# Patient Record
Sex: Male | Born: 1935 | Race: White | Hispanic: No | Marital: Married | Smoking: Former smoker
Health system: Southern US, Community
[De-identification: ages and names within clinical notes are randomized; demographics above are authoritative.]

## PROBLEM LIST (undated history)

## (undated) DIAGNOSIS — K449 Diaphragmatic hernia without obstruction or gangrene: Secondary | ICD-10-CM

## (undated) DIAGNOSIS — E785 Hyperlipidemia, unspecified: Secondary | ICD-10-CM

## (undated) DIAGNOSIS — K644 Residual hemorrhoidal skin tags: Secondary | ICD-10-CM

## (undated) DIAGNOSIS — K5730 Diverticulosis of large intestine without perforation or abscess without bleeding: Secondary | ICD-10-CM

## (undated) DIAGNOSIS — K219 Gastro-esophageal reflux disease without esophagitis: Secondary | ICD-10-CM

## (undated) DIAGNOSIS — K222 Esophageal obstruction: Secondary | ICD-10-CM

## (undated) DIAGNOSIS — I2510 Atherosclerotic heart disease of native coronary artery without angina pectoris: Secondary | ICD-10-CM

## (undated) DIAGNOSIS — I714 Abdominal aortic aneurysm, without rupture, unspecified: Secondary | ICD-10-CM

## (undated) DIAGNOSIS — E039 Hypothyroidism, unspecified: Secondary | ICD-10-CM

## (undated) HISTORY — DX: Atherosclerotic heart disease of native coronary artery without angina pectoris: I25.10

## (undated) HISTORY — PX: APPENDECTOMY: SHX54

## (undated) HISTORY — DX: Esophageal obstruction: K22.2

## (undated) HISTORY — DX: Gastro-esophageal reflux disease without esophagitis: K21.9

## (undated) HISTORY — DX: Abdominal aortic aneurysm, without rupture, unspecified: I71.40

## (undated) HISTORY — PX: ROTATOR CUFF REPAIR: SHX139

## (undated) HISTORY — PX: TOTAL KNEE ARTHROPLASTY: SHX125

## (undated) HISTORY — DX: Residual hemorrhoidal skin tags: K64.4

## (undated) HISTORY — DX: Hyperlipidemia, unspecified: E78.5

## (undated) HISTORY — DX: Hypothyroidism, unspecified: E03.9

## (undated) HISTORY — DX: Abdominal aortic aneurysm, without rupture: I71.4

## (undated) HISTORY — PX: EYE SURGERY: SHX253

## (undated) HISTORY — DX: Diverticulosis of large intestine without perforation or abscess without bleeding: K57.30

## (undated) HISTORY — DX: Diaphragmatic hernia without obstruction or gangrene: K44.9

## (undated) HISTORY — PX: INGUINAL HERNIA REPAIR: SHX194

## (undated) HISTORY — PX: NECK SURGERY: SHX720

## (undated) HISTORY — PX: ABDOMINAL AORTIC ANEURYSM REPAIR: SHX42

---

## 1998-08-07 ENCOUNTER — Encounter: Payer: Self-pay | Admitting: Cardiology

## 1998-08-07 ENCOUNTER — Ambulatory Visit (HOSPITAL_COMMUNITY): Admission: RE | Admit: 1998-08-07 | Discharge: 1998-08-08 | Payer: Self-pay | Admitting: Cardiology

## 1999-10-04 ENCOUNTER — Emergency Department (HOSPITAL_COMMUNITY): Admission: EM | Admit: 1999-10-04 | Discharge: 1999-10-05 | Payer: Self-pay

## 2000-01-04 ENCOUNTER — Emergency Department (HOSPITAL_COMMUNITY): Admission: EM | Admit: 2000-01-04 | Discharge: 2000-01-05 | Payer: Self-pay | Admitting: Emergency Medicine

## 2000-01-05 ENCOUNTER — Inpatient Hospital Stay (HOSPITAL_COMMUNITY): Admission: EM | Admit: 2000-01-05 | Discharge: 2000-01-08 | Payer: Self-pay | Admitting: Emergency Medicine

## 2000-01-06 ENCOUNTER — Encounter: Payer: Self-pay | Admitting: *Deleted

## 2004-06-01 ENCOUNTER — Ambulatory Visit: Payer: Self-pay | Admitting: Internal Medicine

## 2004-06-12 ENCOUNTER — Ambulatory Visit: Payer: Self-pay | Admitting: Internal Medicine

## 2004-06-12 ENCOUNTER — Ambulatory Visit (HOSPITAL_COMMUNITY): Admission: RE | Admit: 2004-06-12 | Discharge: 2004-06-12 | Payer: Self-pay | Admitting: Internal Medicine

## 2004-07-07 ENCOUNTER — Ambulatory Visit: Payer: Self-pay | Admitting: Cardiology

## 2004-11-09 ENCOUNTER — Ambulatory Visit: Payer: Self-pay

## 2005-05-18 ENCOUNTER — Ambulatory Visit: Payer: Self-pay | Admitting: Cardiology

## 2005-07-21 ENCOUNTER — Ambulatory Visit: Payer: Self-pay | Admitting: Cardiology

## 2006-08-01 ENCOUNTER — Ambulatory Visit: Payer: Self-pay | Admitting: Cardiology

## 2007-08-21 ENCOUNTER — Ambulatory Visit: Payer: Self-pay | Admitting: Internal Medicine

## 2007-09-04 ENCOUNTER — Ambulatory Visit: Payer: Self-pay | Admitting: Internal Medicine

## 2007-09-05 ENCOUNTER — Telehealth: Payer: Self-pay | Admitting: Internal Medicine

## 2007-09-25 ENCOUNTER — Encounter: Payer: PRIVATE HEALTH INSURANCE | Admitting: Family Medicine

## 2007-10-13 DIAGNOSIS — E039 Hypothyroidism, unspecified: Secondary | ICD-10-CM | POA: Insufficient documentation

## 2007-10-13 DIAGNOSIS — I2510 Atherosclerotic heart disease of native coronary artery without angina pectoris: Secondary | ICD-10-CM | POA: Insufficient documentation

## 2007-10-13 DIAGNOSIS — E785 Hyperlipidemia, unspecified: Secondary | ICD-10-CM | POA: Insufficient documentation

## 2007-10-13 DIAGNOSIS — K5730 Diverticulosis of large intestine without perforation or abscess without bleeding: Secondary | ICD-10-CM | POA: Insufficient documentation

## 2007-10-13 DIAGNOSIS — Z8673 Personal history of transient ischemic attack (TIA), and cerebral infarction without residual deficits: Secondary | ICD-10-CM | POA: Insufficient documentation

## 2007-10-13 DIAGNOSIS — K219 Gastro-esophageal reflux disease without esophagitis: Secondary | ICD-10-CM

## 2007-10-13 DIAGNOSIS — K222 Esophageal obstruction: Secondary | ICD-10-CM

## 2007-10-13 DIAGNOSIS — Z860100 Personal history of colon polyps, unspecified: Secondary | ICD-10-CM | POA: Insufficient documentation

## 2007-10-13 DIAGNOSIS — K449 Diaphragmatic hernia without obstruction or gangrene: Secondary | ICD-10-CM | POA: Insufficient documentation

## 2007-10-13 DIAGNOSIS — K644 Residual hemorrhoidal skin tags: Secondary | ICD-10-CM | POA: Insufficient documentation

## 2007-10-13 DIAGNOSIS — Z8679 Personal history of other diseases of the circulatory system: Secondary | ICD-10-CM | POA: Insufficient documentation

## 2007-10-17 ENCOUNTER — Ambulatory Visit: Payer: Self-pay | Admitting: Cardiology

## 2007-10-18 ENCOUNTER — Encounter: Payer: PRIVATE HEALTH INSURANCE | Admitting: Family Medicine

## 2009-01-14 ENCOUNTER — Ambulatory Visit: Payer: Self-pay | Admitting: Cardiology

## 2009-05-10 ENCOUNTER — Emergency Department: Payer: Self-pay | Admitting: Emergency Medicine

## 2010-02-02 ENCOUNTER — Ambulatory Visit: Payer: Self-pay | Admitting: Cardiology

## 2010-02-02 ENCOUNTER — Encounter: Payer: Self-pay | Admitting: Cardiology

## 2010-05-19 NOTE — Assessment & Plan Note (Signed)
Summary: f1y   Visit Type:  Follow-up Primary Provider:  Dr Leim Fabry  CC:  no complaints.  History of Present Illness: Patient has been doing pretty well.  He developed prostate cancer in February, he has been followed by the Urology team.  They have recommended a biopsy.  He is getting a PSA about every six months.  In June, he was told he had Barrett's esophagus.  He has had reflux for a long time.  He saw Dr. Joseph Art at [facility].  Not having any chest pain.  He sometimes gets lightheaded.  He fell in their rock garden and fell head first on the concrete.  That occurred in August.  Ended up seeing a neurologist at [facility], and they wanted to evaluate his walking.  He then saw ENT at [facility], and they did some positional manuevers and some of his symptoms have resolved.  Is scheduled for an MRI to evaluate by brain, and also cervical spine.    Current Medications (verified): 1)  Simvastatin 40 Mg  Tabs (Simvastatin) .... Take 1 Tablet By Mouth Once A Day 2)  Amlodipine Besylate 5 Mg  Tabs (Amlodipine Besylate) .... Take 1 Tablet By Mouth Once A Day 3)  Vitamin B-12 1000 Mcg  Tabs (Cyanocobalamin) .... Take 1 Tablet By Mouth Once A Day 4)  Aspirin 81 Mg  Tabs (Aspirin) .... Take 1 Tablet By Mouth Once A Day 5)  Levothyroxine Sodium 112 Mcg Tabs (Levothyroxine Sodium) .... Take 1 Tablet By Mouth Once A Day 6)  Captopril 25 Mg Tabs (Captopril) .... Take 1 Tablet By Mouth Two Times A Day 7)  Folic Acid 1 Mg Tabs (Folic Acid) .... 2.044mcg Daily 8)  Omeprazole 20 Mg Cpdr (Omeprazole) .... Two Times A Day  Allergies (verified): No Known Drug Allergies  Past History:  Past Medical History: Last updated: 12/28/2008 Current Problems:  CORONARY ARTERY DISEASE (ICD-414.00)-Nonobstructuive HYPERLIPIDEMIA (ICD-272.4) ABDOMINAL AORTIC ANEURYSM, HX OF (ICD-V12.50) GERD (ICD-530.81) HYPOTHYROIDISM (ICD-244.9) HIATAL HERNIA (ICD-553.3) HEMORRHOIDS, EXTERNAL (ICD-455.3) DIVERTICULOSIS OF  COLON (ICD-562.10) COLONIC POLYPS, HX OF (ICD-V12.72) Hx of ESOPHAGEAL STRICTURE (ICD-530.3)  Past Surgical History: Last updated: 12/28/2008 Right Inguinal Herniography Bilateral Total Knee Replacement Left Rotator Cuff Repair Lumbar laminectomy Appendectomy Bilateral Cataract surgery AAA Repair  Family History: Last updated: 12/28/2008 Family History of Colon Cancer: Maternal First Cousin Family History of Heart Disease:   Social History: Last updated: 12/28/2008 Patient is a former smoker.  Alcohol Use - yes-on occasion  Vital Signs:  Patient profile:   77 year old male Height:      67 inches Weight:      223 pounds BMI:     35.05 Pulse rate:   60 / minute BP sitting:   128 / 78  (left arm) Cuff size:   large  Vitals Entered By: Hardin Negus, RMA (February 02, 2010 10:50 AM)  Physical Exam  General:  Well developed, well nourished, in no acute distress. Head:  normocephalic and atraumatic Eyes:  PERRLA/EOM intact; conjunctiva and lids normal. Lungs:  Clear bilaterally to auscultation and percussion. Heart:  PMI non displaced.  Normal S1 and S2.  No def murmur.   Abdomen:  Bowel sounds positive; abdomen soft and non-tender without masses, organomegaly, or hernias noted. No hepatosplenomegaly.  No widened pulsation.   Pulses:  pulses normal in all 4 extremities Extremities:  No clubbing or cyanosis. Neurologic:  Alert and oriented x 3.   EKG  Procedure date:  02/02/2010  Findings:  NSR. WNL  Impression & Recommendations:  Problem # 1:  CORONARY ARTERY DISEASE (ICD-414.00) stable at present.  No chest pain.   His updated medication list for this problem includes:    Amlodipine Besylate 5 Mg Tabs (Amlodipine besylate) .Marland Kitchen... Take 1 tablet by mouth once a day    Aspirin 81 Mg Tabs (Aspirin) .Marland Kitchen... Take 1 tablet by mouth once a day    Captopril 25 Mg Tabs (Captopril) .Marland Kitchen... Take 1 tablet by mouth two times a day  Orders: EKG w/ Interpretation  (93000)  His updated medication list for this problem includes:    Amlodipine Besylate 5 Mg Tabs (Amlodipine besylate) .Marland Kitchen... Take 1 tablet by mouth once a day    Aspirin 81 Mg Tabs (Aspirin) .Marland Kitchen... Take 1 tablet by mouth once a day    Captopril 25 Mg Tabs (Captopril) .Marland Kitchen... Take 1 tablet by mouth two times a day  Problem # 2:  HYPERLIPIDEMIA (ICD-272.4) Is on combo of amlodipine and simva.  Has tolerated well.  Will switch to Pravastatin, just like his wife.   Have asked him to go to his primary MD in six weeks for lipid and liver.    Problem # 3:  ABDOMINAL AORTIC ANEURYSM, HX OF (ICD-V12.50) status post closure with stent graft.  Patient Instructions: 1)  Your physician recommends that you return for a FASTING Lipid and Liver Profile in 6 WEEKS with your Primary Care provider.  2)  Your physician has recommended you make the following change in your medication: STOP Simvastatin, START Pravastatin 40mg  one by mouth at bedtime  3)  Your physician wants you to follow-up in:  1 YEAR.  You will receive a reminder letter in the mail two months in advance. If you don't receive a letter, please call our office to schedule the follow-up appointment. Prescriptions: PRAVASTATIN SODIUM 40 MG TABS (PRAVASTATIN SODIUM) Take one tablet by mouth daily at bedtime  #30 x 11   Entered by:   Julieta Gutting, RN, BSN   Authorized by:   Ronaldo Miyamoto, MD, [facility]   Signed by:   Julieta Gutting, RN, BSN on 02/02/2010   Method used:   Electronically to        [facility]  [address] [address] [phone].* (retail)       [address]       [place], [place]  [address]       Ph: [phone] or [phone]       Fax: [phone]   RxID:   [id number]

## 2010-09-01 NOTE — Assessment & Plan Note (Signed)
[facility]                            CARDIOLOGY OFFICE NOTE   Bryan Howard, Bryan Howard                       MRN:          [id number]  DATE:10/17/2007                            DOB:          February 08, 1933    Bryan Howard is in for followup.  Since I last saw him, he has had his CT  followup at [facility], and told that his aneurysm is in good shape.  He has  also had evaluation with Dr. Wonda Cheng at [facility].  Apparently, Dr. Nedra Hai died.  His cholesterol was okay and his thyroid  replacement was reduced from 150 mcg to 125 mcg.  He had an epidural 3  weeks' ago and now is perhaps more ambulatory and has lost 7 pounds on a  diet.   CURRENT MEDICATIONS:  1. Levothroid 125 mcg daily.  2. Folic acid 200 mcg daily.  3. Aspirin 81 mg daily.  4. B12 1000 mcg daily.  5. Cozaar 50 mg 1-1/2 tablet b.i.d.  6. Amlodipine 5 mg daily.  7. Simvastatin 40 mg daily.   PHYSICAL EXAMINATION:  On physical, he is alert and oriented.  The  weight is 224 pounds down 15 pounds from a year ago.  The blood pressure  is 132/82 and  pulse is 68.  The lung fields are clear.  The cardiac  rhythm is regular without significant murmur noted.   LABORATORY DATA:  Electrocardiogram demonstrates normal sinus rhythm  with a rare premature ventricular contraction.   IMPRESSION:  1. Nonobstructive coronary artery disease.  2. Hypercholesterolemia.  3. Status post abdominal aortic aneurysm repair with stent graft.  4. Hypothyroidism.   PLAN:  1. Return to clinic in 1 year.  2. Continue current medical regimen.     Bryan Howard. Riley Kill, MD, [facility]  Electronically Signed    TDS/MedQ  DD: 10/17/2007  DT: 10/17/2007  Job #: [id number]   cc:   Wonda Cheng

## 2010-09-04 NOTE — Assessment & Plan Note (Signed)
[facility]                            CARDIOLOGY OFFICE NOTE   OMARION, MINNEHAN                       MRN:          [id number]  DATE:07/29/2006                            DOB:          1932-09-10    Mr. Critzer is in today for followup. He last underwent exercise  tolerance testing about a year ago. In the interim, he has had followup  of his triple A graft and there has been no evidence of leakage and  appropriate shrinking. The patient last underwent cardiac  catheterization in 2000 and at that time had scattered disease. He has  maintained on a medical regimen in the interim.   His current medications include;  1. Levothroid 100 mcg daily.  2. Folic acid 2000 mg daily.  3. Aspirin 81 mg daily.  4. B12 1000 mcg daily.  5. Cozaar 50 mg daily.  6. Caduet 20/5 daily.   On examination, his weight is 239 pounds, blood pressure 128/80, and the  pulse is 65.  LUNG FIELDS: Clear.  CARDIAC: Rhythm is regular.   The electrocardiogram demonstrates normal sinus rhythm within normal  limits.   IMPRESSION:  1. Coronary artery disease, mild by previous catheterization 2000.  2. Abdominal aortic aneurysm discovered at that time status post      aortic stent graft at [facility] with appropriate follow up.  3. Hypercholesterolemia, managed by Dr. Nedra Hai in [place] with      appropriate follow up.  4. Hypothyroidism on thyroid medication.   RECOMMENDATIONS:  1. Continue current medical regimen.  2. Return to clinic in 1 year.     Arturo Morton. Riley Kill, MD, [facility]  Electronically Signed    TDS/MedQ  DD: 08/08/2006  DT: 08/08/2006  Job #: [id number]

## 2010-09-04 NOTE — Discharge Summary (Signed)
[facility]  Patient:    Bryan Howard, Bryan Howard                       MRN: [id number] Adm. Date:  [id number] Disc. Date: [id number] Attending:  Fritzi Mandes CC:         Jodi Mourning, M.D., [place], [place]  Maisie Fus D. Riley Kill, M.D. [facility]  Tinnie Gens C. Ninetta Lights, M.D.   Discharge Summary  FINAL DIAGNOSES: 1. Cellulitis. 2. Hypertension. 3. Hypothyroidism. 4. Onychomycosis.  HISTORY OF PRESENT ILLNESS:  For details of the History of Present Illness please see my handwritten note dated January 05, 2000.  In brief, Mr. Springborn is a 77 year old man admitted with cellulitis.  He has had left great toe swelling x 36 hours with extension up his leg.  On the night prior to admission he came to the emergency room and was given Rocephin.  However, on this drug his cellulitis worsened, and he had a further extension up his leg. He had a similar problem three months go treated with antibiotics in the emergency room and then antibiotics at home.  He has some onychomycosis of his great toe.  He has also had a chigger bite which for some reason has not healed with this bite being six weeks ago.  HOSPITAL COURSE:  The patient was admitted and given IV Unasyn 1.5 g IV q.6h. We also had him elevated the left lower extremity, gave him Tylenol and Celebrex.  We continued his other home medications and added heparin for DVT prophylaxis.  On this regimen the redness gradually improved and receded.  At the time of discharge he continued to have some redness of the top of his left foot, but there was no extension up his leg, and erythema was much less intense.  We obtained and infectious disease consultation from Dr. Ninetta Lights.  He recommended treatment for the onychomycosis, will do this with terbinafine 250 mg p.o. q.d. x 12 weeks.  Will also have him see podiatry.  LABORATORY AND X-RAY DATA:  Laboratory evaluations during the hospitalization on September 18:  He had a white  count of 11.2, and his CBC was otherwise normal.  BMET on admission was also normal.  Magnification films of the left foot showed no acute bone abnormality with no evidence of fracture, subluxation, or evidence of osteomyelitis.  CONDITION ON DISCHARGE:  Improved.  DISCHARGE INSTRUCTIONS:  He is to keep the foot elevated for several days, to eat healthy, to see Dr. Dan Humphreys for follow-up, and to see a podiatrist to trim his nails.  DISCHARGE MEDICATIONS: 1. Terbinafine 250 mg one p.o. q.d. x 12 weeks. 2. Augmentin 875 mg one p.o. b.i.d. x 14 days. 3. He will continue his usual Synthroid, Norvasc, aspirin, multivitamin,    and glucosamine sulfate.  A final note to Dr. Dan Humphreys:  The patient relates that you are his doctor.  He has every confidence in you, but he does not wish to go to the emergency room in [place].  Accordingly, he came to the ER here in [place] and wound up being admitted by myself on an unassigned basis.  I suggested to him that he talk to you about his concerns about the emergency room in [place], as this might provide better continuity of his medical care there. DD:  01/08/00 TD:  01/10/00 Job: [id number]

## 2011-01-29 ENCOUNTER — Encounter: Payer: Self-pay | Admitting: Cardiology

## 2011-02-01 ENCOUNTER — Other Ambulatory Visit: Payer: Self-pay | Admitting: Cardiology

## 2011-03-05 ENCOUNTER — Encounter: Payer: Self-pay | Admitting: Cardiology

## 2011-03-09 ENCOUNTER — Encounter: Payer: Self-pay | Admitting: *Deleted

## 2011-03-10 ENCOUNTER — Ambulatory Visit (INDEPENDENT_AMBULATORY_CARE_PROVIDER_SITE_OTHER): Payer: Medicare Other | Admitting: Cardiology

## 2011-03-10 ENCOUNTER — Encounter: Payer: Self-pay | Admitting: Cardiology

## 2011-03-10 DIAGNOSIS — E7800 Pure hypercholesterolemia, unspecified: Secondary | ICD-10-CM

## 2011-03-10 DIAGNOSIS — I2510 Atherosclerotic heart disease of native coronary artery without angina pectoris: Secondary | ICD-10-CM

## 2011-03-10 DIAGNOSIS — Z8679 Personal history of other diseases of the circulatory system: Secondary | ICD-10-CM

## 2011-03-10 DIAGNOSIS — E785 Hyperlipidemia, unspecified: Secondary | ICD-10-CM

## 2011-03-10 MED ORDER — PRAVASTATIN SODIUM 80 MG PO TABS: 80.0000 mg | ORAL_TABLET | Freq: Every day | ORAL | Status: DC

## 2011-03-10 NOTE — Assessment & Plan Note (Addendum)
No recurrent angina at the present time.

## 2011-03-10 NOTE — Progress Notes (Signed)
Patient ID: Bryan Howard, male   DOB: Jan 16, 1933, 77 y.o.   MRN: [id number]

## 2011-03-10 NOTE — Progress Notes (Signed)
HPI:  He is doing well.  As he always says,  "outstanding".  He actually has no major complaints.  He is followed at [facility].  His wife does have dementia, and that is creating a lot of stress in his life.  His primary MD thought he might have some dementia.  They have moved him to [place] for follow up of his stent grafts.  No current chest pain.  Last PSA was 5.5.  Being followed in primary care and at [facility].   Current Outpatient Prescriptions  Medication Sig Dispense Refill  . amLODipine (NORVASC) 5 MG tablet Take 5 mg by mouth daily.        Marland Kitchen aspirin 81 MG tablet Take 81 mg by mouth daily.        . captopril (CAPOTEN) 25 MG tablet Take 25 mg by mouth 2 (two) times daily.       . folic acid (FOLVITE) 1 MG tablet Take 1 mg by mouth daily.        Marland Kitchen levothyroxine (SYNTHROID, LEVOTHROID) 112 MCG tablet Take 112 mcg by mouth daily.        Marland Kitchen omeprazole (PRILOSEC) 20 MG capsule Take 1 tablet by mouth Twice daily.      . pravastatin (PRAVACHOL) 80 MG tablet Take 1 tablet (80 mg total) by mouth daily.  90 tablet  3  . vitamin B-12 (CYANOCOBALAMIN) 1000 MCG tablet Take 1,000 mcg by mouth daily.          No Known Allergies  Past Medical History  Diagnosis Date  . Hyperlipidemia   . Coronary artery disease   . Abdominal aortic aneurysm   . GERD (gastroesophageal reflux disease)   . Hypothyroidism   . Hiatal hernia   . Hemorrhoids, external   . Diverticulosis of colon   . Esophageal stricture     Past Surgical History  Procedure Date  . Inguinal hernia repair     Right inguinal herniography  . Total knee arthroplasty     left   . Total knee arthroplasty     right  . Rotator cuff repair     left  . Appendectomy   . Abdominal aortic aneurysm repair   . Eye surgery     bilateral cataract surgery    Family History  Problem Relation Age of Onset  . Colon cancer Cousin     maternal 1st cousin  . Heart disease      History   Social History  . Marital Status: Married   Spouse Name: N/A    Number of Children: N/A  . Years of Education: N/A   Occupational History  . Not on file.   Social History Main Topics  . Smoking status: Former Games developer  . Smokeless tobacco: Not on file  . Alcohol Use: Yes     occasionally  . Drug Use: No  . Sexually Active:    Other Topics Concern  . Not on file   Social History Narrative  . No narrative on file    ROS: Please see the HPI.  All other systems reviewed and negative.  PHYSICAL EXAM:  BP 114/72  Pulse 63  Ht 5\' 6"  (1.676 m)  Wt 106.142 kg (234 lb)  BMI 37.77 kg/m2  General: Well developed, well nourished, in no acute distress.  Moderately obese.   Head:  Normocephalic and atraumatic. Neck: no JVD Lungs: Clear to auscultation and percussion. Heart: Normal S1 and S2.  No murmur, rubs or gallops.  Abdomen:  Normal bowel sounds; soft; non tender; no organomegaly Pulses: Pulses normal in all 4 extremities. Extremities: No clubbing or cyanosis. No edema. Neurologic: Alert and oriented x 3.  EKG:  NSR.  WNL. ASSESSMENT AND PLAN:

## 2011-03-10 NOTE — Patient Instructions (Signed)
Your physician wants you to follow-up in: 1 YEAR.  You will receive a reminder letter in the mail two months in advance. If you don't receive a letter, please call our office to schedule the follow-up appointment.  Your physician has recommended you make the following change in your medication: INCREASE Pravastatin to 80mg  once a day  Your physician recommends that you return for a FASTING lipid and liver profile in 6 WEEKS--nothing to eat or drink after midnight. (order given)

## 2011-03-10 NOTE — Assessment & Plan Note (Signed)
LDL is somewhat up.  Will increase Pravastatin to 80mg  daily, and recommend repeat liver and lipid at 6 weeks with his primary MD.

## 2011-03-10 NOTE — Assessment & Plan Note (Signed)
Regular follow up at [facility] 

## 2011-04-21 ENCOUNTER — Telehealth: Payer: Self-pay | Admitting: Cardiology

## 2011-04-21 NOTE — Telephone Encounter (Signed)
New Msg: Pt calling wanting to discuss with MD, about neurologists at [facility] changing pt medication from pravastatin to atorvastin. Pt would like to discuss this medication change with Dr. Riley Kill. Please return pt call to discuss further.

## 2011-04-21 NOTE — Telephone Encounter (Signed)
Spoke with pt, he was recently in [facility] hosp with a TIA. He reports that while he was there they sent him home on lipitor 40 mg once daily instead of pravastatin. He wanted to let dr Riley Kill know. He has a follow up with his primary care md in one week. His records are being sent to [place]. Will make dr Riley Kill aware.

## 2011-05-03 NOTE — Telephone Encounter (Signed)
Information reviewed.  TS

## 2011-09-08 DIAGNOSIS — G4733 Obstructive sleep apnea (adult) (pediatric): Secondary | ICD-10-CM | POA: Diagnosis present

## 2011-12-14 DIAGNOSIS — Z531 Procedure and treatment not carried out because of patient's decision for reasons of belief and group pressure: Secondary | ICD-10-CM | POA: Insufficient documentation

## 2011-12-28 DIAGNOSIS — E669 Obesity, unspecified: Secondary | ICD-10-CM | POA: Insufficient documentation

## 2011-12-30 DIAGNOSIS — F32A Depression, unspecified: Secondary | ICD-10-CM | POA: Insufficient documentation

## 2012-03-14 ENCOUNTER — Ambulatory Visit: Payer: Medicare Other | Admitting: Cardiology

## 2012-03-27 ENCOUNTER — Ambulatory Visit: Payer: Medicare Other | Admitting: Cardiology

## 2012-03-31 ENCOUNTER — Encounter: Payer: Self-pay | Admitting: Cardiology

## 2012-03-31 ENCOUNTER — Ambulatory Visit (INDEPENDENT_AMBULATORY_CARE_PROVIDER_SITE_OTHER): Payer: Medicare Other | Admitting: Cardiology

## 2012-03-31 VITALS — BP 116/76 | HR 66 | Ht 66.0 in | Wt 231.4 lb

## 2012-03-31 DIAGNOSIS — E785 Hyperlipidemia, unspecified: Secondary | ICD-10-CM

## 2012-03-31 DIAGNOSIS — Z8679 Personal history of other diseases of the circulatory system: Secondary | ICD-10-CM

## 2012-03-31 DIAGNOSIS — I2510 Atherosclerotic heart disease of native coronary artery without angina pectoris: Secondary | ICD-10-CM

## 2012-03-31 NOTE — Patient Instructions (Addendum)
Follow up as needed with [facility] Continue current medications Refer to medication list given to you today

## 2012-03-31 NOTE — Assessment & Plan Note (Signed)
Followed at [facility]  

## 2012-03-31 NOTE — Assessment & Plan Note (Signed)
Followed in [place].

## 2012-03-31 NOTE — Assessment & Plan Note (Signed)
He is stable, and probably only needs gen med follow up at this point.  We will pull his old chart and add that information to his baseline under overview.

## 2012-03-31 NOTE — Progress Notes (Signed)
;   HPI:  Patient seen today in a followup visit. From a cardiac standpoint he is doing extremely well. Last December, patient had double vision as he was walking in to a [employer].  He was taken by his son to [facility], where he had a full workup.  Carotids were normal at that time.  They did not find an immediate source.  From there, he has done well.  He has his primary care in [place] with a [facility] internist.  He has no exertional symptoms.  He has not lost weight unfortunately.    Current Outpatient Prescriptions  Medication Sig Dispense Refill  . amLODipine (NORVASC) 5 MG tablet Take 5 mg by mouth daily.       Marland Kitchen aspirin 81 MG tablet Take 81 mg by mouth daily.        Marland Kitchen atorvastatin (LIPITOR) 80 MG tablet Take 80 mg by mouth daily.       . folic acid (FOLVITE) 1 MG tablet Take 1 mg by mouth daily.        Marland Kitchen levothyroxine (SYNTHROID, LEVOTHROID) 112 MCG tablet Take 112 mcg by mouth daily.        Marland Kitchen losartan (COZAAR) 25 MG tablet Take 25 mg by mouth 2 (two) times daily.       Marland Kitchen omeprazole (PRILOSEC) 20 MG capsule Take 1 tablet by mouth Twice daily.      . sertraline (ZOLOFT) 50 MG tablet Take 50 mg by mouth daily.       . Tamsulosin HCl (FLOMAX) 0.4 MG CAPS 0.4 mg daily.       . vitamin B-12 (CYANOCOBALAMIN) 1000 MCG tablet Take 1,000 mcg by mouth daily.          No Known Allergies  Past Medical History  Diagnosis Date  . Hyperlipidemia   . Coronary artery disease   . Abdominal aortic aneurysm   . GERD (gastroesophageal reflux disease)   . Hypothyroidism   . Hiatal hernia   . Hemorrhoids, external   . Diverticulosis of colon   . Esophageal stricture     Past Surgical History  Procedure Date  . Inguinal hernia repair     Right inguinal herniography  . Total knee arthroplasty     left   . Total knee arthroplasty     right  . Rotator cuff repair     left  . Appendectomy   . Abdominal aortic aneurysm repair   . Eye surgery     bilateral cataract surgery    Family History    Problem Relation Age of Onset  . Colon cancer Cousin     maternal 1st cousin  . Heart disease      History   Social History  . Marital Status: Married    Spouse Name: N/A    Number of Children: N/A  . Years of Education: N/A   Occupational History  . Not on file.   Social History Main Topics  . Smoking status: Former Games developer  . Smokeless tobacco: Not on file  . Alcohol Use: Yes     Comment: occasionally  . Drug Use: No  . Sexually Active:    Other Topics Concern  . Not on file   Social History Narrative  . No narrative on file    ROS: Please see the HPI.  All other systems reviewed and negative.  PHYSICAL EXAM:  BP 116/76  Pulse 66  Ht 5\' 6"  (1.676 m)  Wt 231 lb 6.4 oz (  104.962 kg)  BMI 37.35 kg/m2  SpO2 93%  General: Well developed, well nourished, in no acute distress. Head:  Normocephalic and atraumatic. Neck: no JVD.  No carotid bruits.   Lungs: Clear to auscultation and percussion. Heart: Normal S1 and S2.  1/6 SEM.  No DM.   Abdomen:  Normal bowel sounds; soft; non tender; no organomegaly Pulses: Pulses normal in all 4 extremities. Extremities: No clubbing or cyanosis. No edema. Neurologic: Alert and oriented x 3.  EKG:  NSR. WNL.    ASSESSMENT AND PLAN:

## 2012-09-20 ENCOUNTER — Encounter: Payer: Self-pay | Admitting: Cardiology

## 2014-01-11 ENCOUNTER — Encounter: Payer: Self-pay | Admitting: Internal Medicine

## 2014-10-26 ENCOUNTER — Encounter: Payer: Self-pay | Admitting: General Practice

## 2014-10-26 ENCOUNTER — Other Ambulatory Visit: Payer: Self-pay

## 2014-10-26 ENCOUNTER — Emergency Department: Payer: Medicare Other

## 2014-10-26 ENCOUNTER — Emergency Department
Admission: EM | Admit: 2014-10-26 | Discharge: 2014-10-26 | Disposition: A | Discharge status: Home / Self Care | Payer: Medicare Other | Attending: Emergency Medicine | Admitting: Emergency Medicine

## 2014-10-26 DIAGNOSIS — R5383 Other fatigue: Secondary | ICD-10-CM | POA: Diagnosis not present

## 2014-10-26 DIAGNOSIS — R200 Anesthesia of skin: Secondary | ICD-10-CM | POA: Diagnosis not present

## 2014-10-26 DIAGNOSIS — R29898 Other symptoms and signs involving the musculoskeletal system: Secondary | ICD-10-CM

## 2014-10-26 DIAGNOSIS — M549 Dorsalgia, unspecified: Secondary | ICD-10-CM | POA: Diagnosis not present

## 2014-10-26 DIAGNOSIS — M6281 Muscle weakness (generalized): Secondary | ICD-10-CM | POA: Insufficient documentation

## 2014-10-26 DIAGNOSIS — R531 Weakness: Secondary | ICD-10-CM | POA: Diagnosis present

## 2014-10-26 DIAGNOSIS — G8929 Other chronic pain: Secondary | ICD-10-CM | POA: Diagnosis not present

## 2014-10-26 DIAGNOSIS — Z87891 Personal history of nicotine dependence: Secondary | ICD-10-CM | POA: Insufficient documentation

## 2014-10-26 LAB — CBC
HEMATOCRIT: 38.9 % — AB (ref 40.0–52.0)
HEMOGLOBIN: 13 g/dL (ref 13.0–18.0)
MCH: 33.2 pg (ref 26.0–34.0)
MCHC: 33.5 g/dL (ref 32.0–36.0)
MCV: 99.2 fL (ref 80.0–100.0)
Platelets: 196 10*3/uL (ref 150–440)
RBC: 3.93 MIL/uL — AB (ref 4.40–5.90)
RDW: 14.7 % — ABNORMAL HIGH (ref 11.5–14.5)
WBC: 7.3 10*3/uL (ref 3.8–10.6)

## 2014-10-26 LAB — BASIC METABOLIC PANEL
ANION GAP: 9 (ref 5–15)
BUN: 14 mg/dL (ref 6–20)
CHLORIDE: 104 mmol/L (ref 101–111)
CO2: 26 mmol/L (ref 22–32)
Calcium: 9 mg/dL (ref 8.9–10.3)
Creatinine, Ser: 0.78 mg/dL (ref 0.61–1.24)
Glucose, Bld: 118 mg/dL — ABNORMAL HIGH (ref 65–99)
POTASSIUM: 3.7 mmol/L (ref 3.5–5.1)
Sodium: 139 mmol/L (ref 135–145)

## 2014-10-26 LAB — URINALYSIS COMPLETE WITH MICROSCOPIC (ARMC ONLY)
Bacteria, UA: NONE SEEN
Bilirubin Urine: NEGATIVE
Glucose, UA: NEGATIVE mg/dL
Hgb urine dipstick: NEGATIVE
Ketones, ur: NEGATIVE mg/dL
Leukocytes, UA: NEGATIVE
Nitrite: NEGATIVE
PH: 7 (ref 5.0–8.0)
PROTEIN: NEGATIVE mg/dL
Specific Gravity, Urine: 1.018 (ref 1.005–1.030)

## 2014-10-26 LAB — TROPONIN I: Troponin I: <0.03 ng/mL (ref <0.031)

## 2014-10-26 NOTE — ED Provider Notes (Signed)
[facility] Emergency Department Provider Note   ____________________________________________  Time seen: 3:30 PM I have reviewed the triage vital signs and the triage nursing note.  HISTORY  Chief Complaint Weakness and Fatigue   Historian Patient  HPI Bryan CoganLloyd N Trebilcock Jr. is a 81 y.o. male who is here for evaluation of right leg dragging when he walks. He states this has been going on for about 4 days. He goes back even further and states that he has been feeling generalized fatigue for about 3-4 weeks including episodes of vertigo/vestibular disease per the patient. He is followed bya primary care physician Dr. Dayna BarkerAldridge, and sees a spine physician at [facility], and has a neurologist as well. He denies chest pain or shortness of breath or any recent sinus congestion or fevers. He states he has a chronic cough without sputum production. No urinary symptoms. No abdominal pain.    Past Medical History  Diagnosis Date  . Hyperlipidemia   . Coronary artery disease   . Abdominal aortic aneurysm   . GERD (gastroesophageal reflux disease)   . Hypothyroidism   . Hiatal hernia   . Hemorrhoids, external   . Diverticulosis of colon   . Esophageal stricture     Patient Active Problem List   Diagnosis Date Noted  . HYPOTHYROIDISM 10/13/2007  . HYPERLIPIDEMIA 10/13/2007  . CORONARY ARTERY DISEASE 10/13/2007  . HEMORRHOIDS, EXTERNAL 10/13/2007  . ESOPHAGEAL STRICTURE 10/13/2007  . GERD 10/13/2007  . HIATAL HERNIA 10/13/2007  . DIVERTICULOSIS OF COLON 10/13/2007  . ABDOMINAL AORTIC ANEURYSM, HX OF 10/13/2007  . COLONIC POLYPS, HX OF 10/13/2007    Past Surgical History  Procedure Laterality Date  . Inguinal hernia repair      Right inguinal herniography  . Total knee arthroplasty      left   . Total knee arthroplasty      right  . Rotator cuff repair      left  . Appendectomy    . Abdominal aortic aneurysm repair    . Eye surgery      bilateral cataract  surgery    Current Outpatient Rx  Name  Route  Sig  Dispense  Refill  . amLODipine (NORVASC) 5 MG tablet   Oral   Take 5 mg by mouth daily.          Marland Kitchen. aspirin 81 MG tablet   Oral   Take 81 mg by mouth daily.           Marland Kitchen. atorvastatin (LIPITOR) 80 MG tablet   Oral   Take 80 mg by mouth daily.          . folic acid (FOLVITE) 1 MG tablet   Oral   Take 1 mg by mouth daily.           Marland Kitchen. levothyroxine (SYNTHROID, LEVOTHROID) 112 MCG tablet   Oral   Take 112 mcg by mouth daily.           Marland Kitchen. losartan (COZAAR) 25 MG tablet   Oral   Take 25 mg by mouth 2 (two) times daily.          Marland Kitchen. omeprazole (PRILOSEC) 20 MG capsule   Oral   Take 1 tablet by mouth Twice daily.         . sertraline (ZOLOFT) 50 MG tablet   Oral   Take 50 mg by mouth daily.          . Tamsulosin HCl (FLOMAX) 0.4 MG CAPS  0.4 mg daily.          . vitamin B-12 (CYANOCOBALAMIN) 1000 MCG tablet   Oral   Take 1,000 mcg by mouth daily.             Allergies Review of patient's allergies indicates no known allergies.  Family History  Problem Relation Age of Onset  . Colon cancer Cousin     maternal 1st cousin  . Heart disease      Social History History  Substance Use Topics  . Smoking status: Former Games developer  . Smokeless tobacco: Not on file  . Alcohol Use: No     Comment: occasionally    Review of Systems  Constitutional: Negative for fever. Eyes: Negative for visual changes. ENT: Negative for sore throat. Cardiovascular: Negative for chest pain. Respiratory: Negative for shortness of breath. Gastrointestinal: Negative for abdominal pain, vomiting and diarrhea. Genitourinary: Negative for dysuria. Musculoskeletal: Positive for chronic back pain. Skin: Negative for rash. Neurological: Negative for headache. 10 point Review of Systems otherwise negative ____________________________________________   PHYSICAL EXAM:  VITAL SIGNS: ED Triage Vitals  Enc Vitals Group      BP 10/26/14 1326 117/75 mmHg     Pulse Rate 10/26/14 1326 96     Resp --      Temp 10/26/14 1326 98.1 F (36.7 C)     Temp Source 10/26/14 1326 Oral     SpO2 10/26/14 1326 95 %     Weight 10/26/14 1326 220 lb (99.791 kg)     Height 10/26/14 1326  (1.676 m)     Head Cir --      Peak Flow --      Pain Score 10/26/14 1326 9     Pain Loc --      Pain Edu? --      Excl. in GC? --      Constitutional: Alert and oriented. Well appearing and in no distress. Eyes: Conjunctivae are normal. PERRL. Normal extraocular movements. ENT   Head: Normocephalic and atraumatic.   Nose: No congestion/rhinnorhea.   Mouth/Throat: Mucous membranes are moist.   Neck: No stridor. Cardiovascular/Chest: Normal rate, regular rhythm.  No murmurs, rubs, or gallops. Respiratory: Normal respiratory effort without tachypnea nor retractions. Breath sounds are clear and equal bilaterally. No wheezes/rales/rhonchi. Gastrointestinal: Soft. No distention, no guarding, no rebound. Obese. Nontender.  Genitourinary/rectal:Deferred Musculoskeletal: Nontender with normal range of motion in all extremities. No joint effusions.  No lower extremity tenderness nor edema. Neurologic:  Normal speech and language. 5 Out of 5 strength in bilateral upper extremities. 4 out of 5 strength in bilateral lower extremities.. Cranial nerves II through X are intact. Sensation normal except for paresthesia of the right lower extremity Skin:  Skin is warm, dry and intact. No rash noted. Psychiatric: Mood and affect are normal. Speech and behavior are normal. Patient exhibits appropriate insight and judgment.  ____________________________________________   EKG I, Governor Rooks, MD, the attending physician have personally viewed and interpreted all ECGs.  Normal sinus rhythm. 93 bpm. Narrow QRS. Normal axis. Normal ST and T-wave. ____________________________________________  LABS (pertinent positives/negatives)  CBC  within normal limits Metabolic panel within normal limits Troponin less than 0.03 Urinalysis negative  ____________________________________________  RADIOLOGY All Xrays were viewed by me. Imaging interpreted by Radiologist.  CT head noncontrast: Negative __________________________________________  PROCEDURES  Procedure(s) performed: None Critical Care performed: None  ____________________________________________   ED COURSE / ASSESSMENT AND PLAN  CONSULTATIONS: None  Pertinent labs & imaging results that were  available during my care of the patient were reviewed by me and considered in my medical decision making (see chart for details).   Patient is complaining of several weeks of generalized complaints including fatigue and lower extremity weakness including episodes of vertigo. On exam he does have some weakness of both legs but paracentesis to just the right lower extremity. I evaluated with a CT scan there is no evidence of intracranial abnormality including a stroke. His symptoms have been ongoing at least for the past 4-5 days if not longer up to about 4 weeks ago. His laboratory evaluation is reassuring. He is able to walk with his walker which is his baseline. I am going to go ahead and discharge him home. He understands return precautions and is to follow with his primary care physician next week. He is going try to get an appointment with his neurologist sooner than his September appointment.  Patient / Family / Caregiver informed of clinical course, medical decision-making process, and agree with plan.   I discussed return precautions, follow-up instructions, and discharged instructions with patient and/or family.  ___________________________________________   FINAL CLINICAL IMPRESSION(S) / ED DIAGNOSES   Final diagnoses:  Numbness in right leg  Bilateral leg weakness    FOLLOW UP  Referred to: Primary care physician Dr. Delorse Limber, and his  neurologist.   Governor Rooks, MD 10/26/14 [phone]

## 2014-10-26 NOTE — Discharge Instructions (Signed)
No certain cause was found for your fatigue, and bilateral leg weakness, however your exam and evaluation are reassuring. We discussed you should try to get appointment with your neurologist for next available appointment. Follow up with your primary care doctor this week. Return to the emergency department for any new or worsening condition including any confusion or altered mental status, chest pain, shortness of breath, one-sided weakness, or any new numbness, or slurred speech.

## 2014-10-26 NOTE — ED Notes (Signed)
Pt sleeping soundly.

## 2014-10-26 NOTE — ED Notes (Signed)
Pt arrived to ed from home with reports of experiencing bodyaches and generalized weakness x 3-4 weeks. Pt verbalized increase stress at home. Alert and Oriented. Denies specific pain at this time.  Pt denies injuries.

## 2015-07-08 ENCOUNTER — Encounter: Payer: Self-pay | Admitting: Podiatry

## 2015-07-08 ENCOUNTER — Ambulatory Visit (INDEPENDENT_AMBULATORY_CARE_PROVIDER_SITE_OTHER): Payer: Medicare Other | Admitting: Podiatry

## 2015-07-08 ENCOUNTER — Ambulatory Visit (INDEPENDENT_AMBULATORY_CARE_PROVIDER_SITE_OTHER): Payer: Medicare Other

## 2015-07-08 VITALS — BP 129/78 | HR 94 | Resp 18

## 2015-07-08 DIAGNOSIS — M779 Enthesopathy, unspecified: Secondary | ICD-10-CM | POA: Diagnosis not present

## 2015-07-08 DIAGNOSIS — G629 Polyneuropathy, unspecified: Secondary | ICD-10-CM | POA: Diagnosis not present

## 2015-07-08 DIAGNOSIS — M79676 Pain in unspecified toe(s): Secondary | ICD-10-CM | POA: Diagnosis not present

## 2015-07-08 DIAGNOSIS — R52 Pain, unspecified: Secondary | ICD-10-CM | POA: Diagnosis not present

## 2015-07-08 DIAGNOSIS — B351 Tinea unguium: Secondary | ICD-10-CM | POA: Diagnosis not present

## 2015-07-08 NOTE — Patient Instructions (Signed)
If the pain to your feet is not resolved in 3-4 weeks, please call the office for follow-up. Otherwise, I will see you back in 3 months for nail trimming.  It was nice to meet you today. If you have any questions, please feel free to contact me.   Peripheral Neuropathy Peripheral neuropathy is a type of nerve damage. It affects nerves that carry signals between the spinal cord and other parts of the body. These are called peripheral nerves. With peripheral neuropathy, one nerve or a group of nerves may be damaged.  CAUSES  Many things can damage peripheral nerves. For some people with peripheral neuropathy, the cause is unknown. Some causes include:  Diabetes. This is the most common cause of peripheral neuropathy.  Injury to a nerve.  Pressure or stress on a nerve that lasts a long time.  Too little vitamin B. Alcoholism can lead to this.  Infections.  Autoimmune diseases, such as multiple sclerosis and systemic lupus erythematosus.  Inherited nerve diseases.  Some medicines, such as cancer drugs.  Toxic substances, such as lead and mercury.  Too little blood flowing to the legs.  Kidney disease.  Thyroid disease. SIGNS AND SYMPTOMS  Different people have different symptoms. The symptoms you have will depend on which of your nerves is damaged. Common symptoms include:  Loss of feeling (numbness) in the feet and hands.  Tingling in the feet and hands.  Pain that burns.  Very sensitive skin.  Weakness.  Not being able to move a part of the body (paralysis).  Muscle twitching.  Clumsiness or poor coordination.  Loss of balance.  Not being able to control your bladder.  Feeling dizzy.  Sexual problems. DIAGNOSIS  Peripheral neuropathy is a symptom, not a disease. Finding the cause of peripheral neuropathy can be hard. To figure that out, your health care provider will take a medical history and do a physical exam. A neurological exam will also be done. This  involves checking things affected by your brain, spinal cord, and nerves (nervous system). For example, your health care provider will check your reflexes, how you move, and what you can feel.  Other types of tests may also be ordered, such as:  Blood tests.  A test of the fluid in your spinal cord.  Imaging tests, such as CT scans or an MRI.  Electromyography (EMG). This test checks the nerves that control muscles.  Nerve conduction velocity tests. These tests check how fast messages pass through your nerves.  Nerve biopsy. A small piece of nerve is removed. It is then checked under a microscope. TREATMENT   Medicine is often used to treat peripheral neuropathy. Medicines may include:  Pain-relieving medicines. Prescription or over-the-counter medicine may be suggested.  Antiseizure medicine. This may be used for pain.  Antidepressants. These also may help ease pain from neuropathy.  Lidocaine. This is a numbing medicine. You might wear a patch or be given a shot.  Mexiletine. This medicine is typically used to help control irregular heart rhythms.  Surgery. Surgery may be needed to relieve pressure on a nerve or to destroy a nerve that is causing pain.  Physical therapy to help movement.  Assistive devices to help movement. HOME CARE INSTRUCTIONS   Only take over-the-counter or prescription medicines as directed by your health care provider. Follow the instructions carefully for any given medicines. Do not take any other medicines without first getting approval from your health care provider.  If you have diabetes, work closely with  your health care provider to keep your blood sugar under control.  If you have numbness in your feet:  Check every day for signs of injury or infection. Watch for redness, warmth, and swelling.  Wear padded socks and comfortable shoes. These help protect your feet.  Do not do things that put pressure on your damaged nerve.  Do not smoke.  Smoking keeps blood from getting to damaged nerves.  Avoid or limit alcohol. Too much alcohol can cause a lack of B vitamins. These vitamins are needed for healthy nerves.  Develop a good support system. Coping with peripheral neuropathy can be stressful. Talk to a mental health specialist or join a support group if you are struggling.  Follow up with your health care provider as directed. SEEK MEDICAL CARE IF:   You have new signs or symptoms of peripheral neuropathy.  You are struggling emotionally from dealing with peripheral neuropathy.  You have a fever. SEEK IMMEDIATE MEDICAL CARE IF:   You have an injury or infection that is not healing.  You feel very dizzy or begin vomiting.  You have chest pain.  You have trouble breathing.   This information is not intended to replace advice given to you by your health care provider. Make sure you discuss any questions you have with your health care provider.   Document Released: 03/26/2002 Document Revised: 12/16/2010 Document Reviewed: 12/11/2012 Elsevier Interactive Patient Education [employer]

## 2015-07-08 NOTE — Progress Notes (Signed)
   Subjective:    Patient ID: Bryan CoganLloyd N Cappucci Jr., male    DOB: 11-01-1932, 82 y.o.   MRN: [id number]  HPI  82 year old male presents the office today for concerns of painful, elongated, thickened toenails which she cannot trim himself. Denies any swelling redness or drainage. He is also starting to have pain in the outside part of both of his feet of the last couple weeks. This pain started after he started on physical therapy. He has not done much walking over the last year as his back surgeons. As he started increase his activities were to have some pain. No recent injury or trauma. Gets some occasional swelling to his feet. He also has neuropathy. No other complaints.    Review of Systems  All other systems reviewed and are negative.      Objective:   Physical Exam General: AAO x3, NAD  Dermatological: Nails hypertrophic, dystrophic, brittle, discolored, elongated 10. There is no swelling erythema or drainage. There is tenderness nails 1-5 bilaterally. There is dry xerotic skin bilaterally.  Vascular: Dorsalis Pedis artery and Posterior Tibial artery pedal pulses are 2/4 bilateral with immedate capillary fill time. Pedal hair growth present. No varicosities and no lower extremity edema present bilateral. There is no pain with calf compression, swelling, warmth, erythema.   Neruologic:Sensation decreased with Dorann OuSimms Weinstein monofilament, decreased vibratory sensation.  Musculoskeletal: There is tenderness on the course the peroneal tendons just proximal to the fifth metatarsal base and inferior to the lateral malleolus bilaterally. Tendons appear to be intact. No areas of pinpoint bony tenderness. Ankle, subtalar joint range of motion intact.  Gait: Unassisted, Nonantalgic.      Assessment & Plan:  82 year old male with symptomatic onychomycosis, tendinitis -Treatment options discussed including all alternatives, risks, and complications -X-rays were obtained and reviewed with the  patient. No evidence of acute fracture.  -Nails debrided 10 without complications or bleeding -PCP for swelling.  -Provided prescription for physical therapy. I with the patient and he is getting is due to increased activity. Starting therapy with last couple weeks. Start rehabilitation and strengthening exercises. -Follow up in 3-4 weeks of the tendinitis continues to be bothersome or sooner if any issues are to arise. Otherwise follow-up in 3 months.  Ovid CurdMatthew Derrica Sieg, DPM

## 2015-10-09 ENCOUNTER — Encounter: Payer: Self-pay | Admitting: Podiatry

## 2015-10-09 ENCOUNTER — Ambulatory Visit (INDEPENDENT_AMBULATORY_CARE_PROVIDER_SITE_OTHER): Payer: Medicare Other | Admitting: Podiatry

## 2015-10-09 DIAGNOSIS — B351 Tinea unguium: Secondary | ICD-10-CM | POA: Diagnosis not present

## 2015-10-09 DIAGNOSIS — M79676 Pain in unspecified toe(s): Secondary | ICD-10-CM

## 2015-10-09 NOTE — Progress Notes (Signed)
Patient ID: Bryan CoganLloyd N Whistler Jr., male   DOB: 04/30/1932, 82 y.o.   MRN: [id number]  Subjective: 82 y.o. returns the office today for painful, elongated, thickened toenails which he cannot trim himself. Denies any redness or drainage around the nails. Denies any acute changes since last appointment and no new complaints today. States the tendonitis he had before has resolved. Denies any systemic complaints such as fevers, chills, nausea, vomiting.   Objective: AAO 3, NAD DP/PT pulses palpable, CRT less than 3 seconds Nails hypertrophic, dystrophic, elongated, brittle, discolored 10. There is tenderness overlying the nails 1-5 bilaterally. There is no surrounding erythema or drainage along the nail sites. No open lesions or pre-ulcerative lesions are identified. No other areas of tenderness bilateral lower extremities. No overlying edema, erythema, increased warmth. No pain with calf compression, swelling, warmth, erythema.  Assessment: Patient presents with symptomatic onychomycosis  Plan: -Treatment options including alternatives, risks, complications were discussed -Nails sharply debrided 10 without complication/bleeding. -Discussed daily foot inspection. If there are any changes, to call the office immediately.  -Follow-up in 3 months or sooner if any problems are to arise. In the meantime, encouraged to call the office with any questions, concerns, changes symptoms.  Bryan CurdMatthew Linzy Howard, DPM

## 2016-01-13 ENCOUNTER — Ambulatory Visit: Payer: Medicare Other | Admitting: Podiatry

## 2016-01-23 ENCOUNTER — Ambulatory Visit (INDEPENDENT_AMBULATORY_CARE_PROVIDER_SITE_OTHER): Payer: Medicare Other | Admitting: Podiatry

## 2016-01-23 ENCOUNTER — Encounter: Payer: Self-pay | Admitting: Podiatry

## 2016-01-23 DIAGNOSIS — M79609 Pain in unspecified limb: Principal | ICD-10-CM

## 2016-01-23 DIAGNOSIS — L608 Other nail disorders: Secondary | ICD-10-CM

## 2016-01-23 DIAGNOSIS — M79676 Pain in unspecified toe(s): Secondary | ICD-10-CM

## 2016-01-23 DIAGNOSIS — B351 Tinea unguium: Secondary | ICD-10-CM

## 2016-01-23 DIAGNOSIS — G609 Hereditary and idiopathic neuropathy, unspecified: Secondary | ICD-10-CM | POA: Diagnosis not present

## 2016-01-23 DIAGNOSIS — L603 Nail dystrophy: Secondary | ICD-10-CM

## 2016-01-24 NOTE — Progress Notes (Signed)
SUBJECTIVE Patient  presents to office today complaining of elongated, thickened nails. Pain while ambulating in shoes. Patient is unable to trim their own nails.  Patient also has a complaint of neuropathy to the bilateral lower extremities which create burning sensations especially at night.  OBJECTIVE General Patient is awake, alert, and oriented x 3 and in no acute distress. Derm Skin is dry and supple bilateral. Negative open lesions or macerations. Remaining integument unremarkable. Nails are tender, long, thickened and dystrophic with subungual debris, consistent with onychomycosis, 1-5 bilateral. No signs of infection noted. Vasc  DP and PT pedal pulses palpable bilaterally. Temperature gradient within normal limits.  Neuro Epicritic and protective threshold sensation diminished bilaterally.  Musculoskeletal Exam No symptomatic pedal deformities noted bilateral. Muscular strength within normal limits.  ASSESSMENT 1. Onychodystrophic nails 1-5 bilateral with hyperkeratosis of nails.  2. Onychomycosis of nail due to dermatophyte bilateral 3. Pain in foot bilateral 4. 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  PLAN OF CARE 1. Patient evaluated today.  2. Instructed to maintain good pedal hygiene and foot care.  3. Mechanical debridement of nails 1-5 bilaterally performed using a nail nipper. Filed with dremel without incident.  4. Today prescription for anti-inflammatory neuropathic cream was dispensed through [facility]. Return to clinic in 3 mos.    Felecia ShellingBrent M Alma Mohiuddin, DPM

## 2016-03-19 DIAGNOSIS — K8050 Calculus of bile duct without cholangitis or cholecystitis without obstruction: Secondary | ICD-10-CM | POA: Insufficient documentation

## 2016-04-27 ENCOUNTER — Ambulatory Visit: Payer: Medicare Other | Admitting: Podiatry

## 2016-06-18 DIAGNOSIS — M7062 Trochanteric bursitis, left hip: Secondary | ICD-10-CM | POA: Insufficient documentation

## 2016-06-18 DIAGNOSIS — M7061 Trochanteric bursitis, right hip: Secondary | ICD-10-CM | POA: Insufficient documentation

## 2016-06-21 ENCOUNTER — Ambulatory Visit (INDEPENDENT_AMBULATORY_CARE_PROVIDER_SITE_OTHER): Payer: Medicare Other | Admitting: Podiatry

## 2016-06-21 ENCOUNTER — Encounter: Payer: Self-pay | Admitting: Podiatry

## 2016-06-21 DIAGNOSIS — M79609 Pain in unspecified limb: Secondary | ICD-10-CM

## 2016-06-21 DIAGNOSIS — B351 Tinea unguium: Secondary | ICD-10-CM | POA: Diagnosis not present

## 2016-06-21 NOTE — Progress Notes (Signed)
Complaint:  Visit Type: Patient returns to my office for continued preventative foot care services. Complaint: Patient states" my nails have grown long and thick and become painful to walk and wear shoes" . The patient presents for preventative foot care services. No changes to ROS  Podiatric Exam: Vascular: dorsalis pedis and posterior tibial pulses are palpable bilateral. Capillary return is immediate. Temperature gradient is WNL. Skin turgor WNL  Sensorium: Normal Semmes Weinstein monofilament test. Normal tactile sensation bilaterally. Nail Exam: Pt has thick disfigured discolored nails with subungual debris noted bilateral entire nail hallux through fifth toenails Ulcer Exam: There is no evidence of ulcer or pre-ulcerative changes or infection. Orthopedic Exam: Muscle tone and strength are WNL. No limitations in general ROM. No crepitus or effusions noted. Foot type and digits show no abnormalities. Bony prominences are unremarkable. Skin: No Porokeratosis. No infection or ulcers  Diagnosis:  Onychomycosis, , Pain in right toe, pain in left toes  Treatment & Plan Procedures and Treatment: Consent by patient was obtained for treatment procedures. The patient understood the discussion of treatment and procedures well. All questions were answered thoroughly reviewed. Debridement of mycotic and hypertrophic toenails, 1 through 5 bilateral and clearing of subungual debris. No ulceration, no infection noted.  Return Visit-Office Procedure: Patient instructed to return to the office for a follow up visit 3 months   for continued evaluation and treatment.    Shaunak Kreis DPM 

## 2016-06-22 DIAGNOSIS — M5416 Radiculopathy, lumbar region: Secondary | ICD-10-CM | POA: Insufficient documentation

## 2016-07-16 DIAGNOSIS — M1611 Unilateral primary osteoarthritis, right hip: Secondary | ICD-10-CM | POA: Insufficient documentation

## 2016-08-20 DIAGNOSIS — M6281 Muscle weakness (generalized): Secondary | ICD-10-CM | POA: Insufficient documentation

## 2016-09-27 ENCOUNTER — Ambulatory Visit (INDEPENDENT_AMBULATORY_CARE_PROVIDER_SITE_OTHER): Payer: Medicare Other | Admitting: Podiatry

## 2016-09-27 ENCOUNTER — Encounter: Payer: Self-pay | Admitting: Podiatry

## 2016-09-27 DIAGNOSIS — B351 Tinea unguium: Secondary | ICD-10-CM

## 2016-09-27 DIAGNOSIS — M79609 Pain in unspecified limb: Secondary | ICD-10-CM | POA: Diagnosis not present

## 2016-09-27 DIAGNOSIS — G609 Hereditary and idiopathic neuropathy, unspecified: Secondary | ICD-10-CM

## 2016-09-27 NOTE — Progress Notes (Signed)
Complaint:  Visit Type: Patient returns to my office for continued preventative foot care services. Complaint: Patient states" my nails have grown long and thick and become painful to walk and wear shoes.   The patient presents for preventative foot care services. No changes to ROS.  Patient says he experiences numbness extending from ankles to toes both feet.  Podiatric Exam: Vascular: dorsalis pedis and posterior tibial pulses are palpable bilateral. Capillary return is immediate. Temperature gradient is WNL. Skin turgor WNL  Sensorium: Absent  Semmes Weinstein monofilament test. Normal tactile sensation bilaterally. Nail Exam: Pt has thick disfigured discolored nails with subungual debris noted bilateral entire nail hallux through fifth toenails Ulcer Exam: There is no evidence of ulcer or pre-ulcerative changes or infection. Orthopedic Exam: Muscle tone and strength are WNL. No limitations in general ROM. No crepitus or effusions noted. Foot type and digits show no abnormalities. Bony prominences are unremarkable. Skin: No Porokeratosis. No infection or ulcers  Diagnosis:  Onychomycosis, , Pain in right toe, pain in left toes Neuropathy.   Treatment & Plan Procedures and Treatment: Consent by patient was obtained for treatment procedures. The patient understood the discussion of treatment and procedures well. All questions were answered thoroughly reviewed. Debridement of mycotic and hypertrophic toenails, 1 through 5 bilateral and clearing of subungual debris. No ulceration, no infection noted. Told him to discuss neuropathy treatment with his medical doctor. Return Visit-Office Procedure: Patient instructed to return to the office for a follow up visit 3 months for continued evaluation and treatment.    Helane GuntherGregory Kihanna Kamiya DPM

## 2016-09-29 DIAGNOSIS — H903 Sensorineural hearing loss, bilateral: Secondary | ICD-10-CM | POA: Insufficient documentation

## 2016-09-29 DIAGNOSIS — H905 Unspecified sensorineural hearing loss: Secondary | ICD-10-CM | POA: Insufficient documentation

## 2016-09-29 DIAGNOSIS — R42 Dizziness and giddiness: Secondary | ICD-10-CM | POA: Insufficient documentation

## 2017-01-03 ENCOUNTER — Encounter: Payer: Self-pay | Admitting: Podiatry

## 2017-01-03 ENCOUNTER — Ambulatory Visit (INDEPENDENT_AMBULATORY_CARE_PROVIDER_SITE_OTHER): Payer: Medicare Other | Admitting: Podiatry

## 2017-01-03 DIAGNOSIS — M79609 Pain in unspecified limb: Secondary | ICD-10-CM | POA: Diagnosis not present

## 2017-01-03 DIAGNOSIS — B351 Tinea unguium: Secondary | ICD-10-CM

## 2017-01-03 DIAGNOSIS — G609 Hereditary and idiopathic neuropathy, unspecified: Secondary | ICD-10-CM

## 2017-01-03 NOTE — Progress Notes (Signed)
Complaint:  Visit Type: Patient returns to my office for continued preventative foot care services. Complaint: Patient states" my nails have grown long and thick and become painful to walk and wear shoes.   The patient presents for preventative foot care services. No changes to ROS.    Podiatric Exam: Vascular: dorsalis pedis and posterior tibial pulses are palpable bilateral. Capillary return is immediate. Temperature gradient is WNL. Skin turgor WNL  Sensorium: Absent  Semmes Weinstein monofilament test. Normal tactile sensation bilaterally. Nail Exam: Pt has thick disfigured discolored nails with subungual debris noted bilateral entire nail hallux through fifth toenails Ulcer Exam: There is no evidence of ulcer or pre-ulcerative changes or infection. Orthopedic Exam: Muscle tone and strength are WNL. No limitations in general ROM. No crepitus or effusions noted. Foot type and digits show no abnormalities. Bony prominences are unremarkable. Skin: No Porokeratosis. No infection or ulcers  Diagnosis:  Onychomycosis, , Pain in right toe, pain in left toes Neuropathy.   Treatment & Plan Procedures and Treatment: Consent by patient was obtained for treatment procedures. The patient understood the discussion of treatment and procedures well. All questions were answered thoroughly reviewed. Debridement of mycotic and hypertrophic toenails, 1 through 5 bilateral and clearing of subungual debris. No ulceration, no infection noted.  Return Visit-Office Procedure: Patient instructed to return to the office for a follow up visit 3 months for continued evaluation and treatment.    Helane Gunther DPM

## 2017-04-04 ENCOUNTER — Ambulatory Visit: Payer: Medicare Other | Admitting: Podiatry

## 2017-04-07 ENCOUNTER — Ambulatory Visit: Payer: Medicare Other | Admitting: Podiatry

## 2017-04-18 ENCOUNTER — Ambulatory Visit: Payer: Medicare Other | Admitting: Podiatry

## 2017-06-06 ENCOUNTER — Encounter: Payer: Self-pay | Admitting: Podiatry

## 2017-06-06 ENCOUNTER — Ambulatory Visit (INDEPENDENT_AMBULATORY_CARE_PROVIDER_SITE_OTHER): Payer: Medicare Other | Admitting: Podiatry

## 2017-06-06 DIAGNOSIS — M79609 Pain in unspecified limb: Secondary | ICD-10-CM

## 2017-06-06 DIAGNOSIS — G609 Hereditary and idiopathic neuropathy, unspecified: Secondary | ICD-10-CM

## 2017-06-06 DIAGNOSIS — B351 Tinea unguium: Secondary | ICD-10-CM | POA: Diagnosis not present

## 2017-06-06 NOTE — Progress Notes (Signed)
Complaint:  Visit Type: Patient returns to my office for continued preventative foot care services. Complaint: Patient states" my nails have grown long and thick and become painful to walk and wear shoes.   The patient presents for preventative foot care services. No changes to ROS.    Podiatric Exam: Vascular: dorsalis pedis and posterior tibial pulses are palpable bilateral. Capillary return is immediate. Temperature gradient is WNL. Skin turgor WNL  Sensorium: Absent  Semmes Weinstein monofilament test. Normal tactile sensation bilaterally. Nail Exam: Pt has thick disfigured discolored nails with subungual debris noted bilateral entire nail hallux through fifth toenails Ulcer Exam: There is no evidence of ulcer or pre-ulcerative changes or infection. Orthopedic Exam: Muscle tone and strength are WNL. No limitations in general ROM. No crepitus or effusions noted. Foot type and digits show no abnormalities. Bony prominences are unremarkable. Skin: No Porokeratosis. No infection or ulcers  Diagnosis:  Onychomycosis, , Pain in right toe, pain in left toes Neuropathy.   Treatment & Plan Procedures and Treatment: Consent by patient was obtained for treatment procedures. The patient understood the discussion of treatment and procedures well. All questions were answered thoroughly reviewed. Debridement of mycotic and hypertrophic toenails, 1 through 5 bilateral and clearing of subungual debris. No ulceration, no infection noted. ABN signed for 2019. Return Visit-Office Procedure: Patient instructed to return to the office for a follow up visit 3 months for continued evaluation and treatment.    Helane GuntherGregory Keirstyn Aydt DPM

## 2017-07-27 ENCOUNTER — Encounter: Payer: Self-pay | Admitting: Internal Medicine

## 2017-08-05 DIAGNOSIS — M19011 Primary osteoarthritis, right shoulder: Secondary | ICD-10-CM | POA: Insufficient documentation

## 2017-09-05 ENCOUNTER — Ambulatory Visit: Payer: Medicare Other | Admitting: Podiatry

## 2017-12-11 DIAGNOSIS — M51369 Other intervertebral disc degeneration, lumbar region without mention of lumbar back pain or lower extremity pain: Secondary | ICD-10-CM | POA: Insufficient documentation

## 2017-12-11 DIAGNOSIS — I10 Essential (primary) hypertension: Secondary | ICD-10-CM | POA: Diagnosis present

## 2017-12-11 DIAGNOSIS — K4090 Unilateral inguinal hernia, without obstruction or gangrene, not specified as recurrent: Secondary | ICD-10-CM | POA: Insufficient documentation

## 2017-12-19 ENCOUNTER — Other Ambulatory Visit: Payer: Self-pay

## 2017-12-19 ENCOUNTER — Emergency Department
Admission: EM | Admit: 2017-12-19 | Discharge: 2017-12-19 | Disposition: A | Discharge status: Home / Self Care | Payer: Medicare Other | Attending: Emergency Medicine | Admitting: Emergency Medicine

## 2017-12-19 ENCOUNTER — Emergency Department: Payer: Medicare Other

## 2017-12-19 ENCOUNTER — Emergency Department: Payer: Self-pay

## 2017-12-19 DIAGNOSIS — I2510 Atherosclerotic heart disease of native coronary artery without angina pectoris: Secondary | ICD-10-CM | POA: Diagnosis not present

## 2017-12-19 DIAGNOSIS — Z7982 Long term (current) use of aspirin: Secondary | ICD-10-CM | POA: Diagnosis not present

## 2017-12-19 DIAGNOSIS — Z79899 Other long term (current) drug therapy: Secondary | ICD-10-CM | POA: Diagnosis not present

## 2017-12-19 DIAGNOSIS — Z87891 Personal history of nicotine dependence: Secondary | ICD-10-CM | POA: Diagnosis not present

## 2017-12-19 DIAGNOSIS — R52 Pain, unspecified: Secondary | ICD-10-CM | POA: Insufficient documentation

## 2017-12-19 DIAGNOSIS — E039 Hypothyroidism, unspecified: Secondary | ICD-10-CM | POA: Diagnosis not present

## 2017-12-19 DIAGNOSIS — Z96643 Presence of artificial hip joint, bilateral: Secondary | ICD-10-CM | POA: Diagnosis not present

## 2017-12-19 DIAGNOSIS — Z95828 Presence of other vascular implants and grafts: Secondary | ICD-10-CM | POA: Insufficient documentation

## 2017-12-19 MED ORDER — SODIUM CHLORIDE 0.9% FLUSH
10.0000 mL | Freq: Two times a day (BID) | INTRAVENOUS | Status: DC
  Administered 2017-12-19: 10 mL

## 2017-12-19 MED ORDER — SODIUM CHLORIDE 0.9 % IV SOLN
2.0000 g | Freq: Once | INTRAVENOUS | Status: AC
  Administered 2017-12-19: 2 g via INTRAVENOUS
  Filled 2017-12-19: qty 2

## 2017-12-19 MED ORDER — SODIUM CHLORIDE 0.9% FLUSH: 10.0000 mL | INTRAVENOUS | Status: DC | PRN

## 2017-12-19 NOTE — ED Notes (Signed)
Pt transported back to [facility] via ems. Pt verbalized understanding discharge instructions.

## 2017-12-19 NOTE — ED Triage Notes (Signed)
Pt from [facility] via ems for reinsertion of picc line, pt refused to let mobile[facility] reinsert at [facility] later today. PICC line still inserted at this time, no bleeding at that site. NAD, pt ambulatory from stretcher to stretcher

## 2017-12-19 NOTE — ED Provider Notes (Signed)
[facility] Emergency Department Provider Note  Time seen: 11:23 AM  I have reviewed the triage vital signs and the nursing notes.   HISTORY  Chief Complaint No chief complaint on file.    HPI Bryan Howard. is a 84 y.o. male with a past medical history of CAD, gastric reflux, hyperlipidemia, status post diverticulitis currently on IV antibiotics via PICC line presents to the emergency department for dislodged PICC line.  According to the patient he had an x-ray performed as his PICC line had been pulled out a little bit.  He states the x-ray showed that the PICC line needed to be exchanged.  Patient is currently prescribed cefepime 2 g IV daily.  States his last dose was Saturday he missed yesterday.  Patient also takes oral Flagyl per record review.  Patient denies any abdominal pain.  States pain in all of his joints and back but states that is chronic.  Denies any fever.   Past Medical History:  Diagnosis Date  . Abdominal aortic aneurysm (HCC)   . Coronary artery disease   . Diverticulosis of colon   . Esophageal stricture   . GERD (gastroesophageal reflux disease)   . Hemorrhoids, external   . Hiatal hernia   . Hyperlipidemia   . Hypothyroidism     Patient Active Problem List   Diagnosis Date Noted  . Disequilibrium 09/29/2016  . 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 Generalized muscle weakness 08/20/2016  . Primary osteoarthritis of right hip 07/16/2016  . Right lumbar radiculitis 06/22/2016  . Greater trochanteric bursitis of left hip 06/18/2016  . Greater trochanteric bursitis of right hip 06/18/2016  . Choledocholithiasis 03/19/2016  . Obesity, unspecified 12/28/2011  . HYPOTHYROIDISM 10/13/2007  . HYPERLIPIDEMIA 10/13/2007  . CORONARY ARTERY DISEASE 10/13/2007  . HEMORRHOIDS, EXTERNAL 10/13/2007  . ESOPHAGEAL STRICTURE 10/13/2007  . GERD 10/13/2007  . HIATAL HERNIA 10/13/2007  . DIVERTICULOSIS OF COLON 10/13/2007  .  ABDOMINAL AORTIC ANEURYSM, HX OF 10/13/2007  . COLONIC POLYPS, HX OF 10/13/2007    Past Surgical History:  Procedure Laterality Date  . ABDOMINAL AORTIC ANEURYSM REPAIR    . APPENDECTOMY    . EYE SURGERY     bilateral cataract surgery  . INGUINAL HERNIA REPAIR     Right inguinal herniography  . NECK SURGERY     fuse vertabrae   . ROTATOR CUFF REPAIR     left  . TOTAL KNEE ARTHROPLASTY     left   . TOTAL KNEE ARTHROPLASTY     right    Prior to Admission medications   Medication Sig Start Date End Date Taking? Authorizing Provider  Alpha-Lipoic Acid 300 MG CAPS Take by mouth.    [provider]  amLODipine (NORVASC) 5 MG tablet Take by mouth. 09/09/16 09/09/17  [provider]  ascorbic acid (SM VIT C/ROSE HIPS) 1000 MG tablet Take by mouth.    [provider]  ascorbic acid (VITAMIN C) 100 MG tablet Take by mouth.    [provider]  aspirin EC 81 MG tablet Take by mouth.    [provider]  atorvastatin (LIPITOR) 80 MG tablet Take by mouth. 12/14/16 05/19/17  [provider]  Cholecalciferol (VITAMIN D-1000 MAX ST) 1000 units tablet Take by mouth.    [provider]  Coenzyme Q-10 200 MG CAPS Take by mouth.    [provider]  cyanocobalamin ([employer] VITAMIN B12) 1000 MCG tablet Take by mouth. 03/05/09   [provider]  DULoxetine (CYMBALTA) 30 MG capsule Take by mouth. 09/09/16 08/26/17  [provider]  etodolac (LODINE) 200 MG capsule TAKE 1 CAPSULE (200 MG TOTAL) BY MOUTH 2 (TWO) TIMES DAILY. 06/19/15   [provider]  gabapentin (NEURONTIN) 300 MG capsule Take by mouth. 09/09/16 03/04/17  [provider]  levothyroxine (SYNTHROID, LEVOTHROID) 112 MCG tablet Take by mouth. 09/09/16 06/04/17  [provider]  losartan (COZAAR) 25 MG tablet Take by mouth. 08/17/16 08/17/17  [provider]  omeprazole (PRILOSEC) 20 MG capsule Take by mouth. 12/23/16 03/24/17  [provider]  sertraline (ZOLOFT) 50 MG tablet Take 50 mg by mouth daily.  01/14/12   [provider]  Tamsulosin HCl (FLOMAX) 0.4 MG CAPS 0.4 mg daily.  02/20/12   [provider]  vitamin B-12 (CYANOCOBALAMIN) 1000 MCG tablet Take 1,000 mcg by mouth daily.      [provider]    Allergies  Allergen Reactions  . Captopril Cough    Family History  Problem Relation Age of Onset  . Colon cancer Cousin        maternal 1st cousin  . Heart disease Unknown     Social History Social History   Tobacco Use  . Smoking status: Former Games developer  . Smokeless tobacco: Never Used  Substance Use Topics  . Alcohol use: No    Comment: occasionally  . Drug use: No    Review of Systems Constitutional: Negative for fever. Cardiovascular: Negative for chest pain. Respiratory: Negative for shortness of breath. Gastrointestinal: Negative for abdominal pain.  Positive for constipation. Musculoskeletal: Chronic back and joint pain All other ROS negative  ____________________________________________   PHYSICAL EXAM:  VITAL SIGNS: ED Triage Vitals  Enc Vitals Group     BP 12/19/17 1111 113/65     Pulse Rate 12/19/17 1111 72     Resp --      Temp 12/19/17 1111 98.1 F (36.7 C)     Temp Source 12/19/17 1111 Oral     SpO2 12/19/17 1111 (!) 89 %     Weight 12/19/17 1100 205 lb 14.4 oz (93.4 kg)     Height 12/19/17 1100 5\' 6"  (1.676 m)     Head Circumference --      Peak Flow --      Pain Score 12/19/17 1059 7     Pain Loc --      Pain Edu? --      Excl. in GC? --    Constitutional: Alert and oriented. Well appearing and in no distress. Eyes: Normal exam ENT   Head: Normocephalic and atraumatic   Mouth/Throat: Mucous membranes are moist. Cardiovascular: Normal rate, regular rhythm.  Respiratory: Normal respiratory effort without tachypnea nor retractions. Breath sounds are clear Gastrointestinal: Soft and nontender. No distention.   Musculoskeletal:  Nontender with normal range of motion in all extremities.  Right upper extremity PICC line. Neurologic:  Normal speech and language. No gross focal neurologic deficits  Skin:  Skin is warm, dry and intact.  Psychiatric: Mood and affect are normal.   ____________________________________________   INITIAL IMPRESSION / ASSESSMENT AND PLAN / ED COURSE  Pertinent labs & imaging results that were available during my care of the patient were reviewed by me and considered in my medical decision making (see chart for details).  Patient presents to the emergency department for a dislodged PICC line hoping to get a PIC catheter exchange.  Per record review patient was diagnosed with diverticulitis needs  to be on IV antibiotics through 12/26/2017.  As the patient has 1 more week of IV antibiotics we will attempt to replace the PICC line in the emergency department have the patient received his 2 g of cefepime and hopefully discharge back to his nursing facility.  Patient has no fever, afebrile with normal vitals in the emergency department denies any abdominal pain.  IV team was able to exchange the patient's PICC line.   Patient finishing a cefepime infusion and will be discharged back to his nursing facility.  Pulse ox recorded at 89% however when I saw the patient he continues to have a great waveform at 96%.  ____________________________________________   FINAL CLINICAL IMPRESSION(S) / ED DIAGNOSES  PICC line dislodgment   Minna Antis, MD 12/19/17 1520

## 2017-12-19 NOTE — Progress Notes (Signed)
Peripherally Inserted Central Catheter/Midline Placement  The IV Nurse has discussed with the patient and/or persons authorized to consent for the patient, the purpose of this procedure and the potential benefits and risks involved with this procedure.  The benefits include less needle sticks, lab draws from the catheter, and the patient may be discharged home with the catheter. Risks include, but not limited to, infection, bleeding, blood clot (thrombus formation), and puncture of an artery; nerve damage and irregular heartbeat and possibility to perform a PICC exchange if needed/ordered by physician.  Alternatives to this procedure were also discussed.  Bard Power PICC patient education guide, fact sheet on infection prevention and patient information card has been provided to patient /or left at bedside.    PICC/Midline Placement Documentation  PICC Single Lumen 12/19/17 PICC Right Basilic 40 cm (Active)  Indication for Insertion or Continuance of Line Home intravenous therapies (PICC only) 12/19/2017  2:00 PM  Exposed Catheter (cm) 0 cm 12/19/2017  2:00 PM  Site Assessment Clean;Dry;Intact 12/19/2017  2:00 PM  Line Status Flushed;Blood return noted 12/19/2017  2:00 PM  Dressing Type Transparent 12/19/2017  2:00 PM  Dressing Status Clean;Dry;Intact;Antimicrobial disc in place 12/19/2017  2:00 PM  Dressing Intervention New dressing 12/19/2017  2:00 PM  Dressing Change Due 12/26/17 12/19/2017  2:00 PM       Stacie Glaze Horton 12/19/2017, 2:20 PM

## 2017-12-19 NOTE — Accreditation Note (Signed)
PICC line discontinue per MD order. Took out 40 cm, catheter intact.No bleeding noted to insertion site.Upper arm with old bruised area. Dsg inplace. Patient tolerated the procedure. RN aware.

## 2017-12-19 NOTE — ED Notes (Signed)
[facility]  CALLED  FOR  TRANSPORT  TO  [facility] 

## 2017-12-25 ENCOUNTER — Other Ambulatory Visit
Admission: RE | Admit: 2017-12-25 | Discharge: 2017-12-25 | Disposition: A | Discharge status: Home / Self Care | Payer: Medicare Other | Source: Ambulatory Visit | Attending: Family Medicine | Admitting: Family Medicine

## 2017-12-25 DIAGNOSIS — K5792 Diverticulitis of intestine, part unspecified, without perforation or abscess without bleeding: Secondary | ICD-10-CM | POA: Insufficient documentation

## 2017-12-25 LAB — COMPREHENSIVE METABOLIC PANEL
ALBUMIN: 3.2 g/dL — AB (ref 3.5–5.0)
ALK PHOS: 64 U/L (ref 38–126)
ALT: 40 U/L (ref 0–44)
ANION GAP: 4 — AB (ref 5–15)
AST: 25 U/L (ref 15–41)
BILIRUBIN TOTAL: 0.6 mg/dL (ref 0.3–1.2)
BUN: 18 mg/dL (ref 8–23)
CALCIUM: 8.9 mg/dL (ref 8.9–10.3)
CO2: 30 mmol/L (ref 22–32)
Chloride: 103 mmol/L (ref 98–111)
Creatinine, Ser: 0.77 mg/dL (ref 0.61–1.24)
GFR calc Af Amer: >60 mL/min (ref >60)
GFR calc non Af Amer: >60 mL/min (ref >60)
GLUCOSE: 104 mg/dL — AB (ref 70–99)
Potassium: 4.4 mmol/L (ref 3.5–5.1)
SODIUM: 137 mmol/L (ref 135–145)
Total Protein: 5.6 g/dL — ABNORMAL LOW (ref 6.5–8.1)

## 2017-12-25 LAB — CBC WITH DIFFERENTIAL/PLATELET
BAND NEUTROPHILS: 1 %
BASOS ABS: 0.1 10*3/uL (ref 0–0.1)
BASOS PCT: 1 %
Blasts: 0 %
EOS ABS: 0.4 10*3/uL (ref 0–0.7)
Eosinophils Relative: 5 %
HEMATOCRIT: 33.9 % — AB (ref 40.0–52.0)
HEMOGLOBIN: 12.1 g/dL — AB (ref 13.0–18.0)
LYMPHS PCT: 29 %
Lymphs Abs: 2.3 10*3/uL (ref 1.0–3.6)
MCH: 36 pg — ABNORMAL HIGH (ref 26.0–34.0)
MCHC: 35.6 g/dL (ref 32.0–36.0)
MCV: 101 fL — ABNORMAL HIGH (ref 80.0–100.0)
MONO ABS: 0.3 10*3/uL (ref 0.2–1.0)
MYELOCYTES: 0 %
Metamyelocytes Relative: 0 %
Monocytes Relative: 4 %
Neutro Abs: 4.9 10*3/uL (ref 1.4–6.5)
Neutrophils Relative %: 60 %
Other: 0 %
PROMYELOCYTES RELATIVE: 0 %
Platelets: 215 10*3/uL (ref 150–440)
RBC: 3.35 MIL/uL — ABNORMAL LOW (ref 4.40–5.90)
RDW: 14.1 % (ref 11.5–14.5)
WBC: 8 10*3/uL (ref 3.8–10.6)
nRBC: 0 /100 WBC

## 2018-01-25 ENCOUNTER — Ambulatory Visit: Payer: Medicare Other | Attending: Neurology

## 2018-01-25 DIAGNOSIS — Z8673 Personal history of transient ischemic attack (TIA), and cerebral infarction without residual deficits: Secondary | ICD-10-CM | POA: Diagnosis present

## 2018-01-25 DIAGNOSIS — G4733 Obstructive sleep apnea (adult) (pediatric): Secondary | ICD-10-CM | POA: Diagnosis present

## 2018-10-10 IMAGING — DX DG CHEST 1V PORT
1 series · 1 of 1 positions shown · non-contrast
Comparison: Chest radiograph 05/10/2009

CLINICAL DATA: Evaluate PICC line.

EXAM:
PORTABLE CHEST 1 VIEW

[chest ap]
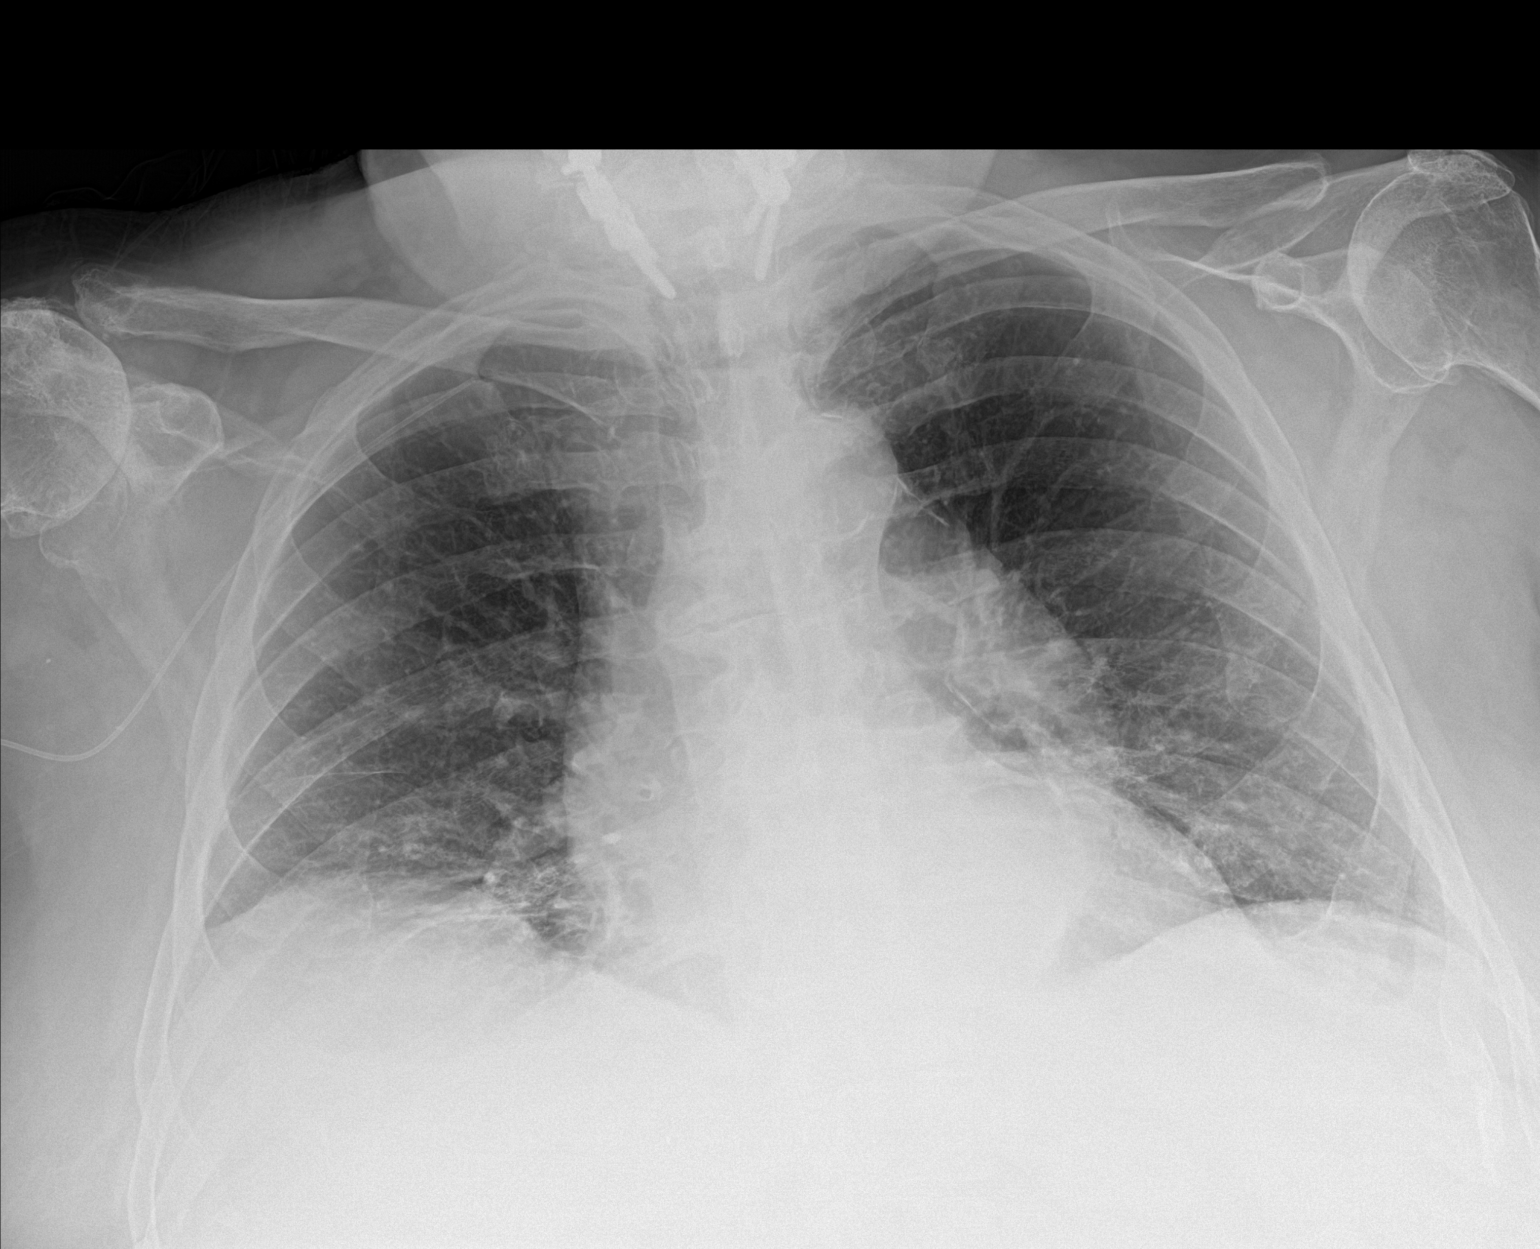

[1 of 1 positions shown; findings below may reference images not displayed]

FINDINGS: Stable cardiac contours. Tortuosity of the thoracic aorta. Aortic
calcification. Elevation right hemidiaphragm. Right basilar
atelectasis. No pleural effusion or pneumothorax. Right upper
extremity PICC line tip projects at the mid subclavian vein.
IMPRESSION: Right upper extremity PICC line tip projects at the mid subclavian
vein, consider advancement.

## 2019-09-19 DIAGNOSIS — Z8661 Personal history of infections of the central nervous system: Secondary | ICD-10-CM | POA: Insufficient documentation

## 2019-09-19 DIAGNOSIS — Z8739 Personal history of other diseases of the musculoskeletal system and connective tissue: Secondary | ICD-10-CM | POA: Insufficient documentation

## 2019-10-01 DIAGNOSIS — R5383 Other fatigue: Secondary | ICD-10-CM | POA: Insufficient documentation

## 2019-10-01 DIAGNOSIS — N453 Epididymo-orchitis: Secondary | ICD-10-CM | POA: Insufficient documentation

## 2022-01-07 ENCOUNTER — Other Ambulatory Visit: Payer: Self-pay | Admitting: Pediatrics

## 2022-01-07 DIAGNOSIS — N3001 Acute cystitis with hematuria: Secondary | ICD-10-CM

## 2022-01-07 DIAGNOSIS — N5089 Other specified disorders of the male genital organs: Secondary | ICD-10-CM

## 2022-01-07 DIAGNOSIS — R310 Gross hematuria: Secondary | ICD-10-CM

## 2022-01-14 ENCOUNTER — Ambulatory Visit
Admission: RE | Admit: 2022-01-14 | Discharge: 2022-01-14 | Disposition: A | Discharge status: Home / Self Care | Payer: Medicare Other | Source: Ambulatory Visit | Attending: Pediatrics | Admitting: Pediatrics

## 2022-01-14 DIAGNOSIS — N5089 Other specified disorders of the male genital organs: Secondary | ICD-10-CM | POA: Diagnosis present

## 2022-01-14 DIAGNOSIS — N3001 Acute cystitis with hematuria: Secondary | ICD-10-CM | POA: Insufficient documentation

## 2022-01-14 DIAGNOSIS — R310 Gross hematuria: Secondary | ICD-10-CM | POA: Insufficient documentation

## 2022-09-29 DIAGNOSIS — Z86711 Personal history of pulmonary embolism: Secondary | ICD-10-CM | POA: Diagnosis present

## 2022-09-29 DIAGNOSIS — I2699 Other pulmonary embolism without acute cor pulmonale: Secondary | ICD-10-CM | POA: Insufficient documentation

## 2022-11-10 DIAGNOSIS — I509 Heart failure, unspecified: Secondary | ICD-10-CM | POA: Insufficient documentation

## 2022-11-10 DIAGNOSIS — J449 Chronic obstructive pulmonary disease, unspecified: Secondary | ICD-10-CM | POA: Diagnosis present

## 2022-11-10 DIAGNOSIS — F321 Major depressive disorder, single episode, moderate: Secondary | ICD-10-CM | POA: Insufficient documentation

## 2022-11-19 DIAGNOSIS — W19XXXA Unspecified fall, initial encounter: Secondary | ICD-10-CM | POA: Insufficient documentation

## 2022-11-26 DIAGNOSIS — Z85828 Personal history of other malignant neoplasm of skin: Secondary | ICD-10-CM | POA: Insufficient documentation

## 2022-12-30 ENCOUNTER — Emergency Department: Payer: Medicare Other

## 2022-12-30 ENCOUNTER — Emergency Department
Admission: EM | Admit: 2022-12-30 | Discharge: 2022-12-30 | Disposition: A | Discharge status: Home / Self Care | Payer: Medicare Other | Attending: Emergency Medicine | Admitting: Emergency Medicine

## 2022-12-30 ENCOUNTER — Other Ambulatory Visit: Payer: Self-pay

## 2022-12-30 DIAGNOSIS — Z7901 Long term (current) use of anticoagulants: Secondary | ICD-10-CM | POA: Diagnosis not present

## 2022-12-30 DIAGNOSIS — Z86711 Personal history of pulmonary embolism: Secondary | ICD-10-CM | POA: Insufficient documentation

## 2022-12-30 DIAGNOSIS — S0990XA Unspecified injury of head, initial encounter: Secondary | ICD-10-CM | POA: Diagnosis present

## 2022-12-30 DIAGNOSIS — M25552 Pain in left hip: Secondary | ICD-10-CM | POA: Diagnosis not present

## 2022-12-30 DIAGNOSIS — M25551 Pain in right hip: Secondary | ICD-10-CM | POA: Diagnosis not present

## 2022-12-30 DIAGNOSIS — Z8546 Personal history of malignant neoplasm of prostate: Secondary | ICD-10-CM | POA: Insufficient documentation

## 2022-12-30 DIAGNOSIS — I2510 Atherosclerotic heart disease of native coronary artery without angina pectoris: Secondary | ICD-10-CM | POA: Insufficient documentation

## 2022-12-30 DIAGNOSIS — W19XXXA Unspecified fall, initial encounter: Secondary | ICD-10-CM

## 2022-12-30 DIAGNOSIS — M25559 Pain in unspecified hip: Secondary | ICD-10-CM

## 2022-12-30 NOTE — Discharge Instructions (Signed)
You were seen in the ER today for evaluation after fall.  Fortunately your testing did not show any serious injuries.  Please be careful when you are getting around in your home.  Return to the ER for new or worsening symptoms.

## 2022-12-30 NOTE — ED Notes (Signed)
Caregiver at bedside, able to take pt home. D/c instructions reviewed

## 2022-12-30 NOTE — ED Provider Notes (Signed)
[facility] Provider Note    Event Date/Time   First MD Initiated Contact with Patient 12/30/22 [phone]     (approximate)   History   Fall   HPI  Bryan Howard. is a 89 year old male with history of prostate cancer, peripheral neuropathy, CAD, OSA, PE on Eliquis presenting to the emergency department for evaluation after a fall.  Patient thinks he fell asleep in his electric scooter last night.  He fell forward into his mattress with his head and woke up.  He then lowered himself to the ground as he was having worsening neuropathy symptoms in his legs.  He was having some worsening pain leading him to present to the ER.  Denies chest pain, abdominal pain, back pain.  No new numbness, tingling, focal weakness.  Otherwise reports he has been feeling at his baseline without complaints.    Physical Exam   Triage Vital Signs: ED Triage Vitals  Encounter Vitals Group     BP 12/30/22 0857 (!) 103/49     Systolic BP Percentile --      Diastolic BP Percentile --      Pulse Rate 12/30/22 0857 60     Resp 12/30/22 0857 20     Temp 12/30/22 0855 (!) 97.4 F (36.3 C)     Temp src --      SpO2 12/30/22 0857 100 %     Weight 12/30/22 0855 186 lb 8.2 oz (84.6 kg)     Height --      Head Circumference --      Peak Flow --      Pain Score 12/30/22 0855 9     Pain Loc --      Pain Education --      Exclude from Growth Chart --     Most recent vital signs: Vitals:   12/30/22 0855 12/30/22 0857  BP:  (!) 103/49  Pulse:  60  Resp:  20  Temp: (!) 97.4 F (36.3 C)   SpO2:  100%    Nursing notes and vital signs reviewed.  General: Adult male, laying in bed, awake and interactive Head: Atraumatic Chest: Symmetric chest rise, no tenderness to palpation.  Cardiac: Regular rhythm and rate.  Respiratory: Lungs clear to auscultation Abdomen: Soft, nondistended. No tenderness to palpation.  Pelvis: Stable in AP and lateral compression.  Does report pain with  palpation over the bilateral hips. MSK: No deformity to bilateral upper and lower extremity. Full range of motion to bilateral upper lower extremity with no pain. Neuro: Alert, oriented. GCS 15. Normal sensation to light touch in bilateral upper and lower extremity. Skin: No evidence of burns or lacerations. ED Results / Procedures / Treatments   Labs (all labs ordered are listed, but only abnormal results are displayed) Labs Reviewed - No data to display   EKG EKG independently reviewed interpreted by myself (ER attending) demonstrates:  EKG demonstrates sinus rhythm at a rate of 54, PR 264, QRS 96, QTc 469, no acute ST changes  RADIOLOGY Imaging independently reviewed and interpreted by myself demonstrates:  CT head without acute bleed CT C-spine without acute fracture Hip XR without fracture  PROCEDURES:  Critical Care performed: No  Procedures   MEDICATIONS ORDERED IN ED: Medications - No data to display   IMPRESSION / MDM / ASSESSMENT AND PLAN / ED COURSE  I reviewed the triage vital signs and the nursing notes.  Differential diagnosis includes, but is not limited to, intracranial  bleed, skull fracture, spine fracture, no evidence of thoracoabdominal trauma, hip fracture, dislocation, exacerbation of neuropathy  Patient's presentation is most consistent with acute presentation with potential threat to life or bodily function.  89 year old male presenting after a fall.  No obvious trauma on exam.  Does report head injury and is on an anticoagulant so will obtain CT head and C-spine.  Will also obtain hip x-Talitha Dicarlo.  Imaging reassuring.  Patient reevaluated.  Reports some generalized soreness, but declining further pain control.  He is comfortable discharge home.  Do think this is reasonable.  Strict return precautions provided.  Patient discharged in stable condition.      FINAL CLINICAL IMPRESSION(S) / ED DIAGNOSES   Final diagnoses:  Injury of head, initial  encounter  Fall in home, initial encounter  Hip pain, unspecified laterality     Rx / DC Orders   ED Discharge Orders     None        Note:  This document was prepared using Dragon voice recognition software and may include unintentional dictation errors.   Trinna Post, MD 12/30/22 1104

## 2022-12-30 NOTE — ED Triage Notes (Addendum)
Pt to ED [facility] from [facility] independent  for fall after transitioning from bed to wheelchair. Thinks knee gave out. Possibly laying in floor for a couple hours. Denies LOC.

## 2022-12-30 NOTE — ED Notes (Signed)
Attempted to contact son at number listed in chart regarding pt discharge, no answer.

## 2023-01-03 DIAGNOSIS — I7143 Infrarenal abdominal aortic aneurysm, without rupture: Secondary | ICD-10-CM | POA: Insufficient documentation

## 2023-03-17 ENCOUNTER — Emergency Department
Admission: EM | Admit: 2023-03-17 | Discharge: 2023-03-17 | Disposition: A | Discharge status: Home / Self Care | Payer: Medicare Other | Attending: Emergency Medicine | Admitting: Emergency Medicine

## 2023-03-17 ENCOUNTER — Other Ambulatory Visit: Payer: Self-pay

## 2023-03-17 ENCOUNTER — Emergency Department: Payer: Medicare Other

## 2023-03-17 DIAGNOSIS — S0990XA Unspecified injury of head, initial encounter: Secondary | ICD-10-CM | POA: Diagnosis present

## 2023-03-17 DIAGNOSIS — Y92002 Bathroom of unspecified non-institutional (private) residence single-family (private) house as the place of occurrence of the external cause: Secondary | ICD-10-CM | POA: Diagnosis not present

## 2023-03-17 DIAGNOSIS — W16212A Fall in (into) filled bathtub causing other injury, initial encounter: Secondary | ICD-10-CM | POA: Insufficient documentation

## 2023-03-17 DIAGNOSIS — N3000 Acute cystitis without hematuria: Secondary | ICD-10-CM | POA: Insufficient documentation

## 2023-03-17 DIAGNOSIS — E039 Hypothyroidism, unspecified: Secondary | ICD-10-CM | POA: Diagnosis not present

## 2023-03-17 DIAGNOSIS — W19XXXA Unspecified fall, initial encounter: Secondary | ICD-10-CM

## 2023-03-17 LAB — URINALYSIS, W/ REFLEX TO CULTURE (INFECTION SUSPECTED)
Bilirubin Urine: NEGATIVE
Glucose, UA: NEGATIVE mg/dL
Hgb urine dipstick: NEGATIVE
Ketones, ur: NEGATIVE mg/dL
Nitrite: NEGATIVE
Protein, ur: NEGATIVE mg/dL
Specific Gravity, Urine: 1.012 (ref 1.005–1.030)
WBC, UA: >50 WBC/hpf (ref 0–5)
pH: 6 (ref 5.0–8.0)

## 2023-03-17 LAB — CBC WITH DIFFERENTIAL/PLATELET
Abs Immature Granulocytes: 0.05 10*3/uL (ref 0.00–0.07)
Basophils Absolute: 0 10*3/uL (ref 0.0–0.1)
Basophils Relative: 0 %
Eosinophils Absolute: 0 10*3/uL (ref 0.0–0.5)
Eosinophils Relative: 0 %
HCT: 34.6 % — ABNORMAL LOW (ref 39.0–52.0)
Hemoglobin: 11 g/dL — ABNORMAL LOW (ref 13.0–17.0)
Immature Granulocytes: 1 %
Lymphocytes Relative: 14 %
Lymphs Abs: 1 10*3/uL (ref 0.7–4.0)
MCH: 32.4 pg (ref 26.0–34.0)
MCHC: 31.8 g/dL (ref 30.0–36.0)
MCV: 102.1 fL — ABNORMAL HIGH (ref 80.0–100.0)
Monocytes Absolute: 0.5 10*3/uL (ref 0.1–1.0)
Monocytes Relative: 7 %
Neutro Abs: 5.6 10*3/uL (ref 1.7–7.7)
Neutrophils Relative %: 78 %
Platelets: 147 10*3/uL — ABNORMAL LOW (ref 150–400)
RBC: 3.39 MIL/uL — ABNORMAL LOW (ref 4.22–5.81)
RDW: 13.8 % (ref 11.5–15.5)
WBC: 7.2 10*3/uL (ref 4.0–10.5)
nRBC: 0 % (ref 0.0–0.2)

## 2023-03-17 LAB — BASIC METABOLIC PANEL
Anion gap: 8 (ref 5–15)
BUN: 21 mg/dL (ref 8–23)
CO2: 27 mmol/L (ref 22–32)
Calcium: 9.2 mg/dL (ref 8.9–10.3)
Chloride: 105 mmol/L (ref 98–111)
Creatinine, Ser: 0.8 mg/dL (ref 0.61–1.24)
GFR, Estimated: >60 mL/min (ref >60)
Glucose, Bld: 99 mg/dL (ref 70–99)
Potassium: 3.9 mmol/L (ref 3.5–5.1)
Sodium: 140 mmol/L (ref 135–145)

## 2023-03-17 LAB — CK: Total CK: 78 U/L (ref 49–397)

## 2023-03-17 MED ORDER — CEFDINIR 300 MG PO CAPS
300.0000 mg | ORAL_CAPSULE | Freq: Two times a day (BID) | ORAL | 0 refills | Status: AC
Start: 2023-03-17 — End: 2023-03-24

## 2023-03-17 NOTE — ED Provider Notes (Signed)
[facility] Provider Note    None    (approximate)   History   Fall   HPI  Bryan Howard. is a 89 y.o. male who presents today for evaluation after a fall.  Patient reports that he got up to use the bathroom and while he was putting on his depends he lost his balance and fell between the toilet and the bathtub.  Patient reports that he was able to maneuver to the floor of his room where he fell asleep until somebody came at 8 AM and found him there.  Patient did not think that he was on the ground for more than an hour or so but he cannot be sure.  He does not think that he struck his head.  He has not had any headaches, neck pain, nausea, vomiting, visual changes, hip pain, or extremity pain.  He has been able to bear weight since.  He reports that he has been urinating a lot all night and he is wondering if he has a urinary tract infection.  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   Generalized muscle weakness 08/20/2016   Primary osteoarthritis of right hip 07/16/2016   Right lumbar radiculitis 06/22/2016   Greater trochanteric bursitis of left hip 06/18/2016   Greater trochanteric bursitis of right hip 06/18/2016   Choledocholithiasis 03/19/2016   Obesity, unspecified 12/28/2011   Hypothyroidism 10/13/2007   HYPERLIPIDEMIA 10/13/2007   Coronary atherosclerosis 10/13/2007   External hemorrhoids 10/13/2007   ESOPHAGEAL STRICTURE 10/13/2007   GERD 10/13/2007   Diaphragmatic hernia 10/13/2007   Diverticulosis of colon 10/13/2007   History of cardiovascular disorder 10/13/2007   History of colonic polyps 10/13/2007          Physical Exam   Triage Vital Signs: ED Triage Vitals  Encounter Vitals Group     BP 03/17/23 0909 120/73     Systolic BP Percentile --      Diastolic BP Percentile --      Pulse Rate 03/17/23 0909 74     Resp 03/17/23 0909 19      Temp 03/17/23 0909 98 F (36.7 C)     Temp src --      SpO2 03/17/23 0909 100 %     Weight --      Height --      Head Circumference --      Peak Flow --      Pain Score 03/17/23 0907 0     Pain Loc --      Pain Education --      Exclude from Growth Chart --     Most recent vital signs: Vitals:   03/17/23 0909 03/17/23 1230  BP: 120/73 118/68  Pulse: 74 72  Resp: 19 17  Temp: 98 F (36.7 C) 98 F (36.7 C)  SpO2: 100% 100%    Physical Exam Vitals and nursing note reviewed.  Constitutional:      General: Awake and alert. No acute distress.    Appearance: Normal appearance. The patient is normal weight.  HENT:     Head: Normocephalic and atraumatic.     Mouth: Mucous membranes are moist.  Eyes:     General: PERRL. Normal EOMs        Right eye: No discharge.        Left eye: No discharge.     Conjunctiva/sclera: Conjunctivae normal.  Cardiovascular:  Rate and Rhythm: Normal rate and regular rhythm.     Pulses: Normal pulses.  Pulmonary:     Effort: Pulmonary effort is normal. No respiratory distress.     Breath sounds: Normal breath sounds.  Abdominal:     Abdomen is soft. There is no abdominal tenderness. No rebound or guarding. No distention. Musculoskeletal:        General: No swelling. Normal range of motion.     Cervical back: Normal range of motion and neck supple. No midline cervical spine tenderness.  Full range of motion of neck.  Negative Spurling test.  Negative Lhermitte sign.  Normal strength and sensation in bilateral upper extremities. Normal grip strength bilaterally.  Normal intrinsic muscle function of the hand bilaterally.  Normal radial pulses bilaterally. Skin:    General: Skin is warm and dry.     Capillary Refill: Capillary refill takes less than 2 seconds.     Findings: No rash.  Neurological:     Mental Status: The patient is awake and alert.  Neurological: GCS 15 alert and oriented x3 Normal speech, no expressive or receptive  aphasia or dysarthria Cranial nerves II through XII intact Normal visual fields 5 out of 5 strength in all 4 extremities with intact sensation throughout No extremity drift Normal finger-to-nose testing, no limb or truncal ataxia      ED Results / Procedures / Treatments   Labs (all labs ordered are listed, but only abnormal results are displayed) Labs Reviewed  CBC WITH DIFFERENTIAL/PLATELET - Abnormal; Notable for the following components:      Result Value   RBC 3.39 (*)    Hemoglobin 11.0 (*)    HCT 34.6 (*)    MCV 102.1 (*)    Platelets 147 (*)    All other components within normal limits  URINALYSIS, W/ REFLEX TO CULTURE (INFECTION SUSPECTED) - Abnormal; Notable for the following components:   Color, Urine YELLOW (*)    APPearance HAZY (*)    Leukocytes,Ua LARGE (*)    Bacteria, UA RARE (*)    All other components within normal limits  URINE CULTURE  BASIC METABOLIC PANEL  CK     EKG     RADIOLOGY I independently reviewed and interpreted imaging and agree with radiologists findings.     PROCEDURES:  Critical Care performed:   Procedures   MEDICATIONS ORDERED IN ED: Medications - No data to display   IMPRESSION / MDM / ASSESSMENT AND PLAN / ED COURSE  I reviewed the triage vital signs and the nursing notes.  Differential diagnosis includes, but is not limited to, urinary tract infection, intracranial hemorrhage, rhabdomyolysis, dehydration, electrolyte disarray.  Patient is awake and alert, hemodynamically stable and afebrile.  He is neurologically and neurovascularly intact.  Patient has full range of motion of all 4 extremities.  He has no cervical spine tenderness, and normal strength and sensation in bilateral upper extremities, not consistent with central cord syndrome.  CT head and neck obtained due to unwitnessed fall and per [place] criteria were negative for any acute findings.  Labs were also obtained which are reassuring.  Urinalysis is  suggestive of urinary tract infection.  There are no previous urine cultures in his chart.  He was started on antibiotics.  We discussed all findings and recommendations.  Patient is in agreement with this plan.  His caretaker arrived and all findings were discussed with her as well.  Patient and caretaker understand and agree with plan.  Patient was discharged in  stable condition.   Patient's presentation is most consistent with acute complicated illness / injury requiring diagnostic workup.     FINAL CLINICAL IMPRESSION(S) / ED DIAGNOSES   Final diagnoses:  Fall, initial encounter  Acute cystitis without hematuria     Rx / DC Orders   ED Discharge Orders          Ordered    cefdinir (OMNICEF) 300 MG capsule  2 times daily        03/17/23 1248             Note:  This document was prepared using Dragon voice recognition software and may include unintentional dictation errors.   Keturah Shavers 03/17/23 1414    Concha Se, MD 03/18/23 [phone]

## 2023-03-17 NOTE — ED Triage Notes (Signed)
Pt comes via EMS from [facility]. Pt comes with c/o fall. Pt lost balance and fell. Pt was on floor for awhile. Pt is on thinners but didn't hit head. Pt was dx with UTI. Pt denies any pain.

## 2023-03-17 NOTE — Discharge Instructions (Signed)
Your CT scans are normal.  Your labs are normal.  Your urinalysis shows a urinary tract infection.  Please take the antibiotics as prescribed.  Please return for any new, worsening, or change in symptoms or other concerns.  It was a pleasure caring for you today.

## 2023-03-19 LAB — URINE CULTURE

## 2023-08-01 DIAGNOSIS — K5900 Constipation, unspecified: Secondary | ICD-10-CM | POA: Insufficient documentation

## 2023-09-25 DIAGNOSIS — N3000 Acute cystitis without hematuria: Secondary | ICD-10-CM | POA: Insufficient documentation

## 2023-09-29 DIAGNOSIS — G3184 Mild cognitive impairment, so stated: Secondary | ICD-10-CM | POA: Insufficient documentation

## 2023-09-29 DIAGNOSIS — I350 Nonrheumatic aortic (valve) stenosis: Secondary | ICD-10-CM

## 2023-10-23 ENCOUNTER — Other Ambulatory Visit: Payer: Self-pay

## 2023-10-23 ENCOUNTER — Emergency Department
Admission: EM | Admit: 2023-10-23 | Discharge: 2023-10-23 | Disposition: A | Discharge status: Home / Self Care | Attending: Emergency Medicine | Admitting: Emergency Medicine

## 2023-10-23 ENCOUNTER — Emergency Department

## 2023-10-23 DIAGNOSIS — I10 Essential (primary) hypertension: Secondary | ICD-10-CM | POA: Diagnosis not present

## 2023-10-23 DIAGNOSIS — E039 Hypothyroidism, unspecified: Secondary | ICD-10-CM | POA: Insufficient documentation

## 2023-10-23 DIAGNOSIS — D72829 Elevated white blood cell count, unspecified: Secondary | ICD-10-CM | POA: Insufficient documentation

## 2023-10-23 DIAGNOSIS — Z7901 Long term (current) use of anticoagulants: Secondary | ICD-10-CM | POA: Diagnosis not present

## 2023-10-23 DIAGNOSIS — R531 Weakness: Secondary | ICD-10-CM

## 2023-10-23 DIAGNOSIS — N3001 Acute cystitis with hematuria: Secondary | ICD-10-CM | POA: Diagnosis not present

## 2023-10-23 LAB — COMPREHENSIVE METABOLIC PANEL WITH GFR
ALT: 20 U/L (ref 0–44)
AST: 21 U/L (ref 15–41)
Albumin: 3.6 g/dL (ref 3.5–5.0)
Alkaline Phosphatase: 75 U/L (ref 38–126)
Anion gap: 8 (ref 5–15)
BUN: 24 mg/dL — ABNORMAL HIGH (ref 8–23)
CO2: 29 mmol/L (ref 22–32)
Calcium: 9.8 mg/dL (ref 8.9–10.3)
Chloride: 105 mmol/L (ref 98–111)
Creatinine, Ser: 0.97 mg/dL (ref 0.61–1.24)
GFR, Estimated: >60 mL/min (ref >60)
Glucose, Bld: 124 mg/dL — ABNORMAL HIGH (ref 70–99)
Potassium: 4.1 mmol/L (ref 3.5–5.1)
Sodium: 142 mmol/L (ref 135–145)
Total Bilirubin: 1.1 mg/dL (ref 0.0–1.2)
Total Protein: 7.2 g/dL (ref 6.5–8.1)

## 2023-10-23 LAB — URINALYSIS, ROUTINE W REFLEX MICROSCOPIC
Bilirubin Urine: NEGATIVE
Glucose, UA: NEGATIVE mg/dL
Ketones, ur: 5 mg/dL — AB
Nitrite: POSITIVE — AB
Protein, ur: 100 mg/dL — AB
Specific Gravity, Urine: 1.013 (ref 1.005–1.030)
WBC, UA: >50 WBC/hpf (ref 0–5)
pH: 5 (ref 5.0–8.0)

## 2023-10-23 LAB — CBC
HCT: 33.7 % — ABNORMAL LOW (ref 39.0–52.0)
Hemoglobin: 11 g/dL — ABNORMAL LOW (ref 13.0–17.0)
MCH: 33.7 pg (ref 26.0–34.0)
MCHC: 32.6 g/dL (ref 30.0–36.0)
MCV: 103.4 fL — ABNORMAL HIGH (ref 80.0–100.0)
Platelets: 157 K/uL (ref 150–400)
RBC: 3.26 MIL/uL — ABNORMAL LOW (ref 4.22–5.81)
RDW: 13.9 % (ref 11.5–15.5)
WBC: 11.2 K/uL — ABNORMAL HIGH (ref 4.0–10.5)
nRBC: 0 % (ref 0.0–0.2)

## 2023-10-23 MED ORDER — CEFTRIAXONE SODIUM 1 G IJ SOLR
1.0000 g | Freq: Once | INTRAMUSCULAR | Status: AC
  Administered 2023-10-23: 1 g via INTRAMUSCULAR
  Filled 2023-10-23: qty 10

## 2023-10-23 MED ORDER — AMOXICILLIN-POT CLAVULANATE 875-125 MG PO TABS: 1.0000 | ORAL_TABLET | Freq: Two times a day (BID) | ORAL | 0 refills | Status: AC

## 2023-10-23 MED ORDER — LIDOCAINE HCL (PF) 1 % IJ SOLN
INTRAMUSCULAR | Status: AC
  Administered 2023-10-23: 5 mL
  Filled 2023-10-23: qty 5

## 2023-10-23 NOTE — ED Triage Notes (Signed)
 To ED [facility] from home for generalized weakness Had 2 falls today Hurting everywhere  UTI since 2 weeks  EMS VS 123/74, HR 90, 97%, temp 99  Pt says he feels fine, his family made him come, and he did not fall today. He slid out of bed. Hx severe arthritis and chronic pain.  Pt saying he wants to go to [facility]. Asking for water. Pt is disoriented to place and time. Unable to update PMH, pt confused. Chart lists blood products refusal.

## 2023-10-23 NOTE — ED Notes (Signed)
 Performed I&O cath per verbal order from Dr. Malvina and with pt's permission. Cleaned pt's perineum prior to cath and after a BM. New brief applied and clean pad placed under him. Redness noted to groin area and rectum. Pt stated that he was aware and was not treating the areas. Pt advised to treat.

## 2023-10-23 NOTE — ED Provider Notes (Signed)
 [facility] Provider Note    Event Date/Time   First MD Initiated Contact with Patient 10/23/23 1647     (approximate)   History   Weakness   HPI Bryan Howard. is a 89 y.o. male with history of hypothyroidism, GERD, HLD, HTN, AAA s/p repair, recurrent UTIs, on Eliquis presenting today for weakness.  Patient states EMS brought him in after he slipped out of his bed twice today.  Denies head injury.  States he landed on his butt but denies any new pain there.  Denies any injury elsewhere to his head, neck, chest, or any extremities.  Has severe arthritis at baseline which is unchanged.  He states he otherwise feels fine and has chronic weakness.  Denies any cough, congestion, chest pain, abdominal pain, nausea, vomiting.  Has history of frequent UTIs, but is currently denying any dysuria symptoms.  Collateral Information Mamie Diiorio, Son) - called 911 because he did not here from him earlier. States patient has chronic pain symptoms from arthritis. No altered mental status from his baseline that he is aware. He would be fine with patient going home if everything is negative.     Physical Exam   Triage Vital Signs: ED Triage Vitals  Encounter Vitals Group     BP 10/23/23 1620 112/89     Girls Systolic BP Percentile --      Girls Diastolic BP Percentile --      Boys Systolic BP Percentile --      Boys Diastolic BP Percentile --      Pulse Rate 10/23/23 1620 (!) 54     Resp 10/23/23 1620 20     Temp 10/23/23 1620 97.8 F (36.6 C)     Temp Source 10/23/23 1620 Oral     SpO2 10/23/23 1625 99 %     Weight 10/23/23 1617 178 lb (80.7 kg)     Height 10/23/23 1617 5' 4 (1.626 m)     Head Circumference --      Peak Flow --      Pain Score 10/23/23 1620 9     Pain Loc --      Pain Education --      Exclude from Growth Chart --     Most recent vital signs: Vitals:   10/23/23 1625 10/23/23 1939  BP:  111/63  Pulse:  86  Resp:  15  Temp:    SpO2:  99% 100%   Physical Exam: I have reviewed the vital signs and nursing notes. General: Awake, alert, no acute distress.  Nontoxic appearing. Head:  Atraumatic, normocephalic.   ENT:  EOM intact, PERRL. Oral mucosa is pink and moist with no lesions. Neck: Neck is supple with full range of motion, No C-spine tenderness to palpation. Cardiovascular:  RRR, No murmurs. Peripheral pulses palpable and equal bilaterally. Respiratory:  Symmetrical chest wall expansion.  No rhonchi, rales, or wheezes.  Good air movement throughout.  No use of accessory muscles.   Musculoskeletal:  No cyanosis or edema. Moving extremities with full ROM.  Nontender to palpation throughout bilateral upper and lower extremities. Abdomen:  Soft, nontender, nondistended. Neuro:  GCS 15, moving all four extremities, interacting appropriately. Speech clear. Psych:  Calm, appropriate.   Skin:  Warm, dry, no rash.    ED Results / Procedures / Treatments   Labs (all labs ordered are listed, but only abnormal results are displayed) Labs Reviewed  COMPREHENSIVE METABOLIC PANEL WITH GFR - Abnormal; Notable for the  following components:      Result Value   Glucose, Bld 124 (*)    BUN 24 (*)    All other components within normal limits  CBC - Abnormal; Notable for the following components:   WBC 11.2 (*)    RBC 3.26 (*)    Hemoglobin 11.0 (*)    HCT 33.7 (*)    MCV 103.4 (*)    All other components within normal limits  URINALYSIS, ROUTINE W REFLEX MICROSCOPIC - Abnormal; Notable for the following components:   Color, Urine YELLOW (*)    APPearance TURBID (*)    Hgb urine dipstick SMALL (*)    Ketones, ur 5 (*)    Protein, ur 100 (*)    Nitrite POSITIVE (*)    Leukocytes,Ua MODERATE (*)    Bacteria, UA MANY (*)    All other components within normal limits  URINE CULTURE  CBG MONITORING, ED     EKG My EKG interpretation: Rate of 101, sinus tachycardia, normal axis, normal intervals.  No acute ST elevations or  depressions   RADIOLOGY Independently interpreted CT imaging of head and neck with no acute pathology   PROCEDURES:  Critical Care performed: No  Procedures   MEDICATIONS ORDERED IN ED: Medications  cefTRIAXone  (ROCEPHIN ) injection 1 g (has no administration in time range)     IMPRESSION / MDM / ASSESSMENT AND PLAN / ED COURSE  I reviewed the triage vital signs and the nursing notes.                              Differential diagnosis includes, but is not limited to, acute cystitis, global deconditioning, dehydration, ICH, cervical spine injury  Patient's presentation is most consistent with acute complicated illness / injury requiring diagnostic workup.  Patient is an 89 year old male presenting today for fall out of bed.  He reports he slid out of bed but denied any head injury.  However, given that he is on a blood thinner, will get further imaging given this happened twice today.  He denies any new weakness which he states has a chronic history of the same.  He has multiple caregivers at home which help him as well.  He denies any other trauma or injury that is new today and physical exam does not reveal any new tenderness or other deformities.  CT imaging of his head and neck are negative.  CBC and CMP largely consistent with his baseline with no significant leukocytosis.  Patient does have a history of frequent UTIs which sometimes worsens his weakness and states intermittent dysuria over the past several days.  UA does confirm a UTI at this time.  Thankfully, he does not show signs of sepsis and otherwise is at his baseline.  I discussed this with patient and his son over the phone.  They both prefer to go home with treatment of his UTI given otherwise reassuring workup.  Will give one-time dose of ceftriaxone  IM here and send out on Augmentin  as this was what he was most recently treated on based on urine culture susceptibility.  Will send out urine culture as well.  Patient  discharged with strict return precautions.  The patient is on the cardiac monitor to evaluate for evidence of arrhythmia and/or significant heart rate changes. Clinical Course as of 10/23/23 2023  [place] Oct 23, 2023  1842 CT's negative for traumatic injuries [DW]  2008 Urinalysis, Routine w reflex microscopic -Urine,  Clean Catch(!) Positive for UTI [DW]  2012 Patient most recently was treated with 7-day Augmentin  course for UTI which was effective at that time [DW]  2018 Updated son regarding imaging and positive UTI.  [DW]    Clinical Course User Index [DW] Malvina [name] DASEN, MD     FINAL CLINICAL IMPRESSION(S) / ED DIAGNOSES   Final diagnoses:  Acute cystitis with hematuria  Weakness     Rx / DC Orders   ED Discharge Orders          Ordered    amoxicillin -clavulanate (AUGMENTIN ) 875-125 MG tablet  2 times daily        10/23/23 2020             Note:  This document was prepared using Dragon voice recognition software and may include unintentional dictation errors.   Malvina [name] DASEN, MD 10/23/23 2025

## 2023-10-23 NOTE — Discharge Instructions (Addendum)
 CT imaging of your head and neck showed no acute injuries from any of your falls.  Your laboratory workup for the most part is reassuring but we did see signs of infection in your urine.  I have sent antibiotics to the pharmacy for you to take for the next 7 days.  We have also sent off a urine culture and if there is need to change the antibiotics someone will give you a call.  Please follow-up with your primary care provider.  Return for any worsening symptoms such as confusion or severe weakness with inability to walk.

## 2023-10-26 LAB — URINE CULTURE: Culture: >=100000 — AB

## 2023-10-27 NOTE — Progress Notes (Signed)
 ED Antimicrobial Stewardship Positive Culture Follow Up   Bryan Howard. is an 89 y.o. male who presented to [facility] on 10/23/2023 with a chief complaint of  Chief Complaint  Patient presents with   Weakness    Recent Results (from the past 720 hours)  Urine Culture     Status: Abnormal   Collection Time: 10/23/23  7:30 PM   Specimen: In/Out Cath Urine  Result Value Ref Range Status   Specimen Description   Final    IN/OUT CATH URINE Performed at [facility], [address] [place], [place] [address]    Special Requests   Final    NONE Performed at [facility], [address] [place], [place] [address]    Culture >=100,000 COLONIES/mL ESCHERICHIA COLI (A)  Final   Report Status 10/26/2023 FINAL  Final   Organism ID, Bacteria ESCHERICHIA COLI (A)  Final      Susceptibility   Escherichia coli - MIC*    AMPICILLIN >=32 RESISTANT Resistant     CEFAZOLIN 16 SENSITIVE Sensitive     CEFEPIME  <=0.12 SENSITIVE Sensitive     CEFTRIAXONE  <=0.25 SENSITIVE Sensitive     CIPROFLOXACIN <=0.25 SENSITIVE Sensitive     GENTAMICIN <=1 SENSITIVE Sensitive     IMIPENEM <=0.25 SENSITIVE Sensitive     NITROFURANTOIN <=16 SENSITIVE Sensitive     TRIMETH/SULFA <=20 SENSITIVE Sensitive     AMPICILLIN/SULBACTAM >=32 RESISTANT Resistant     PIP/TAZO <=4 SENSITIVE Sensitive ug/mL    * >=100,000 COLONIES/mL ESCHERICHIA COLI    [x]  Treated with Augmentin , organism resistant to prescribed antimicrobial  New antibiotic prescription: cephelexin 500 mg po TID x 7 days called to [facility] store [address], [place] via VM. I also left a message with the patient in a HIPAA compliant formatted voicemail  ED Provider: GORMAN Punter, MD   [name] 10/27/2023, 1:00 PM Clinical Pharmacist Monday - Friday phone -  [phone] - [phone]

## 2023-11-17 NOTE — Progress Notes (Signed)
 CHIEF COMPLAINT: F/u hematuria, recurrent UTI  HISTORY OF PRESENT ILLNESS: Bryan Howard is a 89 y.o. male who was initially referred for consultation by Jyl Heron Haff, MD for further evaluation of recurrent UTI.    INTERVAL HISTORY: he returns today in follow up.    The patient denies recurrent hematuria, dysuria, fever or chills. No flank pain.  Earlier today he underwent CT scan.    MEDICATIONS: Current Outpatient Medications  Medication Sig Dispense Refill  . acetaminophen (TYLENOL) 650 MG ER tablet Take 650 mg by mouth every 8 (eight) hours as needed for Pain    . alpha lipoic acid 200 mg Cap Take 200 mg by mouth once daily    . apixaban (ELIQUIS) 2.5 mg tablet Take 1 tablet (2.5 mg total) by mouth every 12 (twelve) hours 60 tablet 6  . ascorbic acid, vitamin C, (VITAMIN C) 500 MG tablet Take 500 mg by mouth once daily    . atorvastatin (LIPITOR) 80 MG tablet Take 1 tablet (80 mg total) by mouth once daily 100 tablet 3  . cholecalciferol (CHOLECALCIFEROL) 1000 unit tablet Take 1,000 Units by mouth once daily    . cyanocobalamin (VITAMIN B12) 1000 MCG tablet Take 1 tablet (1,000 mcg total) by mouth once daily 90 tablet 3  . DULoxetine (CYMBALTA) 30 MG DR capsule Take 1 capsule (30 mg total) by mouth once daily 100 capsule 3  . folic acid (FOLVITE) 800 MCG tablet Take 1 tablet by mouth once daily    . gabapentin (NEURONTIN) 300 MG capsule Take 1 capsule (300 mg total) by mouth at bedtime 100 capsule 2  . HYDROcodone-acetaminophen (NORCO) 7.5-325 mg tablet Take 1 tablet by mouth every morning    . levothyroxine (SYNTHROID) 112 MCG tablet Take 1 tablet (112 mcg total) by mouth once daily Take on an empty stomach with a glass of water at least 30-60 minutes before breakfast. 100 tablet 3  . losartan (COZAAR) 50 MG tablet Take 1 tablet (50 mg total) by mouth once daily    . naloxone (NARCAN) 4 mg/actuation nasal spray Place 1 spray (4 mg total) into one nostril once as  needed for up to 1 dose 1 each 0  . omeprazole (PRILOSEC) 20 MG DR capsule Take 1 capsule (20 mg total) by mouth 2 (two) times daily before meals    . oxyBUTYnin (DITROPAN-XL) 10 MG XL tablet TAKE 1 TABLET BY MOUTH ONCE  DAILY 90 tablet 3  . polyethylene glycol (MIRALAX) packet Take 1 packet (17 g total) by mouth once daily Mix in 4-8ounces of fluid prior to taking. (Patient taking differently: Take 17 g by mouth once daily as needed for Constipation Mix in 4-8ounces of fluid prior to taking.)    . sennosides (SENOKOT) 8.6 mg tablet Take 1 tablet by mouth 2 (two) times daily     Current Facility-Administered Medications  Medication Dose Route Frequency Provider Last Rate Last Admin  . cyanocobalamin (VITAMIN B12) injection 1,000 mcg  1,000 mcg Subcutaneous Q30 Days Jyl Heron Haff, MD   1,000 mcg at 07/11/23 1141  . lidocaine  (UROJET/GLYDO) 2 % jelly in applicator 10 mL  10 mL Urethral Once Scales, Carlin Vicenta Mickey., MD        ALLERGIES: Allergies  Allergen Reactions  . Captopril Cough  . Quinolones Other (See Comments)    AAA patient- avoid if possible    REVIEW OF SYSTEMS: 13 point ROS is otherwise negative.  PHYSICAL EXAM: BP 104/51 (BP Location: Right upper  arm, Patient Position: Sitting, BP Cuff Size: Adult)   Pulse 65   Temp 36.6 C (97.8 F) (Temporal Artery)   Resp 18   Ht 167.6 cm (5' 5.98)   Wt 80.7 kg (177 lb 14.6 oz)   BMI 28.73 kg/m   General Appearance:    Alert, cooperative, no distress, appears stated age  Head:    Normocephalic, without obvious abnormality, atraumatic  Neck:   Symmetric, trachea midline, no obvious masses  Lungs:     Respirations unlabored, no use of accessory muscles  Abdomen:     Soft, non-tender, non-distended.  Genitalia:    Not performed.  Extremities:   Extremities normal, atraumatic, no cyanosis or edema  Skin:   Skin color, texture, turgor normal, no rashes or lesions  Musculoskeletal:     Steady gait, ambulates unassisted   Neurologic:    Normal strength, sensation throughout    LABS: I personally reviewed and reviewed with the patient the results from  Lab Results  Component Value Date   CREATININE 1.0 09/28/2023    IMAGING:   I personally reviewed and reviewed with the patient the results from his GU protocol CT scan performed today November 17, 2023.  This is notable for air in the urinary bladder as well as small bilateral renal stones.  There is a small renal cyst in the right kidney. A loop of small bowel appears to closely abut the bladder, where bladder wall is thickened and air appears in the urinary bladder.  ASSESSMENT: 89 y.o. male with abnormal cystoscopic evaluation for hematuria/recurrent UTI. He has positive urine cultures (Aerococcus, Enterococcus) about monthly over the past 6 months.  There is significant clumping debris in the bladder visible on cystoscopy and air in the bladder on CT scan.  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  Due to limited visualization, and concern for fistula I recommended that we proceed to the operating room for cystoscopy with possible biopsy and more thorough visualization.  Risks include but not limited to infection, bleeding, injury to the bladder, additional procedures, and the risk of anesthesia.  Patient is amenable to proceeding.  The patient asked appropriate questions, verbalized understanding of the plan, and was in agreement to proceed.  There were no perceived barriers to communication.  Plan:  1. OR for cystoscopy, possible biopsy, cystogram 2. UCx 3. Bactrim SS x1 for prophylaxis

## 2023-11-17 NOTE — Progress Notes (Signed)
 Cystoscopy performed without stent removal. Verified consent was signed by patient and witnessed by provider. Patient offered chaperone for procedure prep and declined. Patient undressed from waist down, put on non-skid socks and disposable sheet draped over legs. Lidocaine  2% Urojet administered as ordered.  Time-out performed with patient, clinical staff and provider using two identifiers of name and date of birth from original source. Cystoscopy performed by Dr. Presley with disposable cystoscope. Pain reassessed after procedure and patient rated pain at 0/10. Patient tolerated procedure well. Post-procedure education reviewed with patient. Patient verbalized understanding and discharged with follow-up instructions as needed.

## 2023-11-19 ENCOUNTER — Emergency Department

## 2023-11-19 ENCOUNTER — Emergency Department
Admission: EM | Admit: 2023-11-19 | Discharge: 2023-11-19 | Disposition: A | Discharge status: Home / Self Care | Attending: Emergency Medicine | Admitting: Emergency Medicine

## 2023-11-19 ENCOUNTER — Other Ambulatory Visit: Payer: Self-pay

## 2023-11-19 DIAGNOSIS — W19XXXA Unspecified fall, initial encounter: Secondary | ICD-10-CM

## 2023-11-19 DIAGNOSIS — E039 Hypothyroidism, unspecified: Secondary | ICD-10-CM | POA: Diagnosis not present

## 2023-11-19 DIAGNOSIS — M25561 Pain in right knee: Secondary | ICD-10-CM | POA: Diagnosis present

## 2023-11-19 DIAGNOSIS — Y92009 Unspecified place in unspecified non-institutional (private) residence as the place of occurrence of the external cause: Secondary | ICD-10-CM | POA: Diagnosis not present

## 2023-11-19 DIAGNOSIS — M25562 Pain in left knee: Secondary | ICD-10-CM | POA: Insufficient documentation

## 2023-11-19 DIAGNOSIS — I2510 Atherosclerotic heart disease of native coronary artery without angina pectoris: Secondary | ICD-10-CM | POA: Insufficient documentation

## 2023-11-19 DIAGNOSIS — Z96653 Presence of artificial knee joint, bilateral: Secondary | ICD-10-CM | POA: Diagnosis not present

## 2023-11-19 LAB — COMPREHENSIVE METABOLIC PANEL WITH GFR
ALT: 18 U/L (ref 0–44)
AST: 20 U/L (ref 15–41)
Albumin: 3.4 g/dL — ABNORMAL LOW (ref 3.5–5.0)
Alkaline Phosphatase: 69 U/L (ref 38–126)
Anion gap: 7 (ref 5–15)
BUN: 24 mg/dL — ABNORMAL HIGH (ref 8–23)
CO2: 29 mmol/L (ref 22–32)
Calcium: 9.7 mg/dL (ref 8.9–10.3)
Chloride: 104 mmol/L (ref 98–111)
Creatinine, Ser: 0.99 mg/dL (ref 0.61–1.24)
GFR, Estimated: >60 mL/min (ref >60)
Glucose, Bld: 124 mg/dL — ABNORMAL HIGH (ref 70–99)
Potassium: 4.1 mmol/L (ref 3.5–5.1)
Sodium: 140 mmol/L (ref 135–145)
Total Bilirubin: 0.5 mg/dL (ref 0.0–1.2)
Total Protein: 7.1 g/dL (ref 6.5–8.1)

## 2023-11-19 LAB — CBC
HCT: 32.6 % — ABNORMAL LOW (ref 39.0–52.0)
Hemoglobin: 10.4 g/dL — ABNORMAL LOW (ref 13.0–17.0)
MCH: 33.3 pg (ref 26.0–34.0)
MCHC: 31.9 g/dL (ref 30.0–36.0)
MCV: 104.5 fL — ABNORMAL HIGH (ref 80.0–100.0)
Platelets: 149 K/uL — ABNORMAL LOW (ref 150–400)
RBC: 3.12 MIL/uL — ABNORMAL LOW (ref 4.22–5.81)
RDW: 14.1 % (ref 11.5–15.5)
WBC: 5.1 K/uL (ref 4.0–10.5)
nRBC: 0 % (ref 0.0–0.2)

## 2023-11-19 LAB — LIPASE, BLOOD: Lipase: 25 U/L (ref 11–51)

## 2023-11-19 MED ORDER — SODIUM CHLORIDE 0.9 % IV BOLUS
500.0000 mL | Freq: Once | INTRAVENOUS | Status: AC
  Administered 2023-11-19: 500 mL via INTRAVENOUS

## 2023-11-19 NOTE — ED Provider Notes (Signed)
 [facility] Provider Note    Event Date/Time   First MD Initiated Contact with Patient 11/19/23 [phone]     (approximate)   History   Fall   HPI  Bryan Howard. is a 89 y.o. male patient relates that he has been doing well he has been seeing his urologist at [facility] as of even just 2 days ago.  History of hypothyroidism GERD, coronary disease.  Recent history of frequent or consistent urinary tract infection with some concern for potential fistula between bowel and bladder developing over the last 6 months.  He had a procedure where they evaluated his bladder.  He was told that he needs further follow-up, and they did start him on daily preventative antibiotic.  Patient relates that he has been otherwise doing well, no fevers no chills.  He for a long time now due to chronic knee pain has used a power scooter in his home.  Today he was transferring from his power scooter to his hospital bed, but reports that the scooter shifted causing him to fall onto the floor.  He fell onto his knees.  He initially reported that he was having 10 out of 10 knee pain, but tells me that the head is actually a normal thing for him.  It is no different than typical.  He does not think he actually got injured.  Denies any hip pain.  Had to call EMS because his son was not able to get him off the floor.  He did not strike his head does not any neck pain.  He otherwise feels well but was not able to get from scooter to bed.  He has had no fever.  No headache no chest pain no trouble breathing no abdominal pain.  Supples chronic incontinence and reports that is not new, following closely with urology.  Tells me that his urologist has told him that if he had a problem with his bladder that he is recommended he come to [facility] as he may need a future procedure, though patient not exactly certain what that next procedure would be but told it would be a 'big surgery.'     Physical Exam   Triage  Vital Signs: ED Triage Vitals  Encounter Vitals Group     BP 11/19/23 0441 97/85     Girls Systolic BP Percentile --      Girls Diastolic BP Percentile --      Boys Systolic BP Percentile --      Boys Diastolic BP Percentile --      Pulse Rate 11/19/23 0441 70     Resp 11/19/23 0441 16     Temp 11/19/23 0441 98.4 F (36.9 C)     Temp Source 11/19/23 0441 Oral     SpO2 11/19/23 0441 98 %     Weight 11/19/23 0456 180 lb (81.6 kg)     Height 11/19/23 0456 5' 6 (1.676 m)     Head Circumference --      Peak Flow --      Pain Score 11/19/23 0442 10     Pain Loc --      Pain Education --      Exclude from Growth Chart --     Most recent vital signs: Vitals:   11/19/23 0613 11/19/23 0630  BP:  123/61  Pulse: 66 (!) 51  Resp:  (!) 22  Temp:    SpO2:  97%     General: Awake,  no distress.  Normocephalic atraumatic.  He is very well-oriented very pleasant in no distress. CV:  Good peripheral perfusion.  Normal tones and rate Resp:  Normal effort.  Clear bilateral normal work of breathing Abd:  No distention.  Soft nontender nondistended throughout.  Obese.  Wearing depends, patient reports that his normal he has chronic incontinence Other:  Old knee replacements bilateral.  No evidence of bruising or contusion to lower extremities bilateral.  Triage note reports the patient had shortening and rotation of the hip, but the patient is able to demonstrate normal range of motion through both hips lift both legs off the bed bend at the knees has no pain or discomfort.  Through both passive and active range of motion and palpation he demonstrates no tenderness over his trochanteric regions, femur, knees, ankles and feet bilateral.   ED Results / Procedures / Treatments   Labs (all labs ordered are listed, but only abnormal results are displayed) Labs Reviewed  CBC - Abnormal; Notable for the following components:      Result Value   RBC 3.12 (*)    Hemoglobin 10.4 (*)    HCT 32.6 (*)     MCV 104.5 (*)    Platelets 149 (*)    All other components within normal limits  COMPREHENSIVE METABOLIC PANEL WITH GFR - Abnormal; Notable for the following components:   Glucose, Bld 124 (*)    BUN 24 (*)    Albumin 3.4 (*)    All other components within normal limits  LIPASE, BLOOD     EKG  And interpreted by me at 6 AM heart rate 50 QRS 90 QTc 420 no evidence of acute ischemia.  First-degree AV block. 1st degree block is not new compared to prior   RADIOLOGY  X-rays knees bilaterally inter by me is negative for acute fracture grossly   DG Knee 2 Views Right Result Date: 11/19/2023 EXAM: 1 or 2 VIEW(S) XRAY OF THE RIGHT KNEE 11/19/2023 05:22:29 AM COMPARISON: None available. CLINICAL HISTORY: Fall onto knees. From [facility] with c/o fall. Reports his son was assisting him from his scooter chair to his medical bed when he fell and landed on both knees. FINDINGS: BONES AND JOINTS: Status post total knee arthroplasty. No acute fracture. No focal osseous lesion. No joint dislocation. No significant joint effusion. No significant degenerative changes. SOFT TISSUES: Atherosclerotic calcification is identified posterior to the distal femur and knee. IMPRESSION: 1. No acute fracture or dislocation. 2. Status post total knee arthroplasty. 3. Atherosclerotic calcification posterior to the distal femur and knee. Electronically signed by: Waddell Calk MD 11/19/2023 06:00 AM EDT RP Workstation: [id number]   DG Knee 2 Views Left Result Date: 11/19/2023 EXAM: 1 or 2 VIEW(S) XRAY OF THE LEFT KNEE 11/19/2023 05:22:29 AM COMPARISON: None available. CLINICAL HISTORY: Fall onto knees. From [facility] with c/o fall. Reports his son was assisting him from his scooter chair to his medical bed when he fell and landed on both knees. FINDINGS: BONES AND JOINTS: Status post total knee arthroplasty. No acute fracture. No focal osseous lesion. No joint dislocation. No significant  joint effusion. No significant degenerative changes. SOFT TISSUES: Atherosclerotic calcifications noted posterior to the knee. The remaining soft tissues are unremarkable. IMPRESSION: 1. No acute fracture or dislocation. 2. Status post total knee arthroplasty. Electronically signed by: Waddell Calk MD 11/19/2023 05:59 AM EDT RP Workstation: [id number]      PROCEDURES:  Critical Care performed: No  Procedures   MEDICATIONS  ORDERED IN ED: Medications  sodium chloride  0.9 % bolus 500 mL (500 mLs Intravenous New Bag/Given 11/19/23 0534)     IMPRESSION / MDM / ASSESSMENT AND PLAN / ED COURSE  I reviewed the triage vital signs and the nursing notes.                              Differential diagnosis includes, but is not limited to, mechanical fall, generalized weakness, deconditioning, poor balance, causes such as dehydration renal disease electrolyte imbalance dysrhythmia cardiac disease etc. seems far less likely.  Patient reports chronic use of power wheelchair, reports he is transferring today he lost balance in the wheelchair sort of slipped as he was transferring to bed causing follow-up between.  No evidence of injury.  Demonstrates good range of motion has chronic bilateral knee pain, no acute findings involving the hips.  He is able to range lower extremities well denies head injury takes no anticoagulants denies neck pain.  Reports he did not fall striking his head chest or abdomen but fell directly onto his knees and could not get himself up.  Vitals:   11/19/23 0613 11/19/23 0630  BP:  123/61  Pulse: 66 (!) 51  Resp:  (!) 22  Temp:    SpO2:  97%     Patient's presentation is most consistent with acute complicated illness / injury requiring diagnostic workup.        Per review of [facility] urology note from 2 days ago 89 y.o. male with abnormal cystoscopic evaluation for hematuria/recurrent UTI. He has positive urine cultures (Aerococcus, Enterococcus) about monthly over  the past 6 months. There is significant clumping debris in the bladder visible on cystoscopy and air in the bladder on CT scan. 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 Due to limited visualization, and concern for fistula I recommended that we proceed to the operating room for cystoscopy with possible biopsy and more thorough visualization.  In discussion with the patient and review, it seems that his urologic issues are being followed closely by urologist he has a history of chronic urinary infection.  He does not however endorse any symptoms of systemic illness, fever, dysuria or changes in his urinary symptoms but does endorse chronic incontinence.  Additionally appears he underwent CT scan at [facility] 2 days ago as well which the patient reports was completed and also had cystoscopy.  The patient does tell me that his urologist has planned further evaluation and has recommended he undergo further workup.  It does not appear that he is suffering acute urologic symptomatology at this time though, and urinary symptoms appear fairly chronic and well followed.  He does tell me that he was told by his doctor that if he is to develop symptoms like an infection or fever then he was to go to the [facility] ER, but at the moment he does not endorse any of those or have any of the symptoms  ----------------------------------------- 6:30 AM on 11/19/2023 ----------------------------------------- Resting comfortably normotensive fully alert.  Has ongoing care and treatment with urology, currently at his normal.  Patient comfortable with plan to discharge to his home.  Advises his son should be coming over to get him.  Return precautions and treatment recommendations and follow-up discussed with the patient who is agreeable with the plan.   FINAL CLINICAL IMPRESSION(S) / ED DIAGNOSES   Final diagnoses:  Fall, initial encounter     Rx / DC Orders  ED Discharge Orders     None        Note:  This  document was prepared using Dragon voice recognition software and may include unintentional dictation errors.   Dicky Anes, MD 11/19/23 [phone]

## 2023-11-19 NOTE — ED Notes (Signed)
 EKG obtained. Given to Oneil Budge, MD.

## 2023-11-19 NOTE — ED Notes (Addendum)
 20 G IV inserted in RAC x 1 attempt with good blood return. Blood work obtained and sent to lab.

## 2023-11-19 NOTE — ED Triage Notes (Signed)
 Pt arrives via [facility] from [facility] with c/o fall. Reports his son was assisting him from is scooter chair to his medical bed when he fell and landed on both knees. Denies hitting head or LOC. Reports bilateral knee pain 10/10. No obvious injury noted to knees. Some external rotation and slight shortening noted in left leg. Also reports having chronic pain from arthritis and has been treating a chronic UTI x 1 year. Reported to have received an injection of an antibiotic on Thursday. Pt alert and oriented x 4 at this time.

## 2023-12-08 ENCOUNTER — Ambulatory Visit: Payer: PRIVATE HEALTH INSURANCE | Admitting: Urology

## 2023-12-27 ENCOUNTER — Ambulatory Visit: Payer: PRIVATE HEALTH INSURANCE | Admitting: Urology

## 2024-01-17 ENCOUNTER — Emergency Department

## 2024-01-17 ENCOUNTER — Emergency Department
Admission: EM | Admit: 2024-01-17 | Discharge: 2024-01-18 | Disposition: A | Discharge status: Home / Self Care | Attending: Emergency Medicine | Admitting: Emergency Medicine

## 2024-01-17 ENCOUNTER — Other Ambulatory Visit: Payer: Self-pay

## 2024-01-17 DIAGNOSIS — K5900 Constipation, unspecified: Secondary | ICD-10-CM

## 2024-01-17 DIAGNOSIS — E039 Hypothyroidism, unspecified: Secondary | ICD-10-CM | POA: Diagnosis not present

## 2024-01-17 DIAGNOSIS — I2510 Atherosclerotic heart disease of native coronary artery without angina pectoris: Secondary | ICD-10-CM | POA: Insufficient documentation

## 2024-01-17 DIAGNOSIS — K5641 Fecal impaction: Secondary | ICD-10-CM | POA: Diagnosis not present

## 2024-01-17 LAB — COMPREHENSIVE METABOLIC PANEL WITH GFR
ALT: 21 U/L (ref 0–44)
AST: 29 U/L (ref 15–41)
Albumin: 3.7 g/dL (ref 3.5–5.0)
Alkaline Phosphatase: 71 U/L (ref 38–126)
Anion gap: 14 (ref 5–15)
BUN: 33 mg/dL — ABNORMAL HIGH (ref 8–23)
CO2: 25 mmol/L (ref 22–32)
Calcium: 9.9 mg/dL (ref 8.9–10.3)
Chloride: 104 mmol/L (ref 98–111)
Creatinine, Ser: 1.09 mg/dL (ref 0.61–1.24)
GFR, Estimated: >60 mL/min (ref >60)
Glucose, Bld: 115 mg/dL — ABNORMAL HIGH (ref 70–99)
Potassium: 3.9 mmol/L (ref 3.5–5.1)
Sodium: 143 mmol/L (ref 135–145)
Total Bilirubin: 0.8 mg/dL (ref 0.0–1.2)
Total Protein: 7.5 g/dL (ref 6.5–8.1)

## 2024-01-17 LAB — CBC WITH DIFFERENTIAL/PLATELET
Abs Immature Granulocytes: 0.1 K/uL — ABNORMAL HIGH (ref 0.00–0.07)
Basophils Absolute: 0 K/uL (ref 0.0–0.1)
Basophils Relative: 0 %
Eosinophils Absolute: 0 K/uL (ref 0.0–0.5)
Eosinophils Relative: 0 %
HCT: 37.3 % — ABNORMAL LOW (ref 39.0–52.0)
Hemoglobin: 12.2 g/dL — ABNORMAL LOW (ref 13.0–17.0)
Immature Granulocytes: 1 %
Lymphocytes Relative: 15 %
Lymphs Abs: 1.5 K/uL (ref 0.7–4.0)
MCH: 33.6 pg (ref 26.0–34.0)
MCHC: 32.7 g/dL (ref 30.0–36.0)
MCV: 102.8 fL — ABNORMAL HIGH (ref 80.0–100.0)
Monocytes Absolute: 0.6 K/uL (ref 0.1–1.0)
Monocytes Relative: 6 %
Neutro Abs: 7.7 K/uL (ref 1.7–7.7)
Neutrophils Relative %: 78 %
Platelets: 193 K/uL (ref 150–400)
RBC: 3.63 MIL/uL — ABNORMAL LOW (ref 4.22–5.81)
RDW: 14 % (ref 11.5–15.5)
WBC: 10 K/uL (ref 4.0–10.5)
nRBC: 0 % (ref 0.0–0.2)

## 2024-01-17 LAB — TROPONIN I (HIGH SENSITIVITY): Troponin I (High Sensitivity): 27 ng/L — ABNORMAL HIGH (ref <18)

## 2024-01-17 NOTE — ED Provider Notes (Signed)
 [facility] Provider Note    Event Date/Time   First MD Initiated Contact with Patient 01/17/24 2123     (approximate)   History   Chief Complaint Constipation   HPI  Bryan Howard. is a 89 y.o. male with past medical history of hyperlipidemia, CAD, hypothyroidism, and AAA who presents to the ED complaining of constipation.  Patient reports that he has been dealing with increasing pain around his rectum over the past 2 days, has not had a bowel movement over this time.  He denies any associated abdominal pain and has not had any nausea or vomiting.  He reports taking a laxative earlier today without relief.  Patient reportedly became diaphoretic with drop in BP during EMS transport, denies any associated chest pain or shortness of breath.     Physical Exam   Triage Vital Signs: ED Triage Vitals [01/17/24 2123]  Encounter Vitals Group     BP 113/69     Girls Systolic BP Percentile      Girls Diastolic BP Percentile      Boys Systolic BP Percentile      Boys Diastolic BP Percentile      Pulse Rate 91     Resp 18     Temp 98.3 F (36.8 C)     Temp Source Oral     SpO2 95 %     Weight      Height      Head Circumference      Peak Flow      Pain Score 9     Pain Loc      Pain Education      Exclude from Growth Chart     Most recent vital signs: Vitals:   01/17/24 2123  BP: 113/69  Pulse: 91  Resp: 18  Temp: 98.3 F (36.8 C)  SpO2: 95%    Constitutional: Alert and oriented. Eyes: Conjunctivae are normal. Head: Atraumatic. Nose: No congestion/rhinnorhea. Mouth/Throat: Mucous membranes are moist.  Cardiovascular: Normal rate, regular rhythm. Grossly normal heart sounds.  2+ radial pulses bilaterally. Respiratory: Normal respiratory effort.  No retractions. Lungs CTAB. Gastrointestinal: Soft and nontender. No distention.  Rectal exam with large amount of stool in the rectal vault. Musculoskeletal: No lower extremity tenderness nor  edema.  Neurologic:  Normal speech and language. No gross focal neurologic deficits are appreciated.    ED Results / Procedures / Treatments   Labs (all labs ordered are listed, but only abnormal results are displayed) Labs Reviewed  CBC WITH DIFFERENTIAL/PLATELET - Abnormal; Notable for the following components:      Result Value   RBC 3.63 (*)    Hemoglobin 12.2 (*)    HCT 37.3 (*)    MCV 102.8 (*)    Abs Immature Granulocytes 0.10 (*)    All other components within normal limits  COMPREHENSIVE METABOLIC PANEL WITH GFR - Abnormal; Notable for the following components:   Glucose, Bld 115 (*)    BUN 33 (*)    All other components within normal limits  TROPONIN I (HIGH SENSITIVITY) - Abnormal; Notable for the following components:   Troponin I (High Sensitivity) 27 (*)    All other components within normal limits     EKG  ED ECG REPORT I, Carlin Palin, the attending physician, personally viewed and interpreted this ECG.   Date: 01/17/2024  EKG Time: 21:33  Rate: 85  Rhythm: normal sinus rhythm  Axis: LAD  Intervals:none  ST&T Change:  None  PROCEDURES: ------------------------------------------------------------------------------------------------------------------- Fecal Disimpaction Procedure Note:  Performed by me:  Patient placed in the lateral recumbent position with knees drawn towards chest. Nurse present for patient support. Large amount of hard brown stool removed. No complications during procedure.   ------------------------------------------------------------------------------------------------------------------   Critical Care performed: No  Procedures   MEDICATIONS ORDERED IN ED: Medications - No data to display   IMPRESSION / MDM / ASSESSMENT AND PLAN / ED COURSE  I reviewed the triage vital signs and the nursing notes.                              89 y.o. male with past medical history of hyperlipidemia, CAD, hypothyroidism, and AAA  who presents to the ED complaining of 2 days of rectal pain and constipation.  Patient's presentation is most consistent with acute presentation with potential threat to life or bodily function.  Differential diagnosis includes, but is not limited to, constipation, bowel obstruction, fecal impaction.  Patient well-appearing and in no acute distress, vital signs are unremarkable.  He has a benign abdominal exam, rectal exam with large amount of stool in the rectal vault, which was manually disimpacted.  Patient had significant relief in symptoms following manual disimpaction, no findings concerning for bowel obstruction and do not feel CT imaging indicated at this time.  Labs without significant anemia, leukocytosis, electrolyte abnormality, or AKI.  LFTs are unremarkable, troponin noted to be mildly elevated with no recent levels for comparison.  EKG shows no evidence of arrhythmia or ischemia and no symptoms to suggest ACS at this time.  We will check second set troponin but if this is unremarkable he would be appropriate for discharge home with PCP follow-up.      FINAL CLINICAL IMPRESSION(S) / ED DIAGNOSES   Final diagnoses:  Constipation, unspecified constipation type  Fecal impaction (HCC)     Rx / DC Orders   ED Discharge Orders     None        Note:  This document was prepared using Dragon voice recognition software and may include unintentional dictation errors.   Willo Dunnings, MD 01/17/24 [phone]

## 2024-01-17 NOTE — Discharge Instructions (Signed)
 You were seen in the emergency department today for constipation.  We recommend that you use one or more of the following over-the-counter medications in the order described:   1)  Colace (or Dulcolax) 100 mg:  This is a stool softener, and you may take it once or twice a day as needed. 2)  Senna tablets:  This is a bowel stimulant that will help "push" out your stool. It is the next step to add after you have tried a stool softener. 3)  Miralax (powder):  This medication works by drawing additional fluid into your intestines and helps to flush out your stool.  Mix the powder with water or juice according to label instructions.  It may help if the Colace and Senna are not sufficient, but you must be sure to use the recommended amount of water or juice when you mix up the powder. 4)  Look for magnesium citrate at the pharmacy (it is usually a small glass bottle).  Drink the bottle according to the label instructions.  Remember that narcotic pain medications are constipating, so avoid them or minimize their use.  Drink plenty of fluids.  Please return to the Emergency Department immediately if you develop new or worsening symptoms that concern you, such as (but not limited to) fever > 101 degrees, severe abdominal pain, or persistent vomiting.

## 2024-01-17 NOTE — ED Triage Notes (Signed)
 BIB [facility] from home with CC of constipation with last bowel movement x2 days ago. On transport pt became diaphoretic and pressure went from 128/78 to 105/66. Pt A&Ox4.

## 2024-01-18 DIAGNOSIS — K5641 Fecal impaction: Secondary | ICD-10-CM | POA: Diagnosis not present

## 2024-01-18 LAB — TROPONIN I (HIGH SENSITIVITY): Troponin I (High Sensitivity): 25 ng/L — ABNORMAL HIGH (ref <18)

## 2024-01-18 MED ORDER — SODIUM CHLORIDE 0.9 % IV BOLUS
500.0000 mL | INTRAVENOUS | Status: AC
  Administered 2024-01-18: 500 mL via INTRAVENOUS

## 2024-01-18 NOTE — ED Provider Notes (Signed)
 Procedures     ----------------------------------------- 3:40 AM on 01/18/2024 ----------------------------------------- Serial troponins were reassuring.  Afterward, patient stood up and got dizzy, had borderline low blood pressure.  Was given IV fluids for hydration, after which his orthostatics are normal and dizziness is resolved.  Stable for discharge.     Viviann Pastor, MD 01/18/24 [phone]

## 2024-01-18 NOTE — ED Notes (Signed)
 In room to D/C pt and BP low. Will notify MD

## 2024-01-26 NOTE — Progress Notes (Signed)
 [facility] RN - Second Level Medication Reconciliation Review Date:  01/26/2024 Name:  Bryan Howard PCP:  Jyl Heron Haff, MD   Reason for Encounter:  Bryan Howard   whose chart was reviewed by an RN team lead for a second level medication reconciliation.     RN Chart Review:   Reconciled current and discharge medications: Yes  The following was identified during the second level medication reconciliation:   No issues found, medication reconciliation complete   RN Suggested Interventions/Conclusion:  The medication reconciliation is now complete with no identified issues.  No further action on med rec is needed at this time.    [name]    [address]; [place], [place] [address] l  [email or web address] l [phone]   For more information on [facility] services, click here.

## 2024-01-27 ENCOUNTER — Inpatient Hospital Stay
Admission: EM | Admit: 2024-01-27 | Discharge: 2024-01-30 | DRG: 689 | Disposition: A | Discharge status: Home Health | Payer: PRIVATE HEALTH INSURANCE | Attending: Internal Medicine | Admitting: Internal Medicine

## 2024-01-27 ENCOUNTER — Emergency Department: Payer: PRIVATE HEALTH INSURANCE

## 2024-01-27 ENCOUNTER — Other Ambulatory Visit: Payer: Self-pay

## 2024-01-27 DIAGNOSIS — I493 Ventricular premature depolarization: Secondary | ICD-10-CM | POA: Diagnosis present

## 2024-01-27 DIAGNOSIS — G9341 Metabolic encephalopathy: Secondary | ICD-10-CM | POA: Diagnosis present

## 2024-01-27 DIAGNOSIS — Z1624 Resistance to multiple antibiotics: Secondary | ICD-10-CM | POA: Diagnosis present

## 2024-01-27 DIAGNOSIS — Z87440 Personal history of urinary (tract) infections: Secondary | ICD-10-CM

## 2024-01-27 DIAGNOSIS — Z79899 Other long term (current) drug therapy: Secondary | ICD-10-CM

## 2024-01-27 DIAGNOSIS — D696 Thrombocytopenia, unspecified: Secondary | ICD-10-CM | POA: Diagnosis present

## 2024-01-27 DIAGNOSIS — I10 Essential (primary) hypertension: Secondary | ICD-10-CM | POA: Diagnosis present

## 2024-01-27 DIAGNOSIS — M25511 Pain in right shoulder: Secondary | ICD-10-CM | POA: Diagnosis present

## 2024-01-27 DIAGNOSIS — B961 Klebsiella pneumoniae [K. pneumoniae] as the cause of diseases classified elsewhere: Secondary | ICD-10-CM | POA: Diagnosis present

## 2024-01-27 DIAGNOSIS — Z7982 Long term (current) use of aspirin: Secondary | ICD-10-CM

## 2024-01-27 DIAGNOSIS — N390 Urinary tract infection, site not specified: Secondary | ICD-10-CM | POA: Diagnosis not present

## 2024-01-27 DIAGNOSIS — Z9079 Acquired absence of other genital organ(s): Secondary | ICD-10-CM

## 2024-01-27 DIAGNOSIS — Z96653 Presence of artificial knee joint, bilateral: Secondary | ICD-10-CM | POA: Diagnosis present

## 2024-01-27 DIAGNOSIS — I959 Hypotension, unspecified: Secondary | ICD-10-CM

## 2024-01-27 DIAGNOSIS — Z79890 Hormone replacement therapy (postmenopausal): Secondary | ICD-10-CM

## 2024-01-27 DIAGNOSIS — I110 Hypertensive heart disease with heart failure: Secondary | ICD-10-CM | POA: Diagnosis present

## 2024-01-27 DIAGNOSIS — G9340 Encephalopathy, unspecified: Principal | ICD-10-CM

## 2024-01-27 DIAGNOSIS — Z8546 Personal history of malignant neoplasm of prostate: Secondary | ICD-10-CM

## 2024-01-27 DIAGNOSIS — Z9181 History of falling: Secondary | ICD-10-CM

## 2024-01-27 DIAGNOSIS — R6510 Systemic inflammatory response syndrome (SIRS) of non-infectious origin without acute organ dysfunction: Secondary | ICD-10-CM | POA: Diagnosis present

## 2024-01-27 DIAGNOSIS — Z66 Do not resuscitate: Secondary | ICD-10-CM | POA: Diagnosis present

## 2024-01-27 DIAGNOSIS — Z86711 Personal history of pulmonary embolism: Secondary | ICD-10-CM

## 2024-01-27 DIAGNOSIS — Z9889 Other specified postprocedural states: Secondary | ICD-10-CM

## 2024-01-27 DIAGNOSIS — I5032 Chronic diastolic (congestive) heart failure: Secondary | ICD-10-CM | POA: Diagnosis present

## 2024-01-27 DIAGNOSIS — I350 Nonrheumatic aortic (valve) stenosis: Secondary | ICD-10-CM | POA: Diagnosis present

## 2024-01-27 DIAGNOSIS — Z888 Allergy status to other drugs, medicaments and biological substances status: Secondary | ICD-10-CM

## 2024-01-27 DIAGNOSIS — Z8673 Personal history of transient ischemic attack (TIA), and cerebral infarction without residual deficits: Secondary | ICD-10-CM

## 2024-01-27 DIAGNOSIS — Z8679 Personal history of other diseases of the circulatory system: Secondary | ICD-10-CM

## 2024-01-27 DIAGNOSIS — I2510 Atherosclerotic heart disease of native coronary artery without angina pectoris: Secondary | ICD-10-CM | POA: Diagnosis present

## 2024-01-27 DIAGNOSIS — E039 Hypothyroidism, unspecified: Secondary | ICD-10-CM | POA: Diagnosis present

## 2024-01-27 DIAGNOSIS — Z87891 Personal history of nicotine dependence: Secondary | ICD-10-CM

## 2024-01-27 DIAGNOSIS — Z8711 Personal history of peptic ulcer disease: Secondary | ICD-10-CM

## 2024-01-27 DIAGNOSIS — F03A0 Unspecified dementia, mild, without behavioral disturbance, psychotic disturbance, mood disturbance, and anxiety: Secondary | ICD-10-CM | POA: Diagnosis present

## 2024-01-27 DIAGNOSIS — R531 Weakness: Secondary | ICD-10-CM | POA: Diagnosis present

## 2024-01-27 DIAGNOSIS — N321 Vesicointestinal fistula: Secondary | ICD-10-CM | POA: Diagnosis present

## 2024-01-27 DIAGNOSIS — J449 Chronic obstructive pulmonary disease, unspecified: Secondary | ICD-10-CM | POA: Diagnosis present

## 2024-01-27 DIAGNOSIS — E785 Hyperlipidemia, unspecified: Secondary | ICD-10-CM | POA: Diagnosis present

## 2024-01-27 DIAGNOSIS — M25512 Pain in left shoulder: Secondary | ICD-10-CM | POA: Diagnosis present

## 2024-01-27 DIAGNOSIS — Z800 Family history of malignant neoplasm of digestive organs: Secondary | ICD-10-CM

## 2024-01-27 DIAGNOSIS — G4733 Obstructive sleep apnea (adult) (pediatric): Secondary | ICD-10-CM | POA: Diagnosis present

## 2024-01-27 LAB — COMPREHENSIVE METABOLIC PANEL WITH GFR
ALT: 13 U/L (ref 0–44)
AST: 18 U/L (ref 15–41)
Albumin: 3.7 g/dL (ref 3.5–5.0)
Alkaline Phosphatase: 73 U/L (ref 38–126)
Anion gap: 11 (ref 5–15)
BUN: 19 mg/dL (ref 8–23)
CO2: 27 mmol/L (ref 22–32)
Calcium: 9.5 mg/dL (ref 8.9–10.3)
Chloride: 98 mmol/L (ref 98–111)
Creatinine, Ser: 0.72 mg/dL (ref 0.61–1.24)
GFR, Estimated: >60 mL/min (ref >60)
Glucose, Bld: 111 mg/dL — ABNORMAL HIGH (ref 70–99)
Potassium: 3.8 mmol/L (ref 3.5–5.1)
Sodium: 136 mmol/L (ref 135–145)
Total Bilirubin: 1 mg/dL (ref 0.0–1.2)
Total Protein: 7.5 g/dL (ref 6.5–8.1)

## 2024-01-27 LAB — URINALYSIS, ROUTINE W REFLEX MICROSCOPIC
Bilirubin Urine: NEGATIVE
Glucose, UA: NEGATIVE mg/dL
Hgb urine dipstick: NEGATIVE
Ketones, ur: NEGATIVE mg/dL
Nitrite: POSITIVE — AB
Protein, ur: 30 mg/dL — AB
Specific Gravity, Urine: 1.017 (ref 1.005–1.030)
WBC, UA: >50 WBC/hpf (ref 0–5)
pH: 5 (ref 5.0–8.0)

## 2024-01-27 LAB — CBC WITH DIFFERENTIAL/PLATELET
Abs Immature Granulocytes: 0.06 K/uL (ref 0.00–0.07)
Basophils Absolute: 0 K/uL (ref 0.0–0.1)
Basophils Relative: 0 %
Eosinophils Absolute: 0 K/uL (ref 0.0–0.5)
Eosinophils Relative: 0 %
HCT: 33.4 % — ABNORMAL LOW (ref 39.0–52.0)
Hemoglobin: 10.9 g/dL — ABNORMAL LOW (ref 13.0–17.0)
Immature Granulocytes: 1 %
Lymphocytes Relative: 9 %
Lymphs Abs: 0.8 K/uL (ref 0.7–4.0)
MCH: 33.3 pg (ref 26.0–34.0)
MCHC: 32.6 g/dL (ref 30.0–36.0)
MCV: 102.1 fL — ABNORMAL HIGH (ref 80.0–100.0)
Monocytes Absolute: 0.8 K/uL (ref 0.1–1.0)
Monocytes Relative: 8 %
Neutro Abs: 7.5 K/uL (ref 1.7–7.7)
Neutrophils Relative %: 82 %
Platelets: 130 K/uL — ABNORMAL LOW (ref 150–400)
RBC: 3.27 MIL/uL — ABNORMAL LOW (ref 4.22–5.81)
RDW: 14.1 % (ref 11.5–15.5)
WBC: 9.1 K/uL (ref 4.0–10.5)
nRBC: 0 % (ref 0.0–0.2)

## 2024-01-27 LAB — URINE DRUG SCREEN, QUALITATIVE (ARMC ONLY)
Amphetamines, Ur Screen: NOT DETECTED
Barbiturates, Ur Screen: NOT DETECTED
Benzodiazepine, Ur Scrn: NOT DETECTED
Cannabinoid 50 Ng, Ur ~~LOC~~: NOT DETECTED
Cocaine Metabolite,Ur ~~LOC~~: NOT DETECTED
MDMA (Ecstasy)Ur Screen: NOT DETECTED
Methadone Scn, Ur: NOT DETECTED
Opiate, Ur Screen: POSITIVE — AB
Phencyclidine (PCP) Ur S: NOT DETECTED
Tricyclic, Ur Screen: NOT DETECTED

## 2024-01-27 LAB — LIPASE, BLOOD: Lipase: 21 U/L (ref 11–51)

## 2024-01-27 LAB — BLOOD GAS, VENOUS

## 2024-01-27 LAB — PROCALCITONIN: Procalcitonin: <0.1 ng/mL

## 2024-01-27 LAB — LACTIC ACID, PLASMA: Lactic Acid, Venous: 0.8 mmol/L (ref 0.5–1.9)

## 2024-01-27 LAB — TSH: TSH: 0.259 u[IU]/mL — ABNORMAL LOW (ref 0.350–4.500)

## 2024-01-27 MED ORDER — LEVOTHYROXINE SODIUM 112 MCG PO TABS
112.0000 ug | ORAL_TABLET | Freq: Every day | ORAL | Status: DC
  Administered 2024-01-28 – 2024-01-30 (×3): 112 ug via ORAL
  Filled 2024-01-27 (×3): qty 1

## 2024-01-27 MED ORDER — LACTATED RINGERS IV SOLN: 150.0000 mL/h | INTRAVENOUS | Status: DC

## 2024-01-27 MED ORDER — SODIUM CHLORIDE 0.9 % IV SOLN
1.0000 g | INTRAVENOUS | Status: DC
  Administered 2024-01-28 – 2024-01-29 (×2): 1 g via INTRAVENOUS
  Filled 2024-01-27 (×2): qty 10

## 2024-01-27 MED ORDER — ALBUTEROL SULFATE (2.5 MG/3ML) 0.083% IN NEBU: 2.5000 mg | INHALATION_SOLUTION | RESPIRATORY_TRACT | Status: DC | PRN

## 2024-01-27 MED ORDER — ENOXAPARIN SODIUM 40 MG/0.4ML IJ SOSY
40.0000 mg | PREFILLED_SYRINGE | INTRAMUSCULAR | Status: DC
  Administered 2024-01-27 – 2024-01-29 (×3): 40 mg via SUBCUTANEOUS
  Filled 2024-01-27 (×3): qty 0.4

## 2024-01-27 MED ORDER — ATORVASTATIN CALCIUM 20 MG PO TABS
80.0000 mg | ORAL_TABLET | Freq: Every day | ORAL | Status: DC
  Administered 2024-01-28 – 2024-01-30 (×3): 80 mg via ORAL
  Filled 2024-01-27 (×3): qty 4

## 2024-01-27 MED ORDER — OXYBUTYNIN CHLORIDE ER 10 MG PO TB24: 10.0000 mg | ORAL_TABLET | Freq: Every day | ORAL | Status: DC

## 2024-01-27 MED ORDER — CEFTRIAXONE SODIUM 1 G IJ SOLR
1.0000 g | Freq: Once | INTRAMUSCULAR | Status: AC
  Administered 2024-01-27: 1 g via INTRAVENOUS
  Filled 2024-01-27: qty 10

## 2024-01-27 MED ORDER — SODIUM CHLORIDE 0.9 % IV BOLUS
500.0000 mL | Freq: Once | INTRAVENOUS | Status: AC
  Administered 2024-01-27: 500 mL via INTRAVENOUS

## 2024-01-27 MED ORDER — LACTATED RINGERS IV SOLN
150.0000 mL/h | INTRAVENOUS | Status: AC
  Administered 2024-01-27: 150 mL/h via INTRAVENOUS

## 2024-01-27 MED ORDER — ONDANSETRON HCL 4 MG/2ML IJ SOLN: 4.0000 mg | Freq: Four times a day (QID) | INTRAMUSCULAR | Status: DC | PRN

## 2024-01-27 MED ORDER — ACETAMINOPHEN 325 MG PO TABS: 650.0000 mg | ORAL_TABLET | Freq: Four times a day (QID) | ORAL | Status: DC | PRN

## 2024-01-27 MED ORDER — ASPIRIN 81 MG PO TBEC
81.0000 mg | DELAYED_RELEASE_TABLET | Freq: Every day | ORAL | Status: DC
  Administered 2024-01-28 – 2024-01-30 (×3): 81 mg via ORAL
  Filled 2024-01-27 (×3): qty 1

## 2024-01-27 MED ORDER — HYDROCODONE-ACETAMINOPHEN 5-325 MG PO TABS: 1.0000 | ORAL_TABLET | ORAL | Status: DC | PRN

## 2024-01-27 MED ORDER — APIXABAN 2.5 MG PO TABS: 2.5000 mg | ORAL_TABLET | Freq: Two times a day (BID) | ORAL | Status: DC

## 2024-01-27 MED ORDER — ACETAMINOPHEN 650 MG RE SUPP: 650.0000 mg | Freq: Four times a day (QID) | RECTAL | Status: DC | PRN

## 2024-01-27 MED ORDER — ONDANSETRON HCL 4 MG PO TABS: 4.0000 mg | ORAL_TABLET | Freq: Four times a day (QID) | ORAL | Status: DC | PRN

## 2024-01-27 NOTE — Assessment & Plan Note (Signed)
 PE in June 2024 Eliquis recently discontinued by PCP on 01/16/2024

## 2024-01-27 NOTE — IPAL (Signed)
  Interdisciplinary Goals of Care Family Meeting   Date carried out: 01/27/2024  Location of the meeting: Phone conference  Member's involved: Physician and Family Member or next of kin  Durable Power of Attorney or acting medical decision maker: Son, Derris Millan    Discussion: We discussed goals of care for Bryan Howard. .   I have reviewed medical records including EPIC notes, labs and imaging. Discussed major active diagnoses, plan of care, natural trajectory, prognosis, GOC, EOL wishes, disposition and options including .  Questions and concerns were addressed.  Election for DNR status.   Code status:   Code Status: Limited: Do not attempt resuscitation (DNR) -DNR-LIMITED -Do Not Intubate/DNI    Disposition: Continue current acute care  Time spent for the meeting: 28    Delayne LULLA Solian, MD  01/27/2024, 10:01 PM

## 2024-01-27 NOTE — ED Triage Notes (Signed)
 Pt to ED via [facility] from home. Per caregiver, pt has hx of AMS and UTI. Per caregiver, pt's mental status has been getting worse for the past five days and he has had stinky urine. EMS called out earlier today due to pt falling, but caregiver did not think pt needed to be transported. EMS called back out this evening as caregiver states pt has been in bed all day.  BP 140/80 91% ra HR 90 Cbg 131 98.7 oral

## 2024-01-27 NOTE — Assessment & Plan Note (Addendum)
 Primary hypertension Soft BP 112/47 Hold home antihypertensives Monitoring for developing sepsis

## 2024-01-27 NOTE — Assessment & Plan Note (Signed)
CPAP intolerant. 

## 2024-01-27 NOTE — Assessment & Plan Note (Addendum)
 Moderate aortic stenosis History of AAA repair Stable conditions-no acute disease suspected Clinically euvolemic Holding BP lowering meds due to soft blood pressures Monitor fluid status is being hydrated

## 2024-01-27 NOTE — Assessment & Plan Note (Signed)
-   Continue home meds

## 2024-01-27 NOTE — Assessment & Plan Note (Addendum)
 Baseline mild cognitive impairment Secondary to UTI Neurologic checks with fall and aspiration precautions N.p.o. until more awake and alert At baseline patient lives alone and has caregivers

## 2024-01-27 NOTE — Assessment & Plan Note (Addendum)
 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

## 2024-01-27 NOTE — Assessment & Plan Note (Signed)
-   Continue Eliquis

## 2024-01-27 NOTE — Assessment & Plan Note (Signed)
 Continue home meds pending verification

## 2024-01-27 NOTE — Assessment & Plan Note (Signed)
 Continue aspirin 

## 2024-01-27 NOTE — Assessment & Plan Note (Signed)
 Not acutely exacerbated DuoNebs as needed

## 2024-01-27 NOTE — H&P (Signed)
 History and Physical    Patient: Bryan Howard. FMW:[id number] DOB: 1932/10/15 DOA: 01/27/2024 DOS: the patient was seen and examined on 01/27/2024 PCP: Jyl Railing, MD  Patient coming from: Home  Chief Complaint:  Chief Complaint  Patient presents with   Altered Mental Status    HPI: Bryan Howard. is a 89 y.o. male with medical history significant for HTN, PUD,TIA(2012), HFpEF, COPD, OSA(not on CPAP, mild cognitive impairment, hx of PE, hypothyroidism, AAA s/p repair, ,  prostate cancer s/p cryoablation (2013),recurrent right epidiymoorchitis  s/p right  orchiectomy(2023), ,. recurrent UTIs most recently E. coli for which he was hospitalized 8/22 to 12/14/2023 at [facility], s/p cystogram 01/02/2024 concerning for enterovesical fistula,, being admitted with a another UTI presenting with altered mental status, how he typically presents.  Patient who lives alone and has caregivers, and at baseline is conversational, has become increasingly lethargic, resulting in a fall without injury 2 days prior.  Urine noted to be malodorous.  No reported fever or chills vomiting, diarrhea, cough or shortness of breath. In the ED tachycardic to 104 intermittently tachypneic somewhat soft blood pressure at 112/47. Labs with normal WBC 9.1 and lactic acid 0.8 and urinalysis strongly consistent with infection.  CMP unremarkable.  CBC otherwise notable for hemoglobin 10.9 and platelets 130.  A VBG was done which was unremarkable, UDS positive for opiates.EKG showed sinus at 91 with nonspecific ST-T wave changes CT head and C-spine nonacute Renal ultrasound pending Patient started on Rocephin  and given a 500 cc NS bolus Admission requested      Past Medical History:  Diagnosis Date   Abdominal aortic aneurysm    Coronary artery disease    Diverticulosis of colon    Esophageal stricture    GERD (gastroesophageal reflux disease)    Hemorrhoids, external    Hiatal hernia    Hyperlipidemia     Hypothyroidism    Past Surgical History:  Procedure Laterality Date   ABDOMINAL AORTIC ANEURYSM REPAIR     APPENDECTOMY     EYE SURGERY     bilateral cataract surgery   INGUINAL HERNIA REPAIR     Right inguinal herniography   NECK SURGERY     fuse vertabrae    ROTATOR CUFF REPAIR     left   TOTAL KNEE ARTHROPLASTY     left    TOTAL KNEE ARTHROPLASTY     right   Social History:  reports that he has quit smoking. He has never used smokeless tobacco. He reports that he does not drink alcohol and does not use drugs.  Allergies  Allergen Reactions   Captopril Cough   Quinolones Other (See Comments)    AAA patient- avoid if possible    Family History  Problem Relation Age of Onset   Colon cancer Cousin        maternal 1st cousin   Heart disease Unknown     Prior to Admission medications   Medication Sig Start Date End Date Taking? Authorizing Provider  acetaminophen (TYLENOL) 650 MG CR tablet Take one tablet every morning and two tablets nightly 08/12/22  Yes [provider]  atorvastatin (LIPITOR) 80 MG tablet Take 80 mg by mouth every morning.    Yes [provider]  Cholecalciferol (VITAMIN D-1000 MAX ST) 1000 units tablet Take 2,000 Units by mouth daily.    Yes [provider]  DULoxetine (CYMBALTA) 30 MG capsule Take 30 mg by mouth daily.    Yes [provider]  levothyroxine (SYNTHROID, LEVOTHROID) 112 MCG tablet Take 112 mcg by mouth daily before breakfast.    Yes [provider]  omeprazole (PRILOSEC) 20 MG capsule Take 20 mg by mouth 2 (two) times daily as needed.    Yes [provider]  Alpha-Lipoic Acid 300 MG CAPS Take 300 mg by mouth daily.     [provider]  ascorbic acid (VITAMIN C) 500 MG tablet Take 1,000 mg by mouth 2 (two) times daily.     [provider]  aspirin EC 81 MG tablet Take 81 mg by mouth daily.     [provider]  cyanocobalamin ([employer] VITAMIN B12) 1000 MCG tablet  Take 1,000 mcg by mouth daily.     [provider]  enoxaparin (LOVENOX) 40 MG/0.4ML injection Inject 40 mg into the skin daily. 12/18/17 12/22/17  [provider]  folic acid (FOLVITE) 1 MG tablet Take 1 mg by mouth daily.    [provider]  gabapentin (NEURONTIN) 300 MG capsule Take 300 mg by mouth daily.    [provider]  gabapentin (NEURONTIN) 600 MG tablet Take 600 mg by mouth at bedtime.     [provider]  HYDROcodone-acetaminophen (NORCO) 7.5-325 MG tablet Take 1 tablet by mouth as directed.    [provider]  oxybutynin (DITROPAN-XL) 10 MG 24 hr tablet Take 10 mg by mouth at bedtime.    [provider]  sennosides-docusate sodium (SENOKOT-S) 8.6-50 MG tablet Take 1 tablet by mouth daily.    [provider]    Physical Exam: Vitals:   01/27/24 1830 01/27/24 1900 01/27/24 1930 01/27/24 2000  BP: 119/86 124/65 119/70 (!) 112/47  Pulse: (!) 104 94 93 90  Resp: (!) 25 14 20  (!) 26  Temp:      TempSrc:      SpO2: 100% 100% 93% 95%   Physical Exam Vitals and nursing note reviewed.  Constitutional:      General: He is not in acute distress.    Comments: Lethargic but arousable  HENT:     Head: Normocephalic and atraumatic.  Cardiovascular:     Rate and Rhythm: Normal rate and regular rhythm.     Heart sounds: Normal heart sounds.  Pulmonary:     Effort: Pulmonary effort is normal.     Breath sounds: Normal breath sounds.  Abdominal:     Palpations: Abdomen is soft.     Tenderness: There is no abdominal tenderness.  Neurological:     Mental Status: Mental status is at baseline.     Labs on Admission: I have personally reviewed following labs and imaging studies  CBC: Recent Labs  Lab 01/27/24 1816  WBC 9.1  NEUTROABS 7.5  HGB 10.9*  HCT 33.4*  MCV 102.1*  PLT 130*   Basic Metabolic Panel: Recent Labs  Lab 01/27/24 1816  NA 136  K 3.8  CL 98  CO2 27  GLUCOSE 111*  BUN 19  CREATININE  0.72  CALCIUM 9.5   GFR: CrCl cannot be calculated (Unknown ideal weight.). Liver Function Tests: Recent Labs  Lab 01/27/24 1816  AST 18  ALT 13  ALKPHOS 73  BILITOT 1.0  PROT 7.5  ALBUMIN 3.7   Recent Labs  Lab 01/27/24 1816  LIPASE 21   No results for input(s): AMMONIA in the last 168 hours. Coagulation Profile: No results for input(s): INR, PROTIME in the last 168 hours. Cardiac Enzymes: No results for input(s): CKTOTAL, CKMB, CKMBINDEX, TROPONINI in the last  168 hours. BNP (last 3 results) No results for input(s): PROBNP in the last 8760 hours. HbA1C: No results for input(s): HGBA1C in the last 72 hours. CBG: No results for input(s): GLUCAP in the last 168 hours. Lipid Profile: No results for input(s): CHOL, HDL, LDLCALC, TRIG, CHOLHDL, LDLDIRECT in the last 72 hours. Thyroid Function Tests: Recent Labs    01/27/24 1816  TSH 0.259*   Anemia Panel: No results for input(s): VITAMINB12, FOLATE, FERRITIN, TIBC, IRON, RETICCTPCT in the last 72 hours. Urine analysis:    Component Value Date/Time   COLORURINE YELLOW (A) 01/27/2024 1848   APPEARANCEUR CLOUDY (A) 01/27/2024 1848   LABSPEC 1.017 01/27/2024 1848   PHURINE 5.0 01/27/2024 1848   GLUCOSEU NEGATIVE 01/27/2024 1848   HGBUR NEGATIVE 01/27/2024 1848   BILIRUBINUR NEGATIVE 01/27/2024 1848   KETONESUR NEGATIVE 01/27/2024 1848   PROTEINUR 30 (A) 01/27/2024 1848   NITRITE POSITIVE (A) 01/27/2024 1848   LEUKOCYTESUR LARGE (A) 01/27/2024 1848    Radiological Exams on Admission: CT Cervical Spine Wo Contrast Result Date: 01/27/2024 EXAM: CT CERVICAL SPINE WITHOUT CONTRAST 01/27/2024 07:31:25 PM TECHNIQUE: CT of the cervical spine was performed without the administration of intravenous contrast. Multiplanar reformatted images are provided for review. Automated exposure control, iterative reconstruction, and/or weight based adjustment of the mA/kV was utilized to  reduce the radiation dose to as low as reasonably achievable. COMPARISON: None available. CLINICAL HISTORY: Fall pain. Pt to ED via [facility] from home. Per caregiver, pt has hx of AMS and UTI. Per caregiver, pt's mental status has been getting worse for the past five days and he has had stinky urine. EMS called out earlier today due to pt falling, but caregiver did not think pt needed to be transported. EMS called back out this evening as caregiver states pt has been in bed all day. BP 140/80; 91% ra; HR 90; Cbg 131; 98.7 oral. 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 Hardware is intact. No acute fracture or traumatic malalignment. DEGENERATIVE CHANGES: No significant degenerative changes. SOFT TISSUES: No prevertebral soft tissue swelling. VASCULATURE: Atherosclerotic calcifications are present in the aortic arch and great vessel origins without focal stenosis or aneurysm. Atherosclerotic calcifications are present at the carotid bifurcations bilaterally. IMPRESSION: 1. No acute abnormality of the cervical spine related to the fall. 2. 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 Electronically signed by: Lonni Necessary MD 01/27/2024 07:40 PM EDT RP Workstation: [id number]   CT Head Wo Contrast Result Date: 01/27/2024 EXAM: CT HEAD WITHOUT CONTRAST 01/27/2024 07:31:25 PM TECHNIQUE: CT of the head was performed without the administration of intravenous contrast. Automated exposure control, iterative reconstruction, and/or weight based adjustment of the mA/kV was utilized to reduce the radiation dose to as low as reasonably achievable. COMPARISON: 10/23/2023 CLINICAL HISTORY: Patient presented to the ED via [facility] from home following a fall. Per caregiver, the patient has a history of AMS and UTI, with mental status worsening over the past 5 days and  stinky urine. EMS was called earlier today due to the fall, and again this evening as the patient has been in bed all day. FINDINGS: BRAIN AND VENTRICLES: No acute hemorrhage. No evidence of acute infarct. No hydrocephalus. No extra-axial collection. No mass effect or midline shift. Moderate atrophy and white matter changes are similar to the prior exam. This most likely reflects the sequelae of chronic microvascular ischemia. Consider atherosclerosis. ORBITS: No acute  abnormality. SINUSES: No acute abnormality. SOFT TISSUES AND SKULL: No acute soft tissue abnormality. No skull fracture. IMPRESSION: 1. No acute intracranial abnormality. 2. Moderate cerebral atrophy and white matter changes, unchanged from prior, likely sequelae of chronic microvascular ischemia. Electronically signed by: Lonni Necessary MD 01/27/2024 07:38 PM EDT RP Workstation: [id number]   Data Reviewed for HPI: Relevant notes from primary care and specialist visits, past discharge summaries as available in EHR, including Care Everywhere. 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 at [facility] Hx of orchiectomy for recurrent epididymorchitis (2023) History of prostate cancer s/p cryoablation (2013) Recent cystogram is suspicious for vesical fistula with adjacent large bowel  Continue Rocephin  started in the ED based on past sensitivity History of multiple UTIs with several different organisms Urology consult for any additional recommendations Addendum: Following admission meeting SIRS criteria with temp 99.5,  pulse 90, respiration 26, BP 112/47 -Will treat with IV fluid bolus -Repeat lactic acid -May consider switching from Rocephin  to cefepime  due to prior GU instrumentation  Hypotension Primary hypertension Soft BP 112/47 Hold home antihypertensives Monitoring for developing sepsis  Acute metabolic encephalopathy Baseline mild cognitive impairment Secondary to UTI Neurologic checks with fall and aspiration precautions N.p.o. until more awake and alert At baseline patient lives alone and has caregivers  Chronic heart failure with preserved ejection fraction (HFpEF) (HCC) Moderate aortic stenosis History of AAA repair Stable conditions-no acute disease suspected Clinically euvolemic Holding BP lowering meds due to soft blood pressures Monitor fluid status is being hydrated  History of spinal surgery No acute issues suspected Surgery was complicated by meningitis and osteomyelitis  Chronic obstructive pulmonary disease (HCC) Not acutely exacerbated.  DuoNebs as needed  Essential hypertension Continue home meds pending verification  History of pulmonary embolism  PE in June 2024 Eliquis recently discontinued by PCP on 01/16/2024  OSA (obstructive sleep apnea) CPAP intolerant  History of TIA (transient ischemic attack) Continue aspirin  Hypothyroidism Continue home meds    DVT prophylaxis: apixaban  Consults: urology  Advance Care Planning:   Code Status: Limited: Do not attempt resuscitation (DNR) -DNR-LIMITED -Do Not Intubate/DNI   Family Communication: son Tanis Burnley  Disposition Plan: Back to previous home environment  Severity of Illness: The appropriate patient status for this patient is OBSERVATION. Observation status is judged to be reasonable and necessary in order to provide the required intensity of service to ensure the patient's safety. The patient's presenting symptoms, physical exam findings, and initial radiographic and laboratory data in the  context of their medical condition is felt to place them at decreased risk for further clinical deterioration. Furthermore, it is anticipated that the patient will be medically stable for discharge from the hospital within 2 midnights of admission.   Author: Delayne LULLA Solian, MD 01/27/2024 9:21 PM  For on call review [email or web address].

## 2024-01-27 NOTE — ED Provider Notes (Signed)
 [facility] Provider Note    Event Date/Time   First MD Initiated Contact with Patient 01/27/24 1803     (approximate)   History   Altered Mental Status   HPI  History is obtained initially from EMS as patient is incapable of contributing  Bryan Howard. is a 89 y.o. male  past medical history of hyperlipidemia, CAD, hypothyroidism, AAA, frequent urinary tract infections and mild dementia who presents from home where he lives with his caregiver for several days of increased confusion and foul-smelling urine.  Patient did have a fall 2 days ago but intervention was not sought at that time.  Patient has a previous history of presenting similarly with prior UTIs.  Confusion was noticed approximately 4 days ago.  Further history is obtained from the patient's son via telephone.  2 months ago patient was seen at [facility] and had a cystoscopy and was cleaned out and has not had a UTI since.  Patient has not had any abdominal pain      Physical Exam   Triage Vital Signs: ED Triage Vitals  Encounter Vitals Group     BP      Girls Systolic BP Percentile      Girls Diastolic BP Percentile      Boys Systolic BP Percentile      Boys Diastolic BP Percentile      Pulse      Resp      Temp      Temp src      SpO2      Weight      Height      Head Circumference      Peak Flow      Pain Score      Pain Loc      Pain Education      Exclude from Growth Chart     Most recent vital signs: Vitals:   01/27/24 1930 01/27/24 2000  BP: 119/70 (!) 112/47  Pulse: 93 90  Resp: 20 (!) 26  Temp:    SpO2: 93% 95%    Nursing Triage Note reviewed. Vital signs reviewed and patients oxygen saturation is normoxic  General: Patient is well nourished, well developed, awake and alert, appears chronically ill Head: Normocephalic and atraumatic Eyes: Normal inspection, extraocular muscles intact, no conjunctival pallor Ear, nose, throat: Normal external exam Neck:  Normal range of motion Respiratory: Patient is in no respiratory distress, lungs CTAB Cardiovascular: Patient is not tachycardic, RR GI: Abd SNT with no guarding or rebound  Back: Normal inspection of the back with good strength and range of motion throughout all ext Extremities: pulses intact with good cap refills, no LE pitting edema or calf tenderness Neuro: The patient is alert and oriented to person, not to place or time, easily confused, follows directions intermittently with strength 5 out of 5 in all extremities sensation appears grossly intact   ED Results / Procedures / Treatments   Labs (all labs ordered are listed, but only abnormal results are displayed) Labs Reviewed  CBC WITH DIFFERENTIAL/PLATELET - Abnormal; Notable for the following components:      Result Value   RBC 3.27 (*)    Hemoglobin 10.9 (*)    HCT 33.4 (*)    MCV 102.1 (*)    Platelets 130 (*)    All other components within normal limits  COMPREHENSIVE METABOLIC PANEL WITH GFR - Abnormal; Notable for the following components:   Glucose, Bld 111 (*)  All other components within normal limits  URINALYSIS, ROUTINE W REFLEX MICROSCOPIC - Abnormal; Notable for the following components:   Color, Urine YELLOW (*)    APPearance CLOUDY (*)    Protein, ur 30 (*)    Nitrite POSITIVE (*)    Leukocytes,Ua LARGE (*)    Bacteria, UA MANY (*)    All other components within normal limits  BLOOD GAS, VENOUS - Abnormal; Notable for the following components:   Bicarbonate 32.4 (*)    Acid-Base Excess 6.6 (*)    All other components within normal limits  TSH - Abnormal; Notable for the following components:   TSH 0.259 (*)    All other components within normal limits  URINE DRUG SCREEN, QUALITATIVE ([facility] ONLY) - Abnormal; Notable for the following components:   Opiate, Ur Screen POSITIVE (*)    All other components within normal limits  CULTURE, BLOOD (ROUTINE X 2)  CULTURE, BLOOD (ROUTINE X 2)  URINE CULTURE   LIPASE, BLOOD  LACTIC ACID, PLASMA  PROCALCITONIN  LACTIC ACID, PLASMA  BRAIN NATRIURETIC PEPTIDE     EKG EKG and rhythm strip are interpreted by myself:   EKG: [Normal sinus rhythm] at heart rate of 91, normal QRS duration, QTc 421, nonspecific ST segments and T waves PVCs EKG not consistent with Acute STEMI Rhythm strip: NSR in lead II   RADIOLOGY CT head: No intracranial hemorrhage on my independent review interpretation radiologist agrees CT C-spine: No acute abnormality Renal ultrasound: Pending    PROCEDURES:  Critical Care performed: No  Procedures   MEDICATIONS ORDERED IN ED: Medications  cefTRIAXone  (ROCEPHIN ) 1 g in sodium chloride  0.9 % 100 mL IVPB (has no administration in time range)  sodium chloride  0.9 % bolus 500 mL (500 mLs Intravenous New Bag/Given 01/27/24 1825)  cefTRIAXone  (ROCEPHIN ) 1 g in sodium chloride  0.9 % 100 mL IVPB (1 g Intravenous New Bag/Given 01/27/24 1900)     IMPRESSION / MDM / ASSESSMENT AND PLAN / ED COURSE                                Differential diagnosis includes, but is not limited to, UTI, sepsis, intracranial hemorrhage, hypercarbia, anemia, electrolyte derangement   ED course: Patient arrives and appears encephalopathic but has no focal neurological deficits.  Luckily his lactic acid is not elevated.  He has no leukocytosis and he has no acute renal insufficiency.  Urinalysis is consistent with UTI.  He had no intracranial hemorrhage.  Case discussed with hospitalist for admission.  He has a thyroid and a renal ultrasound pending   Clinical Course as of 01/27/24 2116  Fri Jan 27, 2024  1841 Blood gas, venous(!) VBG [HD]  1841 WBC: 9.1 No leukocytosis [HD]  1851 Per 10/23/2023 urine culture he has grown out E. coli sensitive to send ceftriaxone  in the past.  Will order such [HD]  1939 Urinalysis, Routine w reflex microscopic -Urine, Catheterized(!) Consistent with UTI [HD]  2013 Patients son Bryan Howard update:   [phone] [HD]  2013 Creatinine: 0.72 No aki [HD]  2030 Case discussed with hospitalist for admission [HD]    Clinical Course User Index [HD] Nicholaus Rolland BRAVO, MD   -- Risk: 5 This patient has a high risk of morbidity due to further diagnostic testing or treatment. Rationale: This patient's evaluation and management involve a high risk of morbidity due to the potential severity of presenting symptoms, need for diagnostic testing, and/or initiation  of treatment that may require close monitoring. The differential includes conditions with potential for significant deterioration or requiring escalation of care. Treatment decisions in the ED, including medication administration, procedural interventions, or disposition planning, reflect this level of risk. COPA: 5 The patient has the following acute or chronic illness/injury that poses a possible threat to life or bodily function: [X] : The patient has a potentially serious acute condition or an acute exacerbation of a chronic illness requiring urgent evaluation and management in the Emergency Department. The clinical presentation necessitates immediate consideration of life-threatening or function-threatening diagnoses, even if they are ultimately ruled out.   FINAL CLINICAL IMPRESSION(S) / ED DIAGNOSES   Final diagnoses:  Acute encephalopathy  Urinary tract infection without hematuria, site unspecified     Rx / DC Orders   ED Discharge Orders     None        Note:  This document was prepared using Dragon voice recognition software and may include unintentional dictation errors.   Nicholaus Rolland BRAVO, MD 01/27/24 2116

## 2024-01-27 NOTE — ED Notes (Signed)
 Called pt son to give update. He states pt was seen at [facility] two weeks ago for a cystography and pt's bladder was cleaned out due to chronic UTI's. Son is confused as to why his bladder keeps building sediment. He would like a call at the provider's earliest convenience. He will be by tomorrow to visit.

## 2024-01-27 NOTE — Assessment & Plan Note (Signed)
 No acute issues suspected Surgery was complicated by meningitis and osteomyelitis

## 2024-01-28 DIAGNOSIS — R531 Weakness: Secondary | ICD-10-CM | POA: Diagnosis present

## 2024-01-28 DIAGNOSIS — B961 Klebsiella pneumoniae [K. pneumoniae] as the cause of diseases classified elsewhere: Secondary | ICD-10-CM | POA: Diagnosis present

## 2024-01-28 DIAGNOSIS — Z87891 Personal history of nicotine dependence: Secondary | ICD-10-CM | POA: Diagnosis not present

## 2024-01-28 DIAGNOSIS — D696 Thrombocytopenia, unspecified: Secondary | ICD-10-CM | POA: Diagnosis present

## 2024-01-28 DIAGNOSIS — G9341 Metabolic encephalopathy: Secondary | ICD-10-CM | POA: Diagnosis present

## 2024-01-28 DIAGNOSIS — E785 Hyperlipidemia, unspecified: Secondary | ICD-10-CM | POA: Diagnosis present

## 2024-01-28 DIAGNOSIS — Z86711 Personal history of pulmonary embolism: Secondary | ICD-10-CM | POA: Diagnosis not present

## 2024-01-28 DIAGNOSIS — Z9181 History of falling: Secondary | ICD-10-CM | POA: Diagnosis not present

## 2024-01-28 DIAGNOSIS — E039 Hypothyroidism, unspecified: Secondary | ICD-10-CM | POA: Diagnosis present

## 2024-01-28 DIAGNOSIS — I2510 Atherosclerotic heart disease of native coronary artery without angina pectoris: Secondary | ICD-10-CM | POA: Diagnosis present

## 2024-01-28 DIAGNOSIS — Z7982 Long term (current) use of aspirin: Secondary | ICD-10-CM | POA: Diagnosis not present

## 2024-01-28 DIAGNOSIS — Z1624 Resistance to multiple antibiotics: Secondary | ICD-10-CM | POA: Diagnosis present

## 2024-01-28 DIAGNOSIS — Z8679 Personal history of other diseases of the circulatory system: Secondary | ICD-10-CM | POA: Diagnosis not present

## 2024-01-28 DIAGNOSIS — N390 Urinary tract infection, site not specified: Secondary | ICD-10-CM | POA: Diagnosis present

## 2024-01-28 DIAGNOSIS — N321 Vesicointestinal fistula: Secondary | ICD-10-CM | POA: Diagnosis present

## 2024-01-28 DIAGNOSIS — I5032 Chronic diastolic (congestive) heart failure: Secondary | ICD-10-CM | POA: Diagnosis present

## 2024-01-28 DIAGNOSIS — Z66 Do not resuscitate: Secondary | ICD-10-CM | POA: Diagnosis present

## 2024-01-28 DIAGNOSIS — R6510 Systemic inflammatory response syndrome (SIRS) of non-infectious origin without acute organ dysfunction: Secondary | ICD-10-CM | POA: Diagnosis present

## 2024-01-28 DIAGNOSIS — G4733 Obstructive sleep apnea (adult) (pediatric): Secondary | ICD-10-CM | POA: Diagnosis present

## 2024-01-28 DIAGNOSIS — F03A0 Unspecified dementia, mild, without behavioral disturbance, psychotic disturbance, mood disturbance, and anxiety: Secondary | ICD-10-CM | POA: Diagnosis present

## 2024-01-28 DIAGNOSIS — Z79890 Hormone replacement therapy (postmenopausal): Secondary | ICD-10-CM | POA: Diagnosis not present

## 2024-01-28 DIAGNOSIS — I110 Hypertensive heart disease with heart failure: Secondary | ICD-10-CM | POA: Diagnosis present

## 2024-01-28 DIAGNOSIS — J449 Chronic obstructive pulmonary disease, unspecified: Secondary | ICD-10-CM | POA: Diagnosis present

## 2024-01-28 DIAGNOSIS — I350 Nonrheumatic aortic (valve) stenosis: Secondary | ICD-10-CM | POA: Diagnosis present

## 2024-01-28 LAB — BLOOD CULTURE ID PANEL (REFLEXED) - BCID2

## 2024-01-28 LAB — BLOOD GAS, VENOUS
Acid-Base Excess: 6.6 mmol/L — ABNORMAL HIGH (ref 0.0–2.0)
Bicarbonate: 32.4 mmol/L — ABNORMAL HIGH (ref 20.0–28.0)
O2 Saturation: 31.7 %
Patient temperature: 37
pCO2, Ven: 50 mmHg (ref 44–60)
pH, Ven: 7.42 (ref 7.25–7.43)

## 2024-01-28 LAB — CBC
HCT: 30.9 % — ABNORMAL LOW (ref 39.0–52.0)
Hemoglobin: 10.1 g/dL — ABNORMAL LOW (ref 13.0–17.0)
MCH: 33.2 pg (ref 26.0–34.0)
MCHC: 32.7 g/dL (ref 30.0–36.0)
MCV: 101.6 fL — ABNORMAL HIGH (ref 80.0–100.0)
Platelets: 115 K/uL — ABNORMAL LOW (ref 150–400)
RBC: 3.04 MIL/uL — ABNORMAL LOW (ref 4.22–5.81)
RDW: 14 % (ref 11.5–15.5)
WBC: 7.8 K/uL (ref 4.0–10.5)
nRBC: 0 % (ref 0.0–0.2)

## 2024-01-28 LAB — BASIC METABOLIC PANEL WITH GFR
Anion gap: 8 (ref 5–15)
BUN: 18 mg/dL (ref 8–23)
CO2: 27 mmol/L (ref 22–32)
Calcium: 9 mg/dL (ref 8.9–10.3)
Chloride: 104 mmol/L (ref 98–111)
Creatinine, Ser: 0.84 mg/dL (ref 0.61–1.24)
GFR, Estimated: >60 mL/min (ref >60)
Glucose, Bld: 103 mg/dL — ABNORMAL HIGH (ref 70–99)
Potassium: 3.7 mmol/L (ref 3.5–5.1)
Sodium: 139 mmol/L (ref 135–145)

## 2024-01-28 LAB — LACTIC ACID, PLASMA
Lactic Acid, Venous: 1.2 mmol/L (ref 0.5–1.9)
Lactic Acid, Venous: 0.8 mmol/L (ref 0.5–1.9)

## 2024-01-28 LAB — BRAIN NATRIURETIC PEPTIDE: B Natriuretic Peptide: 144.1 pg/mL — ABNORMAL HIGH (ref 0.0–100.0)

## 2024-01-28 NOTE — Plan of Care (Signed)

## 2024-01-28 NOTE — Progress Notes (Signed)
 Patient admitted from the ED,pt is alert and oriented x 2,breathing freely on room air.Vital signs done and recorded.Safety measures maintained

## 2024-01-28 NOTE — Plan of Care (Signed)

## 2024-01-28 NOTE — Progress Notes (Addendum)
 Progress Note    Bryan Howard.  FMW:[id number] DOB: 1932-09-02  DOA: 01/27/2024 PCP: Jyl Railing, MD      Brief Narrative:    Medical records reviewed and are as summarized below:  Bryan Howard. is a 89 y.o. male  with medical history significant for HTN, PUD,TIA(2012), HFpEF, COPD, OSA(not on CPAP, mild cognitive impairment, hx of PE, hypothyroidism, AAA s/p repair, ,  prostate cancer s/p cryoablation (2013),recurrent right epidiymoorchitis  s/p right  orchiectomy(2023), ,. recurrent UTIs most recently E. 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  He presented to the hospital with altered mental status/lethargy.  He lives at home alone and has private caregivers.  He had become increasingly lethargic and subsequently had a fall without injury about 2 days prior to admission.  His urine was noted to be malodorous.  Reportedly, he normally has change in mental status when he has a UTI.   He was admitted to the hospital for acute complicated UTI.   Assessment/Plan:   Principal Problem:   Complicated urinary tract infection/ recurrent UTI Active Problems:   Acute metabolic encephalopathy   Hypotension   Chronic heart failure with preserved ejection fraction (HFpEF) (HCC)   Hypothyroidism   History of TIA (transient ischemic attack)   Moderate aortic stenosis   OSA (obstructive sleep apnea)   History of pulmonary embolism   Essential hypertension   Chronic obstructive pulmonary disease (HCC)   History of orchiectomy   History of spinal surgery   Urinary tract infection    Body mass index is 26.45 kg/m.   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  Follow-up urine cultures.   Acute metabolic encephalopathy: Improved   Chronic heart failure with preserved EF, moderate  aortic stenosis, hypertension, history of AAA repair: Compensated   Thrombocytopenia: Platelet count is trending downward.  Repeat CBC tomorrow.   COPD: Stable   General weakness: PT and OT evaluation   History of pulmonary embolism in June 2024 (Eliquis was recently discontinued by PCP 01/16/2024)   Comorbidities include history of orchiectomy for recurrent epididymoorchitis in 2023, history of prostate cancer s/p cryoablation in 2013, history of spinal surgery, chronic neck, back and shoulder pain, OSA intolerant of CPAP, hypothyroidism, history of TIA    Diet Order             Diet Heart Room service appropriate? Yes; Fluid consistency: Thin  Diet effective now                                  Consultants: None  Procedures: None    Medications:    aspirin EC  81 mg Oral Daily   atorvastatin  80 mg Oral Daily   enoxaparin (LOVENOX) injection  40 mg Subcutaneous Q24H   levothyroxine  112 mcg Oral Q0600   Continuous Infusions:  cefTRIAXone  (ROCEPHIN )  IV       Anti-infectives (From admission, onward)    Start     Dose/Rate Route Frequency Ordered Stop   01/28/24 1800  cefTRIAXone  (ROCEPHIN ) 1 g in sodium chloride  0.9 % 100 mL IVPB        1 g 200 mL/hr over 30 Minutes Intravenous Every 24 hours 01/27/24 2100     01/27/24 1900  cefTRIAXone  (ROCEPHIN ) 1 g  in sodium chloride  0.9 % 100 mL IVPB        1 g 200 mL/hr over 30 Minutes Intravenous  Once 01/27/24 1852 01/27/24 2220              Family Communication/Anticipated D/C date and plan/Code Status   DVT prophylaxis: enoxaparin (LOVENOX) injection 40 mg Start: 01/27/24 2200     Code Status: Limited: Do not attempt resuscitation (DNR) -DNR-LIMITED -Do Not Intubate/DNI   Family Communication: Plan discussed with [name], son, at the bedside Disposition Plan: Plan to discharge home   Status is: Inpatient Remains inpatient appropriate because: Acute UTI       Subjective:    Interval events noted.  He complains of general weakness.  He also has chronic neck and upper back and bilateral shoulder pain.  [name], son, was at the bedside  Objective:    Vitals:   01/27/24 2239 01/27/24 2243 01/28/24 0407 01/28/24 0705  BP: (!) 189/130 124/61 109/66   Pulse: 87 80 80   Resp: 16  18   Temp: 99.2 F (37.3 C)  99.2 F (37.3 C)   TempSrc:      SpO2: 94%  92%   Weight:    74.3 kg  Height:    5' 5.98 (1.676 m)   No data found.   Intake/Output Summary (Last 24 hours) at 01/28/2024 1120 Last data filed at 01/28/2024 9367 Gross per 24 hour  Intake 588.29 ml  Output 250 ml  Net 338.29 ml   Filed Weights   01/27/24 2222 01/28/24 0705  Weight: 74.3 kg 74.3 kg    Exam:  GEN: NAD SKIN: Warm and dry EYES: No pallor or icterus ENT: MMM CV: RRR PULM: CTA B ABD: soft, ND, NT, +BS CNS: AAO x 3, non focal EXT: No edema or tenderness        Data Reviewed:   I have personally reviewed following labs and imaging studies:  Labs: Labs show the following:   Basic Metabolic Panel: Recent Labs  Lab 01/27/24 1816 01/28/24 0450  NA 136 139  K 3.8 3.7  CL 98 104  CO2 27 27  GLUCOSE 111* 103*  BUN 19 18  CREATININE 0.72 0.84  CALCIUM 9.5 9.0   GFR Estimated Creatinine Clearance: 53.8 mL/min (by C-G formula based on SCr of 0.84 mg/dL). Liver Function Tests: Recent Labs  Lab 01/27/24 1816  AST 18  ALT 13  ALKPHOS 73  BILITOT 1.0  PROT 7.5  ALBUMIN 3.7   Recent Labs  Lab 01/27/24 1816  LIPASE 21   No results for input(s): AMMONIA in the last 168 hours. Coagulation profile No results for input(s): INR, PROTIME in the last 168 hours.  CBC: Recent Labs  Lab 01/27/24 1816 01/28/24 0450  WBC 9.1 7.8  NEUTROABS 7.5  --   HGB 10.9* 10.1*  HCT 33.4* 30.9*  MCV 102.1* 101.6*  PLT 130* 115*   Cardiac Enzymes: No results for input(s): CKTOTAL, CKMB, CKMBINDEX, TROPONINI in the last 168 hours. BNP (last 3  results) No results for input(s): PROBNP in the last 8760 hours. CBG: No results for input(s): GLUCAP in the last 168 hours. D-Dimer: No results for input(s): DDIMER in the last 72 hours. Hgb A1c: No results for input(s): HGBA1C in the last 72 hours. Lipid Profile: No results for input(s): CHOL, HDL, LDLCALC, TRIG, CHOLHDL, LDLDIRECT in the last 72 hours. Thyroid function studies: Recent Labs    01/27/24 1816  TSH 0.259*   Anemia work up:  No results for input(s): VITAMINB12, FOLATE, FERRITIN, TIBC, IRON, RETICCTPCT in the last 72 hours. Sepsis Labs: Recent Labs  Lab 01/27/24 1816 01/27/24 2324 01/28/24 0450  PROCALCITON <0.10  --   --   WBC 9.1  --  7.8  LATICACIDVEN 0.8 1.2 0.8    Microbiology Recent Results (from the past 240 hours)  Blood culture (routine x 2)     Status: None (Preliminary result)   Collection Time: 01/27/24  6:16 PM   Specimen: BLOOD  Result Value Ref Range Status   Specimen Description BLOOD BLOOD RIGHT FOREARM  Final   Special Requests   Final    BOTTLES DRAWN AEROBIC AND ANAEROBIC Blood Culture adequate volume   Culture   Final    NO GROWTH < 12 HOURS Performed at [facility], [address] [place], [place] [address]    Report Status PENDING  Incomplete  Blood culture (routine x 2)     Status: None (Preliminary result)   Collection Time: 01/27/24 11:24 PM   Specimen: Left Antecubital; Blood  Result Value Ref Range Status   Specimen Description LEFT ANTECUBITAL  Final   Special Requests   Final    BOTTLES DRAWN AEROBIC AND ANAEROBIC Blood Culture results may not be optimal due to an inadequate volume of blood received in culture bottles   Culture   Final    NO GROWTH < 12 HOURS Performed at [facility], [address] [place], [place] [address]    Report Status PENDING  Incomplete    Procedures and diagnostic studies:  US  Renal Result Date: 01/27/2024 CLINICAL DATA:  s/p recent  cystography, lots of a sediment in urine. EXAM: RENAL / URINARY TRACT ULTRASOUND COMPLETE COMPARISON:  01/14/2022 FINDINGS: Right Kidney: Renal measurements: 10.2 x 5.1 x 5.4 cm = volume: 147 mL. 1.3 cm upper pole cyst. Normal echotexture. No hydronephrosis. Left Kidney: Renal measurements: 10.7 x 5.7 x 5.4 cm = volume: 171 mL. Shadowing foci in the left lower pole may reflect nonobstructing stones measuring up to 8 mm. Normal echotexture. No hydronephrosis. Bladder: Mild bladder wall thickening and irregularity. Other: None. IMPRESSION: No acute findings.  No hydronephrosis. Probable nonobstructing left lower pole nephrolithiasis. Mild bladder wall thickening and irregularity. This could be related to cystitis or chronic bladder outlet obstruction. Electronically Signed   By: Franky Crease M.D.   On: 01/27/2024 22:08   CT Cervical Spine Wo Contrast Result Date: 01/27/2024 EXAM: CT CERVICAL SPINE WITHOUT CONTRAST 01/27/2024 07:31:25 PM TECHNIQUE: CT of the cervical spine was performed without the administration of intravenous contrast. Multiplanar reformatted images are provided for review. Automated exposure control, iterative reconstruction, and/or weight based adjustment of the mA/kV was utilized to reduce the radiation dose to as low as reasonably achievable. COMPARISON: None available. CLINICAL HISTORY: Fall pain. Pt to ED via [facility] from home. Per caregiver, pt has hx of AMS and UTI. Per caregiver, pt's mental status has been getting worse for the past five days and he has had stinky urine. EMS called out earlier today due to pt falling, but caregiver did not think pt needed to be transported. EMS called back out this evening as caregiver states pt has been in bed all day. BP 140/80; 91% ra; HR 90; Cbg 131; 98.7 oral. 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 Hardware is intact. No acute fracture  or traumatic malalignment. DEGENERATIVE CHANGES: No significant degenerative changes. SOFT TISSUES:  No prevertebral soft tissue swelling. VASCULATURE: Atherosclerotic calcifications are present in the aortic arch and great vessel origins without focal stenosis or aneurysm. Atherosclerotic calcifications are present at the carotid bifurcations bilaterally. IMPRESSION: 1. No acute abnormality of the cervical spine related to the fall. 2. 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 Electronically signed by: Lonni Necessary MD 01/27/2024 07:40 PM EDT RP Workstation: [id number]   CT Head Wo Contrast Result Date: 01/27/2024 EXAM: CT HEAD WITHOUT CONTRAST 01/27/2024 07:31:25 PM TECHNIQUE: CT of the head was performed without the administration of intravenous contrast. Automated exposure control, iterative reconstruction, and/or weight based adjustment of the mA/kV was utilized to reduce the radiation dose to as low as reasonably achievable. COMPARISON: 10/23/2023 CLINICAL HISTORY: Patient presented to the ED via [facility] from home following a fall. Per caregiver, the patient has a history of AMS and UTI, with mental status worsening over the past 5 days and stinky urine. EMS was called earlier today due to the fall, and again this evening as the patient has been in bed all day. FINDINGS: BRAIN AND VENTRICLES: No acute hemorrhage. No evidence of acute infarct. No hydrocephalus. No extra-axial collection. No mass effect or midline shift. Moderate atrophy and white matter changes are similar to the prior exam. This most likely reflects the sequelae of chronic microvascular ischemia. Consider atherosclerosis. ORBITS: No acute abnormality. SINUSES: No acute abnormality. SOFT TISSUES AND SKULL: No acute soft tissue abnormality. No skull fracture. IMPRESSION: 1. No acute intracranial abnormality. 2. Moderate cerebral atrophy and white matter changes, unchanged from prior, likely  sequelae of chronic microvascular ischemia. Electronically signed by: Lonni Necessary MD 01/27/2024 07:38 PM EDT RP Workstation: [id number]               LOS: 0 days   Breslin Burklow  [facility]   Pager on [email or web address]. If 7PM-7AM, please contact night-coverage at [email or web address]     01/28/2024, 11:20 AM

## 2024-01-28 NOTE — Plan of Care (Signed)
  Problem: Fluid Volume: Goal: Hemodynamic stability will improve Outcome: Progressing   Problem: Fluid Volume: Goal: Hemodynamic stability will improve Outcome: Progressing   Problem: Clinical Measurements: Goal: Diagnostic test results will improve Outcome: Progressing Goal: Signs and symptoms of infection will decrease Outcome: Progressing   Problem: Respiratory: Goal: Ability to maintain adequate ventilation will improve Outcome: Progressing   Problem: Education: Goal: Knowledge of General Education information will improve Description: Including pain rating scale, medication(s)/side effects and non-pharmacologic comfort measures Outcome: Progressing

## 2024-01-28 NOTE — Progress Notes (Signed)
 PHARMACY - PHYSICIAN COMMUNICATION CRITICAL VALUE ALERT - BLOOD CULTURE IDENTIFICATION (BCID)  Bryan Howard. is an 89 y.o. male who presented to [facility] on 01/27/2024 with a chief complaint of enterovesical fistula, UTI and AMS  Assessment:  1/4 aerobic bottles from blood culture growing staph species (further speciation undetermined). Per Rapid ID Algorithm, Vancomycin recommended.  Name of physician (or Provider) Contacted: Cleatus Hoof  Current antibiotics: Rocephin  1G    Changes to prescribed antibiotics recommended:  Recommendations declined by provider. MD recommended to not change antibiotics at this time.   Results for orders placed or performed during the hospital encounter of 01/27/24  Blood Culture ID Panel (Reflexed) (Collected: 01/27/2024 11:24 PM)  Result Value Ref Range   Enterococcus faecalis NOT DETECTED NOT DETECTED   Enterococcus Faecium NOT DETECTED NOT DETECTED   Listeria monocytogenes NOT DETECTED NOT DETECTED   Staphylococcus species DETECTED (A) NOT DETECTED   Staphylococcus aureus (BCID) NOT DETECTED NOT DETECTED   Staphylococcus epidermidis NOT DETECTED NOT DETECTED   Staphylococcus lugdunensis NOT DETECTED NOT DETECTED   Streptococcus species NOT DETECTED NOT DETECTED   Streptococcus agalactiae NOT DETECTED NOT DETECTED   Streptococcus pneumoniae NOT DETECTED NOT DETECTED   Streptococcus pyogenes NOT DETECTED NOT DETECTED   A.calcoaceticus-baumannii NOT DETECTED NOT DETECTED   Bacteroides fragilis NOT DETECTED NOT DETECTED   Enterobacterales NOT DETECTED NOT DETECTED   Enterobacter cloacae complex NOT DETECTED NOT DETECTED   Escherichia coli NOT DETECTED NOT DETECTED   Klebsiella aerogenes NOT DETECTED NOT DETECTED   Klebsiella oxytoca NOT DETECTED NOT DETECTED   Klebsiella pneumoniae NOT DETECTED NOT DETECTED   Proteus species NOT DETECTED NOT DETECTED   Salmonella species NOT DETECTED NOT DETECTED   Serratia marcescens NOT DETECTED NOT  DETECTED   Haemophilus influenzae NOT DETECTED NOT DETECTED   Neisseria meningitidis NOT DETECTED NOT DETECTED   Pseudomonas aeruginosa NOT DETECTED NOT DETECTED   Stenotrophomonas maltophilia NOT DETECTED NOT DETECTED   Candida albicans NOT DETECTED NOT DETECTED   Candida auris NOT DETECTED NOT DETECTED   Candida glabrata NOT DETECTED NOT DETECTED   Candida krusei NOT DETECTED NOT DETECTED   Candida parapsilosis NOT DETECTED NOT DETECTED   Candida tropicalis NOT DETECTED NOT DETECTED   Cryptococcus neoformans/gattii NOT DETECTED NOT DETECTED    Annabella LOISE Banks 01/28/2024  9:46 PM

## 2024-01-29 DIAGNOSIS — N390 Urinary tract infection, site not specified: Secondary | ICD-10-CM | POA: Diagnosis not present

## 2024-01-29 LAB — CBC
HCT: 29.8 % — ABNORMAL LOW (ref 39.0–52.0)
Hemoglobin: 9.8 g/dL — ABNORMAL LOW (ref 13.0–17.0)
MCH: 33.3 pg (ref 26.0–34.0)
MCHC: 32.9 g/dL (ref 30.0–36.0)
MCV: 101.4 fL — ABNORMAL HIGH (ref 80.0–100.0)
Platelets: 117 K/uL — ABNORMAL LOW (ref 150–400)
RBC: 2.94 MIL/uL — ABNORMAL LOW (ref 4.22–5.81)
RDW: 13.9 % (ref 11.5–15.5)
WBC: 5.8 K/uL (ref 4.0–10.5)
nRBC: 0 % (ref 0.0–0.2)

## 2024-01-29 NOTE — Progress Notes (Signed)
 PT Cancellation Note  Patient Details Name: Bryan Howard. MRN: [id number] DOB: March 20, 1933   Cancelled Treatment:    Reason Eval/Treat Not Completed: Other (comment) Pt not cooperative during initial evaluation questioning. Pt unresponsive to questions or remarks by PT. Pt son in room mentioned that it's one of the side effects of the UTI. Pt son mentioned that he usually doesn't have any mobility problems although sometimes seeing him perform functional task it can be tough to watch. Pt caregiver mentioned that pt. Might be agreeable to PT at later time.    Menno Vanbergen A Trigger Frasier 01/29/2024, 11:11 AM

## 2024-01-29 NOTE — Evaluation (Signed)
 Physical Therapy Evaluation Patient Details Name: Bryan Howard. MRN: [id number] DOB: Apr 01, 1933 Today's Date: 01/29/2024  History of Present Illness  Bryan Howard. is a 89 y.o. male with medical history significant for HTN, PUD,TIA(2012), HFpEF, COPD, OSA(not on CPAP, mild cognitive impairment, hx of PE, hypothyroidism, AAA s/p repair, ,  prostate cancer s/p cryoablation (2013),recurrent right epidiymoorchitis  s/p right  orchiectomy(2023), ,. recurrent UTIs most recently E. 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  Clinical Impression  Patient seen for initial PT evaluation due to decline in functional status. Patient with limited communication  throughout treatment but follows single step commands routinely throughout treatment. Baseline mobility reported as minA for transfers and ambulation. Son in room mentioned that sometimes he can get around on his own with a RW, currently requiring maxA to sit EOB and transfer to chair. Pt. limited by cognitive impairment and confusion. Pt lives alone and son in room states that he has a caregiver 24/7 during the week and he visits him during the weekend.  Clinical impression: patient presents with significant mobility limitations secondary to acute symptoms from UTI. Recommend skilled PT to address safety, mobility, and discharge planning. PT recommending SNF at d/c at the time. With continue improvement recommendation can change.          If plan is discharge home, recommend the following: Two people to help with walking and/or transfers;Two people to help with bathing/dressing/bathroom   Can travel by private vehicle   No    Equipment Recommendations None recommended by PT  Recommendations for Other Services       Functional Status Assessment Patient has  had a recent decline in their functional status and/or demonstrates limited ability to make significant improvements in function in a reasonable and predictable amount of time     Precautions / Restrictions Precautions Precautions: Fall Restrictions Weight Bearing Restrictions Per Provider Order: No      Mobility  Bed Mobility Overal bed mobility: Needs Assistance Bed Mobility: Rolling, Sidelying to Sit Rolling: Max assist Sidelying to sit: Max assist       General bed mobility comments: physical assistance to support trunk when sitting EOB; able to progress to minA sitting EOB    Transfers Overall transfer level: Needs assistance Equipment used: 1 person hand held assist Transfers: Sit to/from Stand, Bed to chair/wheelchair/BSC Sit to Stand: Max assist Stand pivot transfers: Max assist         General transfer comment: slow to move but will aid in movement when given time    Ambulation/Gait Ambulation/Gait assistance: Max assist Gait Distance (Feet): 2 Feet Assistive device: 1 person hand held assist Gait Pattern/deviations: Trunk flexed, Step-to pattern, Narrow base of support       General Gait Details: few to steps to help positoning to chair  Stairs            Wheelchair Mobility     Tilt Bed    Modified Rankin (Stroke Patients Only)       Balance Overall balance assessment: Needs assistance Sitting-balance support: Bilateral upper extremity supported, Feet supported Sitting balance-Leahy Scale: Poor   Postural control: Right lateral lean Standing balance support: During functional activity Standing balance-Leahy Scale: Zero  Pertinent Vitals/Pain Pain Assessment Pain Assessment: No/denies pain    Home Living Family/patient expects to be discharged to:: Private residence Living Arrangements: Alone Available Help at Discharge: Personal care attendant;Available PRN/intermittently Type of Home:  Apartment         Home Layout: One level Home Equipment: Agricultural consultant (2 wheels);Shower seat - built in;Grab bars - tub/shower;Grab bars - toilet      Prior Function Prior Level of Function : Needs assist       Physical Assist : Mobility (physical) Mobility (physical): Transfers;Bed mobility;Gait;Stairs           Extremity/Trunk Assessment        Lower Extremity Assessment Lower Extremity Assessment: Generalized weakness    Cervical / Trunk Assessment Cervical / Trunk Assessment: Normal  Communication   Communication Communication: Impaired Factors Affecting Communication: Hearing impaired    Cognition Arousal: Obtunded Behavior During Therapy: Flat affect, Restless   PT - Cognitive impairments: History of cognitive impairments, Difficult to assess Difficult to assess due to: Hard of hearing/deaf, Impaired communication                       Following commands: Impaired Following commands impaired: Follows one step commands inconsistently     Cueing Cueing Techniques: Verbal cues, Tactile cues, Visual cues     General Comments      Exercises     Assessment/Plan    PT Assessment Patient needs continued PT services  PT Problem List Decreased strength;Decreased activity tolerance;Decreased balance;Decreased mobility       PT Treatment Interventions DME instruction;Gait training;Functional mobility training;Therapeutic activities;Therapeutic exercise;Balance training;Neuromuscular re-education;Patient/family education    PT Goals (Current goals can be found in the Care Plan section)  Acute Rehab PT Goals PT Goal Formulation: Patient unable to participate in goal setting Time For Goal Achievement: 02/12/24 Potential to Achieve Goals: Fair    Frequency Min 2X/week     Co-evaluation               AM-PAC PT 6 Clicks Mobility  Outcome Measure Help needed turning from your back to your side while in a flat bed without using  bedrails?: A Lot Help needed moving from lying on your back to sitting on the side of a flat bed without using bedrails?: A Lot Help needed moving to and from a bed to a chair (including a wheelchair)?: A Lot Help needed standing up from a chair using your arms (e.g., wheelchair or bedside chair)?: A Lot Help needed to walk in hospital room?: Total Help needed climbing 3-5 steps with a railing? : Total 6 Click Score: 10    End of Session Equipment Utilized During Treatment: Gait belt Activity Tolerance: Other (comment) (limited by cognitive obtundentness and difficulty to arrouse) Patient left: in chair;with chair alarm set;with family/visitor present Nurse Communication: Mobility status PT Visit Diagnosis: Unsteadiness on feet (R26.81);Muscle weakness (generalized) (M62.81)    Time: 8669-8640 PT Time Calculation (min) (ACUTE ONLY): 29 min   Charges:   PT Evaluation $PT Eval Moderate Complexity: 1 Mod   PT General Charges $$ ACUTE PT VISIT: 1 Visit         Sherlean Lesches DPT, PT    Xerxes Agrusa A Jesika Men 01/29/2024, 2:13 PM

## 2024-01-29 NOTE — Progress Notes (Signed)
 Progress Note    Bryan Howard.  FMW:[id number] DOB: Aug 07, 1932  DOA: 01/27/2024 PCP: Jyl Railing, MD      Brief Narrative:    Medical records reviewed and are as summarized below:  Bryan Howard. is a 89 y.o. male  with medical history significant for HTN, PUD,TIA(2012), HFpEF, COPD, OSA(not on CPAP, mild cognitive impairment, hx of PE, hypothyroidism, AAA s/p repair, ,  prostate cancer s/p cryoablation (2013),recurrent right epidiymoorchitis  s/p right  orchiectomy(2023), ,. recurrent UTIs most recently E. 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  He presented to the hospital with altered mental status/lethargy.  He lives at home alone and has private caregivers.  He had become increasingly lethargic and subsequently had a fall without injury about 2 days prior to admission.  His urine was noted to be malodorous.  Reportedly, he normally has change in mental status when he has a UTI.   He was admitted to the hospital for acute complicated UTI.   Assessment/Plan:   Principal Problem:   Complicated urinary tract infection/ recurrent UTI Active Problems:   Acute metabolic encephalopathy   Hypotension   Chronic heart failure with preserved ejection fraction (HFpEF) (HCC)   Hypothyroidism   History of TIA (transient ischemic attack)   Moderate aortic stenosis   OSA (obstructive sleep apnea)   History of pulmonary embolism   Essential hypertension   Chronic obstructive pulmonary disease (HCC)   History of orchiectomy   History of spinal surgery   Urinary tract infection    Body mass index is 26.45 kg/m.   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 coli.  Follow-up sensitivity report. 1 out of 4 blood culture bottles grew out staph species which is likely a contaminant.   Continue IV ceftriaxone .     Acute metabolic encephalopathy now with delirium: Patient is more confused today.  Continue supportive care   Chronic heart failure with preserved EF, moderate aortic stenosis, hypertension, history of AAA repair: Compensated   Thrombocytopenia:  Platelet count is stable.  COPD: Stable   General weakness: PT recommended discharge to SNF.   History of pulmonary embolism in June 2024 (Eliquis was recently discontinued by PCP 01/16/2024)   Comorbidities include history of orchiectomy for recurrent epididymoorchitis in 2023, history of prostate cancer s/p cryoablation in 2013, history of spinal surgery, chronic neck, back and shoulder pain, OSA intolerant of CPAP, hypothyroidism, history of TIA    Diet Order             Diet Heart Room service appropriate? Yes; Fluid consistency: Thin  Diet effective now                                  Consultants: None  Procedures: None    Medications:    aspirin EC  81 mg Oral Daily   atorvastatin  80 mg Oral Daily   enoxaparin (LOVENOX) injection  40 mg Subcutaneous Q24H   levothyroxine  112 mcg Oral Q0600   Continuous Infusions:  cefTRIAXone  (ROCEPHIN )  IV 1 g (01/28/24 1636)     Anti-infectives (From admission, onward)    Start     Dose/Rate Route Frequency Ordered Stop   01/28/24 1800  cefTRIAXone  (ROCEPHIN ) 1 g in sodium chloride  0.9 % 100  mL IVPB        1 g 200 mL/hr over 30 Minutes Intravenous Every 24 hours 01/27/24 2100     01/27/24 1900  cefTRIAXone  (ROCEPHIN ) 1 g in sodium chloride  0.9 % 100 mL IVPB        1 g 200 mL/hr over 30 Minutes Intravenous  Once 01/27/24 1852 01/27/24 2220              Family Communication/Anticipated D/C date and plan/Code Status   DVT prophylaxis: enoxaparin (LOVENOX) injection 40 mg Start: 01/27/24 2200     Code Status: Limited: Do not attempt resuscitation (DNR) -DNR-LIMITED -Do Not Intubate/DNI   Family Communication:  Plan discussed with Bryan Howard, son, at the bedside Disposition Plan: Plan to discharge home   Status is: Inpatient Remains inpatient appropriate because: Acute UTI       Subjective:   Interval events noted.  He is more confused today and unable to provide any history.  Bryan Howard, son, was at the bedside.  Objective:    Vitals:   01/28/24 1952 01/29/24 0406 01/29/24 0748 01/29/24 1020  BP: (!) 101/55 125/67 123/76 (!) 116/53  Pulse: 79 80 68 83  Resp: 18 16 18    Temp: 98.9 F (37.2 C) 98.9 F (37.2 C) 98.4 F (36.9 C)   TempSrc:      SpO2: 94% 94% 95% 91%  Weight:      Height:       No data found.   Intake/Output Summary (Last 24 hours) at 01/29/2024 1504 Last data filed at 01/29/2024 0507 Gross per 24 hour  Intake 350 ml  Output 300 ml  Net 50 ml   Filed Weights   01/27/24 2222 01/28/24 0705  Weight: 74.3 kg 74.3 kg    Exam:   GEN: NAD SKIN: Warm and dry EYES: Anicteric ENT: MMM CV: RRR PULM: CTA B ABD: soft, ND, NT, +BS CNS: AAO x 1 (person), non focal EXT: No edema or tenderness       Data Reviewed:   I have personally reviewed following labs and imaging studies:  Labs: Labs show the following:   Basic Metabolic Panel: Recent Labs  Lab 01/27/24 1816 01/28/24 0450  NA 136 139  K 3.8 3.7  CL 98 104  CO2 27 27  GLUCOSE 111* 103*  BUN 19 18  CREATININE 0.72 0.84  CALCIUM 9.5 9.0   GFR Estimated Creatinine Clearance: 53.8 mL/min (by C-G formula based on SCr of 0.84 mg/dL). Liver Function Tests: Recent Labs  Lab 01/27/24 1816  AST 18  ALT 13  ALKPHOS 73  BILITOT 1.0  PROT 7.5  ALBUMIN 3.7   Recent Labs  Lab 01/27/24 1816  LIPASE 21   No results for input(s): AMMONIA in the last 168 hours. Coagulation profile No results for input(s): INR, PROTIME in the last 168 hours.  CBC: Recent Labs  Lab 01/27/24 1816 01/28/24 0450 01/29/24 0655  WBC 9.1 7.8 5.8  NEUTROABS 7.5  --   --   HGB 10.9* 10.1* 9.8*  HCT 33.4*  30.9* 29.8*  MCV 102.1* 101.6* 101.4*  PLT 130* 115* 117*   Cardiac Enzymes: No results for input(s): CKTOTAL, CKMB, CKMBINDEX, TROPONINI in the last 168 hours. BNP (last 3 results) No results for input(s): PROBNP in the last 8760 hours. CBG: No results for input(s): GLUCAP in the last 168 hours. D-Dimer: No results for input(s): DDIMER in the last 72 hours. Hgb A1c: No results for input(s): HGBA1C in the last 72 hours. Lipid  Profile: No results for input(s): CHOL, HDL, LDLCALC, TRIG, CHOLHDL, LDLDIRECT in the last 72 hours. Thyroid function studies: Recent Labs    01/27/24 1816  TSH 0.259*   Anemia work up: No results for input(s): VITAMINB12, FOLATE, FERRITIN, TIBC, IRON, RETICCTPCT in the last 72 hours. Sepsis Labs: Recent Labs  Lab 01/27/24 1816 01/27/24 2324 01/28/24 0450 01/29/24 0655  PROCALCITON <0.10  --   --   --   WBC 9.1  --  7.8 5.8  LATICACIDVEN 0.8 1.2 0.8  --     Microbiology Recent Results (from the past 240 hours)  Blood culture (routine x 2)     Status: None (Preliminary result)   Collection Time: 01/27/24  6:16 PM   Specimen: BLOOD  Result Value Ref Range Status   Specimen Description BLOOD BLOOD RIGHT FOREARM  Final   Special Requests   Final    BOTTLES DRAWN AEROBIC AND ANAEROBIC Blood Culture adequate volume   Culture   Final    NO GROWTH 2 DAYS Performed at [facility], [address], [place], [place] [address]    Report Status PENDING  Incomplete  Urine Culture     Status: Abnormal (Preliminary result)   Collection Time: 01/27/24  6:48 PM   Specimen: In/Out Cath Urine  Result Value Ref Range Status   Specimen Description   Final    IN/OUT CATH URINE Performed at [facility], [address] [place], [place] [address]    Special Requests   Final    NONE Performed at [facility], [address] [place], [place] [address]    Culture (A)  Final    >=100,000  COLONIES/mL ESCHERICHIA COLI SUSCEPTIBILITIES TO FOLLOW CULTURE REINCUBATED FOR BETTER GROWTH Performed at [facility], [address], [place], [place] [address]    Report Status PENDING  Incomplete  Blood culture (routine x 2)     Status: None (Preliminary result)   Collection Time: 01/27/24 11:24 PM   Specimen: Left Antecubital; Blood  Result Value Ref Range Status   Specimen Description   Final    LEFT ANTECUBITAL Performed at [facility], [address] [place], [place] [address]    Special Requests   Final    BOTTLES DRAWN AEROBIC AND ANAEROBIC Blood Culture results may not be optimal due to an inadequate volume of blood received in culture bottles Performed at [facility], [address] [place], [place] [address]    Culture  Setup Time   Final    Organism ID to follow AEROBIC BOTTLE ONLY GRAM POSITIVE COCCI CRITICAL RESULT CALLED TO, READ BACK BY AND VERIFIED WITH: TIFFANY GILCHRIST @ 01/28/2024 2004 AB Performed at [facility], [address], [place], [place] [address]    Culture   Final    VONNE POSITIVE COCCI TOO YOUNG TO READ CULTURE REINCUBATED FOR BETTER GROWTH Performed at [facility], [address], [place], [place] [address]    Report Status PENDING  Incomplete  Blood Culture ID Panel (Reflexed)     Status: Abnormal   Collection Time: 01/27/24 11:24 PM  Result Value Ref Range Status   Enterococcus faecalis NOT DETECTED NOT DETECTED Final   Enterococcus Faecium NOT DETECTED NOT DETECTED Final   Listeria monocytogenes NOT DETECTED NOT DETECTED Final   Staphylococcus species DETECTED (A) NOT DETECTED Final    Comment: CRITICAL RESULT CALLED TO, READ BACK BY AND VERIFIED WITH: TIFFANY GILCHRIST @ 01/28/2024 2004 AB  Staphylococcus aureus (BCID) NOT DETECTED NOT DETECTED Final   Staphylococcus epidermidis NOT DETECTED NOT DETECTED Final   Staphylococcus lugdunensis NOT DETECTED NOT DETECTED Final    Streptococcus species NOT DETECTED NOT DETECTED Final   Streptococcus agalactiae NOT DETECTED NOT DETECTED Final   Streptococcus pneumoniae NOT DETECTED NOT DETECTED Final   Streptococcus pyogenes NOT DETECTED NOT DETECTED Final   A.calcoaceticus-baumannii NOT DETECTED NOT DETECTED Final   Bacteroides fragilis NOT DETECTED NOT DETECTED Final   Enterobacterales NOT DETECTED NOT DETECTED Final   Enterobacter cloacae complex NOT DETECTED NOT DETECTED Final   Escherichia coli NOT DETECTED NOT DETECTED Final   Klebsiella aerogenes NOT DETECTED NOT DETECTED Final   Klebsiella oxytoca NOT DETECTED NOT DETECTED Final   Klebsiella pneumoniae NOT DETECTED NOT DETECTED Final   Proteus species NOT DETECTED NOT DETECTED Final   Salmonella species NOT DETECTED NOT DETECTED Final   Serratia marcescens NOT DETECTED NOT DETECTED Final   Haemophilus influenzae NOT DETECTED NOT DETECTED Final   Neisseria meningitidis NOT DETECTED NOT DETECTED Final   Pseudomonas aeruginosa NOT DETECTED NOT DETECTED Final   Stenotrophomonas maltophilia NOT DETECTED NOT DETECTED Final   Candida albicans NOT DETECTED NOT DETECTED Final   Candida auris NOT DETECTED NOT DETECTED Final   Candida glabrata NOT DETECTED NOT DETECTED Final   Candida krusei NOT DETECTED NOT DETECTED Final   Candida parapsilosis NOT DETECTED NOT DETECTED Final   Candida tropicalis NOT DETECTED NOT DETECTED Final   Cryptococcus neoformans/gattii NOT DETECTED NOT DETECTED Final    Comment: Performed at [facility], [address] [place], [place] [address]    Procedures and diagnostic studies:  US  Renal Result Date: 01/27/2024 CLINICAL DATA:  s/p recent cystography, lots of a sediment in urine. EXAM: RENAL / URINARY TRACT ULTRASOUND COMPLETE COMPARISON:  01/14/2022 FINDINGS: Right Kidney: Renal measurements: 10.2 x 5.1 x 5.4 cm = volume: 147 mL. 1.3 cm upper pole cyst. Normal echotexture. No hydronephrosis. Left Kidney: Renal  measurements: 10.7 x 5.7 x 5.4 cm = volume: 171 mL. Shadowing foci in the left lower pole may reflect nonobstructing stones measuring up to 8 mm. Normal echotexture. No hydronephrosis. Bladder: Mild bladder wall thickening and irregularity. Other: None. IMPRESSION: No acute findings.  No hydronephrosis. Probable nonobstructing left lower pole nephrolithiasis. Mild bladder wall thickening and irregularity. This could be related to cystitis or chronic bladder outlet obstruction. Electronically Signed   By: Franky Crease M.D.   On: 01/27/2024 22:08   CT Cervical Spine Wo Contrast Result Date: 01/27/2024 EXAM: CT CERVICAL SPINE WITHOUT CONTRAST 01/27/2024 07:31:25 PM TECHNIQUE: CT of the cervical spine was performed without the administration of intravenous contrast. Multiplanar reformatted images are provided for review. Automated exposure control, iterative reconstruction, and/or weight based adjustment of the mA/kV was utilized to reduce the radiation dose to as low as reasonably achievable. COMPARISON: None available. CLINICAL HISTORY: Fall pain. Pt to ED via [facility] from home. Per caregiver, pt has hx of AMS and UTI. Per caregiver, pt's mental status has been getting worse for the past five days and he has had stinky urine. EMS called out earlier today due to pt falling, but caregiver did not think pt needed to be transported. EMS called back out this evening as caregiver states pt has been in bed all day. BP 140/80; 91% ra; HR 90; Cbg 131; 98.7 oral. 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 Hardware is intact. No  acute fracture or traumatic malalignment. DEGENERATIVE CHANGES: No significant degenerative changes. SOFT TISSUES: No prevertebral soft tissue swelling. VASCULATURE: Atherosclerotic calcifications are present in the aortic arch and great vessel origins without focal stenosis or aneurysm.  Atherosclerotic calcifications are present at the carotid bifurcations bilaterally. IMPRESSION: 1. No acute abnormality of the cervical spine related to the fall. 2. 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 Electronically signed by: Lonni Necessary MD 01/27/2024 07:40 PM EDT RP Workstation: [id number]   CT Head Wo Contrast Result Date: 01/27/2024 EXAM: CT HEAD WITHOUT CONTRAST 01/27/2024 07:31:25 PM TECHNIQUE: CT of the head was performed without the administration of intravenous contrast. Automated exposure control, iterative reconstruction, and/or weight based adjustment of the mA/kV was utilized to reduce the radiation dose to as low as reasonably achievable. COMPARISON: 10/23/2023 CLINICAL HISTORY: Patient presented to the ED via [facility] from home following a fall. Per caregiver, the patient has a history of AMS and UTI, with mental status worsening over the past 5 days and stinky urine. EMS was called earlier today due to the fall, and again this evening as the patient has been in bed all day. FINDINGS: BRAIN AND VENTRICLES: No acute hemorrhage. No evidence of acute infarct. No hydrocephalus. No extra-axial collection. No mass effect or midline shift. Moderate atrophy and white matter changes are similar to the prior exam. This most likely reflects the sequelae of chronic microvascular ischemia. Consider atherosclerosis. ORBITS: No acute abnormality. SINUSES: No acute abnormality. SOFT TISSUES AND SKULL: No acute soft tissue abnormality. No skull fracture. IMPRESSION: 1. No acute intracranial abnormality. 2. Moderate cerebral atrophy and white matter changes, unchanged from prior, likely sequelae of chronic microvascular ischemia. Electronically signed by: Lonni Necessary MD 01/27/2024 07:38 PM EDT RP Workstation: [id number]               LOS: 1 day   Mariadelcarmen Corella  [facility]   Pager on [email or web address]. If 7PM-7AM, please  contact night-coverage at [email or web address]     01/29/2024, 3:04 PM

## 2024-01-29 NOTE — Progress Notes (Signed)
 OT Cancellation Note  Patient Details Name: Bryan Howard. MRN: [id number] DOB: July 19, 1932   Cancelled Treatment:    Reason Eval/Treat Not Completed: Fatigue/lethargy limiting ability to participate;Other (comment) (son reports patient just had PT and just spoke with PT and is not communicative today (which is not patients norm) and requesting OT return after lunch). OT to follow up as able to evaluate patients OT needs  [name] Clause 01/29/2024, 9:55 AM

## 2024-01-30 DIAGNOSIS — N390 Urinary tract infection, site not specified: Secondary | ICD-10-CM | POA: Diagnosis not present

## 2024-01-30 MED ORDER — CEPHALEXIN 500 MG PO CAPS
500.0000 mg | ORAL_CAPSULE | Freq: Three times a day (TID) | ORAL | Status: DC
  Administered 2024-01-30: 500 mg via ORAL
  Filled 2024-01-30: qty 1

## 2024-01-30 MED ORDER — CEPHALEXIN 500 MG PO CAPS: 500.0000 mg | ORAL_CAPSULE | Freq: Three times a day (TID) | ORAL | 0 refills | Status: AC

## 2024-01-30 MED ORDER — SENNA 8.6 MG PO TABS: 1.0000 | ORAL_TABLET | Freq: Two times a day (BID) | ORAL | Status: DC

## 2024-01-30 NOTE — Plan of Care (Signed)

## 2024-01-30 NOTE — [place] FL2 (Signed)
 [place]  MEDICAID FL2 LEVEL OF CARE FORM     IDENTIFICATION  Patient Name: Bryan Howard. Birthdate: 1932-08-03 Sex: male Admission Date (Current Location): 01/27/2024  [place] Number:  Chiropodist and Address:  [facility], [address] [place], [place] [address]      Provider Number: [id number]  Attending Physician Name and Address:  Jens Durand, MD  Relative Name and Phone Number:       Current Level of Care: Hospital Recommended Level of Care: Skilled Nursing Facility Prior Approval Number:    Date Approved/Denied:   PASRR Number: Pending  Discharge Plan: SNF    Current Diagnoses: Patient Active Problem List   Diagnosis Date Noted   Urinary tract infection 01/28/2024   Complicated urinary tract infection/ recurrent UTI 01/27/2024   Chronic heart failure with preserved ejection fraction (HFpEF) (HCC) 01/27/2024   Acute metabolic encephalopathy 01/27/2024   History of orchiectomy 01/27/2024   Hypotension 01/27/2024   History of spinal surgery 01/27/2024   Moderate aortic stenosis 09/29/2023   Mild cognitive impairment 09/29/2023   Chronic obstructive pulmonary disease (HCC) 11/10/2022   History of pulmonary embolism 09/29/2022   Essential hypertension 12/11/2017   Disequilibrium 09/29/2016   PD (perceptive deafness), asymmetrical 09/29/2016   Generalized muscle weakness 08/20/2016   Primary osteoarthritis of right hip 07/16/2016   Right lumbar radiculitis 06/22/2016   Greater trochanteric bursitis of left hip 06/18/2016   Greater trochanteric bursitis of right hip 06/18/2016   Choledocholithiasis 03/19/2016   Obesity, unspecified 12/28/2011   Transfusion of blood product refused for religious reason 12/14/2011   OSA (obstructive sleep apnea) 09/08/2011   Hypothyroidism 10/13/2007   HYPERLIPIDEMIA 10/13/2007   Coronary atherosclerosis 10/13/2007   External hemorrhoids 10/13/2007   ESOPHAGEAL  STRICTURE 10/13/2007   GERD 10/13/2007   Diaphragmatic hernia 10/13/2007   Diverticulosis of colon 10/13/2007   History of TIA (transient ischemic attack) 10/13/2007   History of colonic polyps 10/13/2007    Orientation RESPIRATION BLADDER Height & Weight     Self  Normal Incontinent Weight: 74.3 kg Height:  5' 5.98 (167.6 cm)  BEHAVIORAL SYMPTOMS/MOOD NEUROLOGICAL BOWEL NUTRITION STATUS      Incontinent Diet (Heart Healthy)  AMBULATORY STATUS COMMUNICATION OF NEEDS Skin   Extensive Assist Verbally Skin abrasions                       Personal Care Assistance Level of Assistance              Functional Limitations Info  Hearing   Hearing Info: Impaired      SPECIAL CARE FACTORS FREQUENCY  PT (By licensed PT), OT (By licensed OT)                    Contractures Contractures Info: Not present    Additional Factors Info  Code Status, Allergies Code Status Info: DNR Allergies Info: Captopril, Quinolones           Current Medications (01/30/2024):  This is the current hospital active medication list Current Facility-Administered Medications  Medication Dose Route Frequency Provider Last Rate Last Admin   acetaminophen (TYLENOL) tablet 650 mg  650 mg Oral Q6H PRN Duncan, Hazel V, MD       Or   acetaminophen (TYLENOL) suppository 650 mg  650 mg Rectal Q6H PRN Duncan, Hazel V, MD       albuterol (PROVENTIL) (2.5 MG/3ML) 0.083% nebulizer solution 2.5 mg  2.5 mg  Nebulization Q2H PRN Cleatus Delayne GAILS, MD       aspirin EC tablet 81 mg  81 mg Oral Daily Cleatus Delayne V, MD   81 mg at 01/30/24 0848   atorvastatin (LIPITOR) tablet 80 mg  80 mg Oral Daily Duncan, Hazel V, MD   80 mg at 01/30/24 0848   cephALEXin (KEFLEX) capsule 500 mg  500 mg Oral Q8H Jens Durand, MD   500 mg at 01/30/24 0848   enoxaparin (LOVENOX) injection 40 mg  40 mg Subcutaneous Q24H Duncan, Hazel V, MD   40 mg at 01/29/24 2059   HYDROcodone-acetaminophen (NORCO/VICODIN) 5-325 MG per  tablet 1-2 tablet  1-2 tablet Oral Q4H PRN Duncan, Hazel V, MD       levothyroxine (SYNTHROID) tablet 112 mcg  112 mcg Oral Q0600 Duncan, Hazel V, MD   112 mcg at 01/30/24 0607   ondansetron (ZOFRAN) tablet 4 mg  4 mg Oral Q6H PRN Duncan, Hazel V, MD       Or   ondansetron (ZOFRAN) injection 4 mg  4 mg Intravenous Q6H PRN Duncan, Hazel V, MD         Discharge Medications: Please see discharge summary for a list of discharge medications.  Relevant Imaging Results:  Relevant Lab Results:   Additional Information ss [id number]  Corean ONEIDA Haddock, RN

## 2024-01-30 NOTE — Progress Notes (Signed)
 Discharge instructions reviewed with the son. IV removed. Patient assisted by myself and T'niya into the car.

## 2024-01-30 NOTE — Discharge Summary (Signed)
 Physician Discharge Summary   Patient: Bryan Howard. MRN: [id number] DOB: February 25, 1933  Admit date:     01/27/2024  Discharge date: 01/30/24  Discharge Physician: AIDA CHO   PCP: Jyl Railing, MD   Recommendations at discharge:   Follow-up with PCP in 1 week Follow-up with urologist at [facility] as scheduled on 02/09/2024  Discharge Diagnoses: Principal Problem:   Complicated urinary tract infection/ recurrent UTI Active Problems:   Acute metabolic encephalopathy   Hypotension   Chronic heart failure with preserved ejection fraction (HFpEF) (HCC)   Hypothyroidism   History of TIA (transient ischemic attack)   Moderate aortic stenosis   OSA (obstructive sleep apnea)   History of pulmonary embolism   Essential hypertension   Chronic obstructive pulmonary disease (HCC)   History of orchiectomy   History of spinal surgery   Urinary tract infection  Resolved Problems:   * No resolved hospital problems. *  Hospital Course:  Bryan Howard. is a 89 y.o. male  with medical history significant for HTN, PUD,TIA(2012), HFpEF, COPD, OSA(not on CPAP, mild cognitive impairment, hx of PE, hypothyroidism, AAA s/p repair, ,  prostate cancer s/p cryoablation (2013),recurrent right epidiymoorchitis  s/p right  orchiectomy(2023), ,. recurrent UTIs most recently E. 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  He presented to the hospital with altered mental status/lethargy.  He lives at home alone and has private caregivers.  He had become increasingly lethargic and subsequently had a fall without injury about 2 days prior to admission.  His urine was noted to be malodorous.  Reportedly, he normally has change in mental status when he has a UTI.     He was admitted to the hospital for acute complicated UTI.      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 coli.  S/p treatment with 3 days of IV ceftriaxone .  He will be discharged on Keflex for 4 more days to complete 7 days of treatment. 1 out of 4 blood culture bottles grew out staph species which is likely a contaminant.  No fever or leukocytosis.     Acute metabolic encephalopathy, delirium: Resolved.  Patient's mental status is back to baseline   Chronic heart failure with preserved EF, moderate aortic stenosis, hypertension, history of AAA repair: Compensated     Thrombocytopenia: Platelet count is stable.    COPD: Stable     General weakness: PT recommended discharge to SNF.  However, patient declined SNF.  Bryan Howard, son, at the bedside said that he supports his dad's decision.  He said the last time he was discharged to SNF, he left the facility 2 days later.  Patient and son prefer that patient be discharged home with home health therapy.  He also has private caregivers at home. They both understand that patient is at increased risk for falls that could potentially lead to serious injuries.    History of pulmonary embolism in June 2024 (Eliquis was recently discontinued by PCP 01/16/2024)     Comorbidities include history of orchiectomy for recurrent epididymoorchitis in 2023, history of prostate cancer s/p cryoablation in 2013, history of spinal surgery, chronic neck, back and shoulder pain, OSA intolerant of CPAP, hypothyroidism, history of TIA   Bryan Howard, son, confirmed that patient no longer takes Eliquis.  He also said that he has not  been on aspirin for over a year.  It appears that aspirin was discontinued when patient was started on Eliquis.  Recommended resuming low-dose aspirin since he is no longer on Eliquis.  However, patient and son declined at this time. Of note, patient has a history of TIA and is at increased risk for cardiovascular complications. He prefers to follow-up with PCP  to discuss this further.    His condition has improved and he is deemed medically stable for discharge.     ADDENDUM  Multidrug-resistant ESBL Klebsiella pneumoniae (50,000 colonies) was added to urine culture report after patient had been discharged from the hospital.  The significance of this is unclear since patient had improved with empiric IV ceftriaxone .  I do not know how his symptoms had resolved and he was afebrile and had a normal white cell count prior to discharge.      Consultants: None Procedures performed: None Disposition: Home health Diet recommendation:  Discharge Diet Orders (From admission, onward)     Start     Ordered   01/30/24 0000  Diet - low sodium heart healthy        01/30/24 1031           Cardiac diet DISCHARGE MEDICATION: Allergies as of 01/30/2024       Reactions   Captopril Cough   Quinolones Other (See Comments)   AAA patient- avoid if possible        Medication List     STOP taking these medications    apixaban 2.5 MG Tabs tablet Commonly known as: ELIQUIS   aspirin EC 81 MG tablet   oxybutynin 10 MG 24 hr tablet Commonly known as: DITROPAN-XL       TAKE these medications    acetaminophen 650 MG CR tablet Commonly known as: TYLENOL Take one tablet every morning and two tablets nightly   Alpha-Lipoic Acid 200 MG Caps Take 200 mg by mouth daily.   ascorbic acid 500 MG tablet Commonly known as: VITAMIN C Take 500 mg by mouth 2 (two) times daily.   atorvastatin 80 MG tablet Commonly known as: LIPITOR Take 80 mg by mouth daily.   cephALEXin 500 MG capsule Commonly known as: KEFLEX Take 1 capsule (500 mg total) by mouth every 8 (eight) hours for 4 days.   Cholecalciferol 25 MCG (1000 UT) tablet Take 1,000 Units by mouth daily.   [employer] VITAMIN B12 1000 MCG tablet Generic drug: cyanocobalamin Take 1,000 mcg by mouth daily.   DULoxetine HCl 30 MG Csdr Take 30 mg by mouth daily.   folic acid 800 MCG  tablet Commonly known as: FOLVITE Take 800 mcg by mouth at bedtime.   gabapentin 300 MG capsule Commonly known as: NEURONTIN Take 300 mg by mouth at bedtime.   HYDROcodone-acetaminophen 7.5-325 MG tablet Commonly known as: NORCO Take 1 tablet by mouth 2 (two) times daily as needed for moderate pain (pain score 4-6) or severe pain (pain score 7-10).   levothyroxine 112 MCG tablet Commonly known as: SYNTHROID Take 112 mcg by mouth daily before breakfast.   losartan 50 MG tablet Commonly known as: COZAAR Take 50 mg by mouth daily.   naloxone 4 MG/0.1ML Liqd nasal spray kit Commonly known as: NARCAN Place 1 spray into the nose once as needed.   omeprazole 20 MG capsule Commonly known as: PRILOSEC Take 20 mg by mouth 2 (two) times daily as needed.   polyethylene glycol 17 g packet Commonly known as: MIRALAX / GLYCOLAX Take 17  g by mouth daily as needed for mild constipation or moderate constipation.   sennosides-docusate sodium 8.6-50 MG tablet Commonly known as: SENOKOT-S Take 1 tablet by mouth daily.        Discharge Exam: Filed Weights   01/27/24 2222 01/28/24 0705  Weight: 74.3 kg 74.3 kg   GEN: NAD SKIN: Warm and dry EYES: No pallor or icterus ENT: MMM CV: RRR PULM: CTA B ABD: soft, ND, NT, +BS CNS: AAO x 2 (person and place), non focal EXT: No edema or tenderness   Condition at discharge: good  The results of significant diagnostics from this hospitalization (including imaging, microbiology, ancillary and laboratory) are listed below for reference.   Imaging Studies: US  Renal Result Date: 01/27/2024 CLINICAL DATA:  s/p recent cystography, lots of a sediment in urine. EXAM: RENAL / URINARY TRACT ULTRASOUND COMPLETE COMPARISON:  01/14/2022 FINDINGS: Right Kidney: Renal measurements: 10.2 x 5.1 x 5.4 cm = volume: 147 mL. 1.3 cm upper pole cyst. Normal echotexture. No hydronephrosis. Left Kidney: Renal measurements: 10.7 x 5.7 x 5.4 cm = volume: 171 mL.  Shadowing foci in the left lower pole may reflect nonobstructing stones measuring up to 8 mm. Normal echotexture. No hydronephrosis. Bladder: Mild bladder wall thickening and irregularity. Other: None. IMPRESSION: No acute findings.  No hydronephrosis. Probable nonobstructing left lower pole nephrolithiasis. Mild bladder wall thickening and irregularity. This could be related to cystitis or chronic bladder outlet obstruction. Electronically Signed   By: Franky Crease M.D.   On: 01/27/2024 22:08   CT Cervical Spine Wo Contrast Result Date: 01/27/2024 EXAM: CT CERVICAL SPINE WITHOUT CONTRAST 01/27/2024 07:31:25 PM TECHNIQUE: CT of the cervical spine was performed without the administration of intravenous contrast. Multiplanar reformatted images are provided for review. Automated exposure control, iterative reconstruction, and/or weight based adjustment of the mA/kV was utilized to reduce the radiation dose to as low as reasonably achievable. COMPARISON: None available. CLINICAL HISTORY: Fall pain. Pt to ED via [facility] from home. Per caregiver, pt has hx of AMS and UTI. Per caregiver, pt's mental status has been getting worse for the past five days and he has had stinky urine. EMS called out earlier today due to pt falling, but caregiver did not think pt needed to be transported. EMS called back out this evening as caregiver states pt has been in bed all day. BP 140/80; 91% ra; HR 90; Cbg 131; 98.7 oral. 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 Hardware is intact. No acute fracture or traumatic malalignment. DEGENERATIVE CHANGES: No significant degenerative changes. SOFT TISSUES: No prevertebral soft tissue swelling. VASCULATURE: Atherosclerotic calcifications are present in the aortic arch and great vessel origins without focal stenosis or aneurysm. Atherosclerotic calcifications are present at the  carotid bifurcations bilaterally. IMPRESSION: 1. No acute abnormality of the cervical spine related to the fall. 2. 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 Electronically signed by: Lonni Necessary MD 01/27/2024 07:40 PM EDT RP Workstation: [id number]   CT Head Wo Contrast Result Date: 01/27/2024 EXAM: CT HEAD WITHOUT CONTRAST 01/27/2024 07:31:25 PM TECHNIQUE: CT of the head was performed without the administration of intravenous contrast. Automated exposure control, iterative reconstruction, and/or weight based adjustment of the mA/kV was utilized to reduce the radiation dose to as low as reasonably achievable. COMPARISON: 10/23/2023 CLINICAL HISTORY: Patient presented to the ED via [facility] from home following a fall. Per caregiver, the patient has a history  of AMS and UTI, with mental status worsening over the past 5 days and stinky urine. EMS was called earlier today due to the fall, and again this evening as the patient has been in bed all day. FINDINGS: BRAIN AND VENTRICLES: No acute hemorrhage. No evidence of acute infarct. No hydrocephalus. No extra-axial collection. No mass effect or midline shift. Moderate atrophy and white matter changes are similar to the prior exam. This most likely reflects the sequelae of chronic microvascular ischemia. Consider atherosclerosis. ORBITS: No acute abnormality. SINUSES: No acute abnormality. SOFT TISSUES AND SKULL: No acute soft tissue abnormality. No skull fracture. IMPRESSION: 1. No acute intracranial abnormality. 2. Moderate cerebral atrophy and white matter changes, unchanged from prior, likely sequelae of chronic microvascular ischemia. Electronically signed by: Lonni Necessary MD 01/27/2024 07:38 PM EDT RP Workstation: [id number]   DG Chest Portable 1 View Result Date: 01/17/2024 CLINICAL DATA:  Diaphoresis EXAM: PORTABLE CHEST 1 VIEW COMPARISON:  12/19/2017, 05/10/2009 FINDINGS: Low lung volumes. No  acute airspace disease or effusion. Atelectasis or scarring at the bases. Stable cardiomediastinal silhouette with tortuous aorta. Aortic atherosclerosis. No pneumothorax. Chronic widening of left AC joint. High-riding humeral heads bilaterally consistent with rotator cuff disease. Advanced bilateral shoulder degenerative change. Partially visualized hardware in the cervical spine IMPRESSION: Low lung volumes with atelectasis or scarring at the bases. Electronically Signed   By: Luke Bun M.D.   On: 01/17/2024 22:42    Microbiology: Results for orders placed or performed during the hospital encounter of 01/27/24  Blood culture (routine x 2)     Status: None (Preliminary result)   Collection Time: 01/27/24  6:16 PM   Specimen: BLOOD  Result Value Ref Range Status   Specimen Description BLOOD BLOOD RIGHT FOREARM  Final   Special Requests   Final    BOTTLES DRAWN AEROBIC AND ANAEROBIC Blood Culture adequate volume   Culture   Final    NO GROWTH 3 DAYS Performed at [facility], [address] [place], [place] [address]    Report Status PENDING  Incomplete  Urine Culture     Status: Abnormal (Preliminary result)   Collection Time: 01/27/24  6:48 PM   Specimen: In/Out Cath Urine  Result Value Ref Range Status   Specimen Description   Final    IN/OUT CATH URINE Performed at [facility], [address], [place], [place] [address]    Special Requests   Final    NONE Performed at [facility], [address], [place], [place] [address]    Culture (A)  Final    >=100,000 COLONIES/mL ESCHERICHIA COLI 50,000 COLONIES/mL KLEBSIELLA PNEUMONIAE SUSCEPTIBILITIES TO FOLLOW Performed at [facility], [address] [place], [place] [address]    Report Status PENDING  Incomplete   Organism ID, Bacteria ESCHERICHIA COLI (A)  Final      Susceptibility   Escherichia coli - MIC*    AMPICILLIN >=32 RESISTANT Resistant     CEFAZOLIN (URINE) Value in next row  Sensitive      8 SENSITIVEThis is a modified FDA-approved test that has been validated and its performance characteristics determined by the reporting laboratory.  This laboratory is certified under the Clinical Laboratory Improvement Amendments CLIA as qualified to perform high complexity clinical laboratory testing.    CEFEPIME  Value in next row Sensitive      8 SENSITIVEThis is a modified FDA-approved test that has been validated and its performance characteristics determined by the reporting laboratory.  This laboratory is  certified under the Clinical Laboratory Improvement Amendments CLIA as qualified to perform high complexity clinical laboratory testing.    ERTAPENEM Value in next row Sensitive      8 SENSITIVEThis is a modified FDA-approved test that has been validated and its performance characteristics determined by the reporting laboratory.  This laboratory is certified under the Clinical Laboratory Improvement Amendments CLIA as qualified to perform high complexity clinical laboratory testing.    CEFTRIAXONE  Value in next row Sensitive      8 SENSITIVEThis is a modified FDA-approved test that has been validated and its performance characteristics determined by the reporting laboratory.  This laboratory is certified under the Clinical Laboratory Improvement Amendments CLIA as qualified to perform high complexity clinical laboratory testing.    CIPROFLOXACIN Value in next row Sensitive      8 SENSITIVEThis is a modified FDA-approved test that has been validated and its performance characteristics determined by the reporting laboratory.  This laboratory is certified under the Clinical Laboratory Improvement Amendments CLIA as qualified to perform high complexity clinical laboratory testing.    GENTAMICIN Value in next row Sensitive      8 SENSITIVEThis is a modified FDA-approved test that has been validated and its performance characteristics determined by the reporting laboratory.  This  laboratory is certified under the Clinical Laboratory Improvement Amendments CLIA as qualified to perform high complexity clinical laboratory testing.    NITROFURANTOIN Value in next row Sensitive      8 SENSITIVEThis is a modified FDA-approved test that has been validated and its performance characteristics determined by the reporting laboratory.  This laboratory is certified under the Clinical Laboratory Improvement Amendments CLIA as qualified to perform high complexity clinical laboratory testing.    TRIMETH/SULFA Value in next row Sensitive      8 SENSITIVEThis is a modified FDA-approved test that has been validated and its performance characteristics determined by the reporting laboratory.  This laboratory is certified under the Clinical Laboratory Improvement Amendments CLIA as qualified to perform high complexity clinical laboratory testing.    AMPICILLIN/SULBACTAM Value in next row Intermediate      8 SENSITIVEThis is a modified FDA-approved test that has been validated and its performance characteristics determined by the reporting laboratory.  This laboratory is certified under the Clinical Laboratory Improvement Amendments CLIA as qualified to perform high complexity clinical laboratory testing.    PIP/TAZO Value in next row Sensitive      <=4 SENSITIVEThis is a modified FDA-approved test that has been validated and its performance characteristics determined by the reporting laboratory.  This laboratory is certified under the Clinical Laboratory Improvement Amendments CLIA as qualified to perform high complexity clinical laboratory testing.    MEROPENEM Value in next row Sensitive      <=4 SENSITIVEThis is a modified FDA-approved test that has been validated and its performance characteristics determined by the reporting laboratory.  This laboratory is certified under the Clinical Laboratory Improvement Amendments CLIA as qualified to perform high complexity clinical laboratory testing.    *  >=100,000 COLONIES/mL ESCHERICHIA COLI  Blood culture (routine x 2)     Status: None (Preliminary result)   Collection Time: 01/27/24 11:24 PM   Specimen: Left Antecubital; Blood  Result Value Ref Range Status   Specimen Description   Final    LEFT ANTECUBITAL Performed at [facility], [address] [place], [place] [address]    Special Requests   Final    BOTTLES DRAWN AEROBIC AND ANAEROBIC Blood Culture results may not  be optimal due to an inadequate volume of blood received in culture bottles Performed at [facility], [address] [place], [place] [address]    Culture  Setup Time   Final    Organism ID to follow AEROBIC BOTTLE ONLY GRAM POSITIVE COCCI CRITICAL RESULT CALLED TO, READ BACK BY AND VERIFIED WITH: TIFFANY GILCHRIST @ 01/28/2024 2004 AB Performed at [facility], [address] [place], [place] [address]    Culture   Final    VONNE POSITIVE COCCI CULTURE REINCUBATED FOR BETTER GROWTH Performed at [facility], [address] [place], [place] [address]    Report Status PENDING  Incomplete  Blood Culture ID Panel (Reflexed)     Status: Abnormal   Collection Time: 01/27/24 11:24 PM  Result Value Ref Range Status   Enterococcus faecalis NOT DETECTED NOT DETECTED Final   Enterococcus Faecium NOT DETECTED NOT DETECTED Final   Listeria monocytogenes NOT DETECTED NOT DETECTED Final   Staphylococcus species DETECTED (A) NOT DETECTED Final    Comment: CRITICAL RESULT CALLED TO, READ BACK BY AND VERIFIED WITH: TIFFANY GILCHRIST @ 01/28/2024 2004 AB    Staphylococcus aureus (BCID) NOT DETECTED NOT DETECTED Final   Staphylococcus epidermidis NOT DETECTED NOT DETECTED Final   Staphylococcus lugdunensis NOT DETECTED NOT DETECTED Final   Streptococcus species NOT DETECTED NOT DETECTED Final   Streptococcus agalactiae NOT DETECTED NOT DETECTED Final   Streptococcus pneumoniae NOT DETECTED NOT DETECTED Final   Streptococcus pyogenes  NOT DETECTED NOT DETECTED Final   A.calcoaceticus-baumannii NOT DETECTED NOT DETECTED Final   Bacteroides fragilis NOT DETECTED NOT DETECTED Final   Enterobacterales NOT DETECTED NOT DETECTED Final   Enterobacter cloacae complex NOT DETECTED NOT DETECTED Final   Escherichia coli NOT DETECTED NOT DETECTED Final   Klebsiella aerogenes NOT DETECTED NOT DETECTED Final   Klebsiella oxytoca NOT DETECTED NOT DETECTED Final   Klebsiella pneumoniae NOT DETECTED NOT DETECTED Final   Proteus species NOT DETECTED NOT DETECTED Final   Salmonella species NOT DETECTED NOT DETECTED Final   Serratia marcescens NOT DETECTED NOT DETECTED Final   Haemophilus influenzae NOT DETECTED NOT DETECTED Final   Neisseria meningitidis NOT DETECTED NOT DETECTED Final   Pseudomonas aeruginosa NOT DETECTED NOT DETECTED Final   Stenotrophomonas maltophilia NOT DETECTED NOT DETECTED Final   Candida albicans NOT DETECTED NOT DETECTED Final   Candida auris NOT DETECTED NOT DETECTED Final   Candida glabrata NOT DETECTED NOT DETECTED Final   Candida krusei NOT DETECTED NOT DETECTED Final   Candida parapsilosis NOT DETECTED NOT DETECTED Final   Candida tropicalis NOT DETECTED NOT DETECTED Final   Cryptococcus neoformans/gattii NOT DETECTED NOT DETECTED Final    Comment: Performed at [facility], [address] [place], [place] [address]    Labs: CBC: Recent Labs  Lab 01/27/24 1816 01/28/24 0450 01/29/24 0655  WBC 9.1 7.8 5.8  NEUTROABS 7.5  --   --   HGB 10.9* 10.1* 9.8*  HCT 33.4* 30.9* 29.8*  MCV 102.1* 101.6* 101.4*  PLT 130* 115* 117*   Basic Metabolic Panel: Recent Labs  Lab 01/27/24 1816 01/28/24 0450  NA 136 139  K 3.8 3.7  CL 98 104  CO2 27 27  GLUCOSE 111* 103*  BUN 19 18  CREATININE 0.72 0.84  CALCIUM 9.5 9.0   Liver Function Tests: Recent Labs  Lab 01/27/24 1816  AST 18  ALT 13  ALKPHOS 73  BILITOT 1.0  PROT 7.5  ALBUMIN 3.7  CBG: No results for input(s): GLUCAP in  the last 168 hours.  Discharge time spent: greater than 30 minutes.  Signed: AIDA CHO, MD [facility] 01/30/2024

## 2024-01-30 NOTE — TOC Initial Note (Addendum)
 Transition of Care (TOC) - Initial/Assessment Note    Patient Details  Name: Bryan Howard. MRN: [id number] Date of Birth: [birth year]  Transition of Care [facility]) CM/SW Contact:    Corean ONEIDA Haddock, RN Phone Number: 01/30/2024, 9:48 AM  Clinical Narrative:                 Per chart review patient admitted from home A&O x1 Baseline lives at home alone  Therapy recommending SNF PASRR pending - once forms signed by MD, will upload to NCmust Fl2 sent for signature Bed search initiated  CM to follow up with son   Update:  son at bedside, he confirms plan is to return home with private pay care givers (6 days a week, 8am-7pm) and home health services through [facility].  He states patient has RW, platform walker, and electric scooter in the home.  Son to transport at discharge        Patient Goals and CMS Choice            Expected Discharge Plan and Services                                              Prior Living Arrangements/Services                       Activities of Daily Living      Permission Sought/Granted                  Emotional Assessment              Admission diagnosis:  Urinary tract infection [N39.0] Acute encephalopathy [G93.40] Urinary tract infection without hematuria, site unspecified [N39.0] Patient Active Problem List   Diagnosis Date Noted   Urinary tract infection 01/28/2024   Complicated urinary tract infection/ recurrent UTI 01/27/2024   Chronic heart failure with preserved ejection fraction (HFpEF) (HCC) 01/27/2024   Acute metabolic encephalopathy 01/27/2024   History of orchiectomy 01/27/2024   Hypotension 01/27/2024   History of spinal surgery 01/27/2024   Moderate aortic stenosis 09/29/2023   Mild cognitive impairment 09/29/2023   Chronic obstructive pulmonary disease (HCC) 11/10/2022   History of pulmonary embolism 09/29/2022   Essential hypertension 12/11/2017   Disequilibrium 09/29/2016    PD (perceptive deafness), asymmetrical 09/29/2016   Generalized muscle weakness 08/20/2016   Primary osteoarthritis of right hip 07/16/2016   Right lumbar radiculitis 06/22/2016   Greater trochanteric bursitis of left hip 06/18/2016   Greater trochanteric bursitis of right hip 06/18/2016   Choledocholithiasis 03/19/2016   Obesity, unspecified 12/28/2011   Transfusion of blood product refused for religious reason 12/14/2011   OSA (obstructive sleep apnea) 09/08/2011   Hypothyroidism 10/13/2007   HYPERLIPIDEMIA 10/13/2007   Coronary atherosclerosis 10/13/2007   External hemorrhoids 10/13/2007   ESOPHAGEAL STRICTURE 10/13/2007   GERD 10/13/2007   Diaphragmatic hernia 10/13/2007   Diverticulosis of colon 10/13/2007   History of TIA (transient ischemic attack) 10/13/2007   History of colonic polyps 10/13/2007   PCP:  Jyl Railing, MD Pharmacy:   [facility] [phone] - [place], [place] - [address] [place] [address] Phone: [phone] Fax: [phone]  [facility] [address]), [place] - [address] [place]) [place] [address] Phone: [phone] Fax: [phone]     Social Drivers of  Health (SDOH) Social History: SDOH Screenings   Food Insecurity: No Food Insecurity (01/28/2024)  Housing: Low Risk  (01/28/2024)  Transportation Needs: No Transportation Needs (01/28/2024)  Utilities: Not At Risk (01/28/2024)  Financial Resource Strain: Low Risk  (01/25/2024)   Received from [facility]  Physical Activity: Insufficiently Active (01/04/2024)   Received from [facility]  Social Connections: Socially Integrated (01/28/2024)  Recent Concern: Social Connections - Socially Isolated (01/04/2024)   Received from [facility]  Stress: No Stress Concern Present (01/04/2024)   Received from [facility]  Tobacco Use: Medium Risk (01/27/2024)   Health Literacy: Inadequate Health Literacy (01/04/2024)   Received from [facility]   SDOH Interventions: Housing Interventions: Patient Declined Transportation Interventions: Patient Declined Utilities Interventions: Patient Declined Social Connections Interventions: Patient Declined   Readmission Risk Interventions     No data to display

## 2024-01-30 NOTE — TOC PASRR Note (Signed)
 RE: Bryan Howard Date of Birth: Sep 08, 2032 Date: 01/30/24      To Whom It May Concern:   Please be advised that the above-named patient will require a short-term nursing home stay - anticipated 30 days or less for rehabilitation and strengthening.  The plan is for return home

## 2024-01-31 LAB — URINE CULTURE: Culture: >=100000 — AB

## 2024-01-31 LAB — CULTURE, BLOOD (ROUTINE X 2)

## 2024-02-01 LAB — CULTURE, BLOOD (ROUTINE X 2)
Culture: NO GROWTH
Special Requests: ADEQUATE

## 2024-02-07 ENCOUNTER — Encounter: Payer: Self-pay | Admitting: Emergency Medicine

## 2024-02-07 ENCOUNTER — Emergency Department

## 2024-02-07 ENCOUNTER — Emergency Department
Admission: EM | Admit: 2024-02-07 | Discharge: 2024-02-08 | Disposition: A | Discharge status: Home / Self Care | Attending: Emergency Medicine | Admitting: Emergency Medicine

## 2024-02-07 ENCOUNTER — Other Ambulatory Visit: Payer: Self-pay

## 2024-02-07 DIAGNOSIS — J449 Chronic obstructive pulmonary disease, unspecified: Secondary | ICD-10-CM | POA: Diagnosis not present

## 2024-02-07 DIAGNOSIS — K5900 Constipation, unspecified: Secondary | ICD-10-CM | POA: Diagnosis present

## 2024-02-07 DIAGNOSIS — K5641 Fecal impaction: Secondary | ICD-10-CM | POA: Insufficient documentation

## 2024-02-07 MED ORDER — POLYETHYLENE GLYCOL 3350 17 G PO PACK
34.0000 g | PACK | Freq: Once | ORAL | Status: AC
  Administered 2024-02-07: 34 g via ORAL
  Filled 2024-02-07: qty 2

## 2024-02-07 NOTE — ED Triage Notes (Signed)
 Pt arrives via EMS from home for constipation x 1 week, no meds taken at home.

## 2024-02-07 NOTE — ED Provider Notes (Signed)
 [facility] Provider Note    Event Date/Time   First MD Initiated Contact with Patient 02/07/24 2142     (approximate)   History   Constipation   HPI  Bryan Howard. is a 89 y.o. male who presents to the ED for evaluation of Constipation   Review of medical DC summary from 10/13.  Admitted for encephalopathy and weakness associated with UTI. Otherwise history of diastolic dysfunction, COPD, remote PE no longer on Eliquis.  Patient presents to the ED with 1 day of increasing pressure to his rectum superimposed on 1 week of constipation.  Denies any emesis or changes to his urine, fevers or anterior abdominal pain   Physical Exam   Triage Vital Signs: ED Triage Vitals  Encounter Vitals Group     BP 02/07/24 2138 124/85     Girls Systolic BP Percentile --      Girls Diastolic BP Percentile --      Boys Systolic BP Percentile --      Boys Diastolic BP Percentile --      Pulse Rate 02/07/24 2138 64     Resp 02/07/24 2138 15     Temp 02/07/24 2138 97.6 F (36.4 C)     Temp Source 02/07/24 2138 Oral     SpO2 02/07/24 2138 99 %     Weight 02/07/24 2135 163 lb 12.8 oz (74.3 kg)     Height 02/07/24 2135 5' 5 (1.651 m)     Head Circumference --      Peak Flow --      Pain Score 02/07/24 2135 8     Pain Loc --      Pain Education --      Exclude from Growth Chart --     Most recent vital signs: Vitals:   02/07/24 2200 02/07/24 2230  BP: 102/68 119/60  Pulse: 70 67  Resp: 11 16  Temp:    SpO2: 96% 99%    General: Awake, no distress.  CV:  Good peripheral perfusion.  Resp:  Normal effort.  Abd:  No distention.  Soft and nontender anteriorly MSK:  No deformity noted.  Neuro:  No focal deficits appreciated. Other:  Chaperoned anorectal exam with significant impacted hard stool in the rectal vault   ED Results / Procedures / Treatments   Labs (all labs ordered are listed, but only abnormal results are displayed) Labs Reviewed - No  data to display  EKG   RADIOLOGY Plain film of the abdomen with moderate stool burden without obstructive features or free air  Official radiology report(s): DG Abdomen 1 View Result Date: 02/07/2024 CLINICAL DATA:  History of constipation for 1 week EXAM: ABDOMEN - 1 VIEW COMPARISON:  None Available. FINDINGS: Scattered large and small bowel gas is noted. Some retained fecal material is noted consistent with a moderate degree of constipation. This is prominent in the right colon as well as in the rectal vault. Changes of prior aortic stent graft therapy are seen. No abnormal mass or abnormal calcifications are seen. Degenerative changes of lumbar spine are noted. IMPRESSION: Moderate colonic constipation. Electronically Signed   By: Oneil Devonshire M.D.   On: 02/07/2024 22:57    PROCEDURES and INTERVENTIONS:  .Fecal disimpaction  Date/Time: 02/07/2024 11:43 PM  Performed by: Claudene Rover, MD Authorized by: Claudene Rover, MD  Consent: Verbal consent obtained Risks and benefits: risks, benefits and alternatives were discussed Consent given by: patient Required items: required blood products, implants, devices,  and special equipment available Patient tolerance: patient tolerated the procedure well with no immediate complications Comments: Chaperoned by nursing staff, lubricated DRE with large volume impacted hard stool within the rectum.  Brown in color without bleeding.  No fissures or external hemorrhoids.  Disimpacted successfully with significant improvement of symptoms     Medications  polyethylene glycol (MIRALAX / GLYCOLAX) packet 34 g (34 g Oral Given 02/07/24 2318)     IMPRESSION / MDM / ASSESSMENT AND PLAN / ED COURSE  I reviewed the triage vital signs and the nursing notes.  Differential diagnosis includes, but is not limited to, constipation, SBO, impacted stool  {Patient presents with symptoms of an acute illness or injury that is potentially  life-threatening.  Patient presents with constipation and stool impaction requiring manual disimpaction.  This resolved his symptoms and he is feeling much better, anterior abdomen is benign and KUB without obstructive features.  No indications at this point for additional diagnostics.  Discussed hydration, MiraLAX and patient is suitable for outpatient management  Clinical Course as of 02/07/24 2343  Tue Feb 07, 2024  2315 Reassessed.  [DS]    Clinical Course User Index [DS] Claudene Rover, MD     FINAL CLINICAL IMPRESSION(S) / ED DIAGNOSES   Final diagnoses:  Constipation, unspecified constipation type  Impacted stool in rectum [facility])     Rx / DC Orders   ED Discharge Orders     None        Note:  This document was prepared using Dragon voice recognition software and may include unintentional dictation errors.   Claudene Rover, MD 02/07/24 [phone]

## 2024-02-07 NOTE — Discharge Instructions (Addendum)
 Plenty of water  Use MiraLAX, or its generic equivalent polyethylene glycol, for your constipation.  Mix this white powder in your favorite noncarbonated drink, such as milk, water or juice.  To begin with, use 2 capfuls up to twice per day.  Once you are passing more regular bowel movements, cut back to about 1 capful once or twice a day.  It is safe to use suppositories or enemas with this medication.  Please stay hydrated and drink plenty of fluids while you are taking this medication.

## 2024-02-07 NOTE — ED Triage Notes (Signed)
 Pt arrived via [facility] from home where he takes care of himself and lives alone. Pt c/o x1 week constipation without relief from @ home interventions or PO OTC meds. Pt with recent hospital admit 01/27/24 for acute encephalopathy/UTI. Pt arrived in depends and shirt only with difficulty standing.

## 2024-02-23 ENCOUNTER — Ambulatory Visit: Admitting: Podiatry

## 2024-02-23 DIAGNOSIS — B351 Tinea unguium: Secondary | ICD-10-CM

## 2024-02-23 DIAGNOSIS — M79674 Pain in right toe(s): Secondary | ICD-10-CM | POA: Diagnosis not present

## 2024-02-23 DIAGNOSIS — M79675 Pain in left toe(s): Secondary | ICD-10-CM | POA: Diagnosis not present

## 2024-02-23 DIAGNOSIS — G6289 Other specified polyneuropathies: Secondary | ICD-10-CM | POA: Diagnosis not present

## 2024-02-23 DIAGNOSIS — Z8042 Family history of malignant neoplasm of prostate: Secondary | ICD-10-CM | POA: Insufficient documentation

## 2024-02-28 ENCOUNTER — Inpatient Hospital Stay: Admission: EM | Admit: 2024-02-28 | Discharge: 2024-03-06 | DRG: 871 | Disposition: A | Discharge status: Home Health

## 2024-02-28 ENCOUNTER — Other Ambulatory Visit: Payer: Self-pay

## 2024-02-28 ENCOUNTER — Emergency Department

## 2024-02-28 DIAGNOSIS — E039 Hypothyroidism, unspecified: Secondary | ICD-10-CM | POA: Diagnosis present

## 2024-02-28 DIAGNOSIS — I110 Hypertensive heart disease with heart failure: Secondary | ICD-10-CM | POA: Diagnosis present

## 2024-02-28 DIAGNOSIS — E875 Hyperkalemia: Secondary | ICD-10-CM | POA: Diagnosis present

## 2024-02-28 DIAGNOSIS — A419 Sepsis, unspecified organism: Secondary | ICD-10-CM | POA: Diagnosis present

## 2024-02-28 DIAGNOSIS — Z79899 Other long term (current) drug therapy: Secondary | ICD-10-CM

## 2024-02-28 DIAGNOSIS — Z1612 Extended spectrum beta lactamase (ESBL) resistance: Secondary | ICD-10-CM | POA: Diagnosis present

## 2024-02-28 DIAGNOSIS — Z8546 Personal history of malignant neoplasm of prostate: Secondary | ICD-10-CM | POA: Diagnosis not present

## 2024-02-28 DIAGNOSIS — E440 Moderate protein-calorie malnutrition: Secondary | ICD-10-CM | POA: Diagnosis present

## 2024-02-28 DIAGNOSIS — Z6827 Body mass index (BMI) 27.0-27.9, adult: Secondary | ICD-10-CM

## 2024-02-28 DIAGNOSIS — N390 Urinary tract infection, site not specified: Secondary | ICD-10-CM | POA: Diagnosis present

## 2024-02-28 DIAGNOSIS — Z87440 Personal history of urinary (tract) infections: Secondary | ICD-10-CM | POA: Diagnosis not present

## 2024-02-28 DIAGNOSIS — N321 Vesicointestinal fistula: Secondary | ICD-10-CM

## 2024-02-28 DIAGNOSIS — Z8673 Personal history of transient ischemic attack (TIA), and cerebral infarction without residual deficits: Secondary | ICD-10-CM | POA: Diagnosis not present

## 2024-02-28 DIAGNOSIS — Z86711 Personal history of pulmonary embolism: Secondary | ICD-10-CM | POA: Diagnosis not present

## 2024-02-28 DIAGNOSIS — Z79891 Long term (current) use of opiate analgesic: Secondary | ICD-10-CM | POA: Diagnosis not present

## 2024-02-28 DIAGNOSIS — Z79890 Hormone replacement therapy (postmenopausal): Secondary | ICD-10-CM | POA: Diagnosis not present

## 2024-02-28 DIAGNOSIS — K5903 Drug induced constipation: Secondary | ICD-10-CM | POA: Diagnosis present

## 2024-02-28 DIAGNOSIS — I5032 Chronic diastolic (congestive) heart failure: Secondary | ICD-10-CM | POA: Diagnosis present

## 2024-02-28 DIAGNOSIS — I2510 Atherosclerotic heart disease of native coronary artery without angina pectoris: Secondary | ICD-10-CM | POA: Diagnosis present

## 2024-02-28 DIAGNOSIS — A4151 Sepsis due to Escherichia coli [E. coli]: Principal | ICD-10-CM | POA: Diagnosis present

## 2024-02-28 DIAGNOSIS — G8929 Other chronic pain: Secondary | ICD-10-CM | POA: Diagnosis present

## 2024-02-28 DIAGNOSIS — E785 Hyperlipidemia, unspecified: Secondary | ICD-10-CM | POA: Diagnosis present

## 2024-02-28 DIAGNOSIS — Z888 Allergy status to other drugs, medicaments and biological substances status: Secondary | ICD-10-CM

## 2024-02-28 DIAGNOSIS — G9341 Metabolic encephalopathy: Secondary | ICD-10-CM

## 2024-02-28 DIAGNOSIS — Z8679 Personal history of other diseases of the circulatory system: Secondary | ICD-10-CM

## 2024-02-28 DIAGNOSIS — Z8619 Personal history of other infectious and parasitic diseases: Secondary | ICD-10-CM

## 2024-02-28 DIAGNOSIS — G928 Other toxic encephalopathy: Secondary | ICD-10-CM | POA: Diagnosis present

## 2024-02-28 DIAGNOSIS — R319 Hematuria, unspecified: Secondary | ICD-10-CM | POA: Diagnosis present

## 2024-02-28 DIAGNOSIS — D539 Nutritional anemia, unspecified: Secondary | ICD-10-CM | POA: Diagnosis present

## 2024-02-28 DIAGNOSIS — Z800 Family history of malignant neoplasm of digestive organs: Secondary | ICD-10-CM

## 2024-02-28 DIAGNOSIS — I739 Peripheral vascular disease, unspecified: Secondary | ICD-10-CM | POA: Diagnosis present

## 2024-02-28 DIAGNOSIS — R739 Hyperglycemia, unspecified: Secondary | ICD-10-CM | POA: Diagnosis present

## 2024-02-28 DIAGNOSIS — R6521 Severe sepsis with septic shock: Secondary | ICD-10-CM | POA: Diagnosis present

## 2024-02-28 DIAGNOSIS — E872 Acidosis, unspecified: Secondary | ICD-10-CM | POA: Diagnosis present

## 2024-02-28 DIAGNOSIS — K219 Gastro-esophageal reflux disease without esophagitis: Secondary | ICD-10-CM | POA: Diagnosis present

## 2024-02-28 DIAGNOSIS — E8729 Other acidosis: Secondary | ICD-10-CM | POA: Diagnosis not present

## 2024-02-28 DIAGNOSIS — Z87891 Personal history of nicotine dependence: Secondary | ICD-10-CM

## 2024-02-28 DIAGNOSIS — T402X5A Adverse effect of other opioids, initial encounter: Secondary | ICD-10-CM | POA: Diagnosis present

## 2024-02-28 DIAGNOSIS — J441 Chronic obstructive pulmonary disease with (acute) exacerbation: Secondary | ICD-10-CM | POA: Diagnosis not present

## 2024-02-28 DIAGNOSIS — Z96653 Presence of artificial knee joint, bilateral: Secondary | ICD-10-CM | POA: Diagnosis present

## 2024-02-28 DIAGNOSIS — R578 Other shock: Secondary | ICD-10-CM | POA: Diagnosis not present

## 2024-02-28 DIAGNOSIS — G4733 Obstructive sleep apnea (adult) (pediatric): Secondary | ICD-10-CM | POA: Diagnosis present

## 2024-02-28 DIAGNOSIS — I959 Hypotension, unspecified: Principal | ICD-10-CM

## 2024-02-28 LAB — CBC WITH DIFFERENTIAL/PLATELET
Abs Immature Granulocytes: 0.1 K/uL — ABNORMAL HIGH (ref 0.00–0.07)
Basophils Absolute: 0 K/uL (ref 0.0–0.1)
Basophils Relative: 0 %
Eosinophils Absolute: 0 K/uL (ref 0.0–0.5)
Eosinophils Relative: 0 %
HCT: 34.1 % — ABNORMAL LOW (ref 39.0–52.0)
Hemoglobin: 10.7 g/dL — ABNORMAL LOW (ref 13.0–17.0)
Immature Granulocytes: 1 %
Lymphocytes Relative: 13 %
Lymphs Abs: 1.2 K/uL (ref 0.7–4.0)
MCH: 32.7 pg (ref 26.0–34.0)
MCHC: 31.4 g/dL (ref 30.0–36.0)
MCV: 104.3 fL — ABNORMAL HIGH (ref 80.0–100.0)
Monocytes Absolute: 0.8 K/uL (ref 0.1–1.0)
Monocytes Relative: 9 %
Neutro Abs: 6.9 K/uL (ref 1.7–7.7)
Neutrophils Relative %: 77 %
Platelets: 173 K/uL (ref 150–400)
RBC: 3.27 MIL/uL — ABNORMAL LOW (ref 4.22–5.81)
RDW: 14.4 % (ref 11.5–15.5)
WBC: 9 K/uL (ref 4.0–10.5)
nRBC: 0 % (ref 0.0–0.2)

## 2024-02-28 LAB — URINALYSIS, W/ REFLEX TO CULTURE (INFECTION SUSPECTED)
Bilirubin Urine: NEGATIVE
Glucose, UA: 50 mg/dL — AB
Hgb urine dipstick: NEGATIVE
Ketones, ur: 5 mg/dL — AB
Nitrite: NEGATIVE
Protein, ur: 100 mg/dL — AB
Specific Gravity, Urine: 1.023 (ref 1.005–1.030)
WBC, UA: >50 WBC/hpf (ref 0–5)
pH: 5 (ref 5.0–8.0)

## 2024-02-28 LAB — PROTIME-INR
INR: 1 (ref 0.8–1.2)
Prothrombin Time: 14 s (ref 11.4–15.2)

## 2024-02-28 LAB — COMPREHENSIVE METABOLIC PANEL WITH GFR
ALT: 18 U/L (ref 0–44)
AST: 21 U/L (ref 15–41)
Albumin: 3.7 g/dL (ref 3.5–5.0)
Alkaline Phosphatase: 86 U/L (ref 38–126)
Anion gap: 7 (ref 5–15)
BUN: 30 mg/dL — ABNORMAL HIGH (ref 8–23)
CO2: 26 mmol/L (ref 22–32)
Calcium: 10 mg/dL (ref 8.9–10.3)
Chloride: 109 mmol/L (ref 98–111)
Creatinine, Ser: 0.97 mg/dL (ref 0.61–1.24)
GFR, Estimated: >60 mL/min (ref >60)
Glucose, Bld: 130 mg/dL — ABNORMAL HIGH (ref 70–99)
Potassium: 5.9 mmol/L — ABNORMAL HIGH (ref 3.5–5.1)
Sodium: 142 mmol/L (ref 135–145)
Total Bilirubin: 0.4 mg/dL (ref 0.0–1.2)
Total Protein: 6.6 g/dL (ref 6.5–8.1)

## 2024-02-28 LAB — TROPONIN T, HIGH SENSITIVITY
Troponin T High Sensitivity: 45 ng/L — ABNORMAL HIGH (ref 0–19)
Troponin T High Sensitivity: 36 ng/L — ABNORMAL HIGH (ref 0–19)

## 2024-02-28 LAB — MRSA NEXT GEN BY PCR, NASAL: MRSA by PCR Next Gen: NOT DETECTED

## 2024-02-28 LAB — LACTIC ACID, PLASMA: Lactic Acid, Venous: 1.5 mmol/L (ref 0.5–1.9)

## 2024-02-28 LAB — MAGNESIUM: Magnesium: 1.7 mg/dL (ref 1.7–2.4)

## 2024-02-28 LAB — PRO BRAIN NATRIURETIC PEPTIDE: Pro Brain Natriuretic Peptide: 352 pg/mL — ABNORMAL HIGH (ref <300.0)

## 2024-02-28 LAB — CBG MONITORING, ED: Glucose-Capillary: 82 mg/dL (ref 70–99)

## 2024-02-28 LAB — PHOSPHORUS: Phosphorus: 2.6 mg/dL (ref 2.5–4.6)

## 2024-02-28 MED ORDER — SODIUM CHLORIDE 0.9 % IV BOLUS
500.0000 mL | Freq: Once | INTRAVENOUS | Status: AC
  Administered 2024-02-28: 500 mL via INTRAVENOUS

## 2024-02-28 MED ORDER — CALCIUM GLUCONATE-NACL 1-0.675 GM/50ML-% IV SOLN
1.0000 g | Freq: Once | INTRAVENOUS | Status: AC
  Administered 2024-02-28: 1000 mg via INTRAVENOUS
  Filled 2024-02-28: qty 50

## 2024-02-28 MED ORDER — NOREPINEPHRINE 4 MG/250ML-% IV SOLN
0.0000 ug/min | INTRAVENOUS | Status: DC
  Administered 2024-02-28: 5 ug/min via INTRAVENOUS
  Administered 2024-02-29: 2 ug/min via INTRAVENOUS
  Administered 2024-02-29: 5 ug/min via INTRAVENOUS
  Filled 2024-02-28 (×2): qty 250

## 2024-02-28 MED ORDER — DEXTROSE 50 % IV SOLN
1.0000 | Freq: Once | INTRAVENOUS | Status: AC
  Administered 2024-02-28: 50 mL via INTRAVENOUS
  Filled 2024-02-28: qty 50

## 2024-02-28 MED ORDER — INSULIN ASPART 100 UNIT/ML IV SOLN
10.0000 [IU] | Freq: Once | INTRAVENOUS | Status: AC
  Administered 2024-02-28: 10 [IU] via INTRAVENOUS
  Filled 2024-02-28: qty 0.1
  Filled 2024-02-28: qty 10

## 2024-02-28 MED ORDER — SODIUM ZIRCONIUM CYCLOSILICATE 10 G PO PACK
10.0000 g | PACK | Freq: Once | ORAL | Status: AC
  Administered 2024-02-28: 10 g via ORAL
  Filled 2024-02-28: qty 1

## 2024-02-28 MED ORDER — SODIUM CHLORIDE 0.9 % IV BOLUS
1000.0000 mL | Freq: Once | INTRAVENOUS | Status: AC
  Administered 2024-02-28: 1000 mL via INTRAVENOUS

## 2024-02-28 MED ORDER — DOCUSATE SODIUM 100 MG PO CAPS
100.0000 mg | ORAL_CAPSULE | Freq: Two times a day (BID) | ORAL | Status: DC | PRN
Start: 2024-02-28 — End: 2024-03-06

## 2024-02-28 MED ORDER — ALBUTEROL SULFATE (2.5 MG/3ML) 0.083% IN NEBU
INHALATION_SOLUTION | RESPIRATORY_TRACT | Status: AC
  Filled 2024-02-28: qty 12

## 2024-02-28 MED ORDER — SODIUM CHLORIDE 0.9 % IV SOLN
1.0000 g | Freq: Once | INTRAVENOUS | Status: AC
  Administered 2024-02-28: 1 g via INTRAVENOUS
  Filled 2024-02-28: qty 20

## 2024-02-28 MED ORDER — POLYETHYLENE GLYCOL 3350 17 G PO PACK
17.0000 g | PACK | Freq: Every day | ORAL | Status: DC | PRN
Start: 2024-02-28 — End: 2024-03-02
  Administered 2024-03-02: 17 g via ORAL
  Filled 2024-02-28: qty 1

## 2024-02-28 MED ORDER — ALBUTEROL SULFATE (2.5 MG/3ML) 0.083% IN NEBU
10.0000 mg | INHALATION_SOLUTION | Freq: Once | RESPIRATORY_TRACT | Status: AC
  Administered 2024-02-28: 10 mg via RESPIRATORY_TRACT
  Filled 2024-02-28: qty 12

## 2024-02-28 MED ORDER — CHLORHEXIDINE GLUCONATE CLOTH 2 % EX PADS
6.0000 | MEDICATED_PAD | Freq: Every day | CUTANEOUS | Status: DC
  Administered 2024-02-28 – 2024-03-01 (×3): 6 via TOPICAL
  Filled 2024-02-28: qty 6

## 2024-02-28 MED ORDER — HEPARIN SODIUM (PORCINE) 5000 UNIT/ML IJ SOLN
5000.0000 [IU] | Freq: Three times a day (TID) | INTRAMUSCULAR | Status: DC
  Administered 2024-02-28 – 2024-03-02 (×8): 5000 [IU] via SUBCUTANEOUS
  Filled 2024-02-28 (×9): qty 1

## 2024-02-28 NOTE — ED Provider Notes (Signed)
 [facility] Provider Note    Event Date/Time   First MD Initiated Contact with Patient 02/28/24 1504     (approximate)   History   Hypotension and Altered Mental Status  Per EMS:  Call received from in home nurse aid that patient was hypotensive, altered, and has a possible UTI. Pt normally A/ox4 per EMS   HPI [employer]. is a 89 y.o. 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  Denies any specific symptoms on lying down. - Per EMS, home nurse aide found patient to be hypotensive and altered.  Also expressed concern for UTI.  Per chart review, last admitted 10/10-10/13/2025 for complicated UTI and metabolic encephalopathy/delirium.  Has had ESBL UTIs in the past.     Physical Exam   Triage Vital Signs: ED Triage Vitals  Encounter Vitals Group     BP 02/28/24 1500 (!) 99/52     Girls Systolic BP Percentile --      Girls Diastolic BP Percentile --      Boys Systolic BP Percentile --      Boys Diastolic BP Percentile --      Pulse Rate 02/28/24 1500 76     Resp 02/28/24 1500 19     Temp 02/28/24 1501 98 F (36.7 C)     Temp Source 02/28/24 1501 Oral     SpO2 02/28/24 1500 98 %     Weight 02/28/24 1503 160 lb (72.6 kg)     Height --      Head Circumference --      Peak Flow --      Pain Score --      Pain Loc --      Pain Education --      Exclude from Growth Chart --     Most recent vital signs: Vitals:   02/28/24 2005 02/28/24 2015  BP: (!) 95/48 (!) 115/50  Pulse: 86 (!) 118  Resp: 17 20  Temp:    SpO2: 98%      General: Awake, no distress.  CV:  Good peripheral  perfusion. RRR, RP 2+ Resp:  Normal effort. CTAB Abd:  No distention. Nontender to deep palpation throughout Other:  No rashes on visible skin, no meningismus, moving all extremities spontaneously, no focal motor deficit appreciated, AO x 3 though somewhat confused on date   ED Results / Procedures / Treatments   Labs (all labs ordered are listed, but only abnormal results are displayed) Labs Reviewed  COMPREHENSIVE METABOLIC PANEL WITH GFR - Abnormal; Notable for the following components:      Result Value   Potassium 5.9 (*)    Glucose, Bld 130 (*)    BUN 30 (*)    All other components within normal limits  CBC WITH DIFFERENTIAL/PLATELET - Abnormal; Notable for the following components:   RBC 3.27 (*)    Hemoglobin 10.7 (*)    HCT 34.1 (*)    MCV 104.3 (*)    Abs Immature Granulocytes 0.10 (*)    All other components within normal limits  URINALYSIS, W/ REFLEX TO CULTURE (INFECTION SUSPECTED) - Abnormal; Notable for the following components:   Color, Urine YELLOW (*)    APPearance CLOUDY (*)  Glucose, UA 50 (*)    Ketones, ur 5 (*)    Protein, ur 100 (*)    Leukocytes,Ua LARGE (*)    Bacteria, UA MANY (*)    All other components within normal limits  PRO BRAIN NATRIURETIC PEPTIDE - Abnormal; Notable for the following components:   Pro Brain Natriuretic Peptide 352.0 (*)    All other components within normal limits  TROPONIN T, HIGH SENSITIVITY - Abnormal; Notable for the following components:   Troponin T High Sensitivity 45 (*)    All other components within normal limits  CULTURE, BLOOD (ROUTINE X 2)  CULTURE, BLOOD (ROUTINE X 2)  URINE CULTURE  MRSA NEXT GEN BY PCR, NASAL  LACTIC ACID, PLASMA  PROTIME-INR  LACTIC ACID, PLASMA  BASIC METABOLIC PANEL WITH GFR  CBC  BASIC METABOLIC PANEL WITH GFR  MAGNESIUM  PHOSPHORUS  CBG MONITORING, ED  TROPONIN T, HIGH SENSITIVITY     EKG  Ecg = sinus rhythm, rate 73, no gross ST elevation or depression, no  significant repolarization abnormality, left axis deviation, normal intervals except for first-degree AV block.  QTc normal, no peaked T waves.  No evidence of ischemia, arrhythmia, hyperkalemia.   RADIOLOGY Radiology interpreted by myself radiology report reviewed.  No acute pathology identified.    PROCEDURES:  Critical Care performed: Yes, see critical care procedure note(s)  .Critical Care  Performed by: Clarine Ozell LABOR, MD Authorized by: Clarine Ozell LABOR, MD   Critical care provider statement:    Critical care time (minutes):  30   Critical care time was exclusive of:  Separately billable procedures and treating other patients   Critical care was necessary to treat or prevent imminent or life-threatening deterioration of the following conditions:  Metabolic crisis and circulatory failure   Critical care was time spent personally by me on the following activities:  Development of treatment plan with patient or surrogate, discussions with consultants, evaluation of patient's response to treatment, examination of patient, ordering and review of laboratory studies, ordering and review of radiographic studies, ordering and performing treatments and interventions, pulse oximetry, re-evaluation of patient's condition and review of old charts   I assumed direction of critical care for this patient from another provider in my specialty: no     Care discussed with: admitting provider      MEDICATIONS ORDERED IN ED: Medications  norepinephrine (LEVOPHED) 4mg  in (0.016 mg/mL) premix infusion (4 mcg/min Intravenous Rate/Dose Change 02/28/24 2005)  Chlorhexidine Gluconate Cloth 2 % PADS 6 each (has no administration in time range)  docusate sodium (COLACE) capsule 100 mg (has no administration in time range)  polyethylene glycol (MIRALAX / GLYCOLAX) packet 17 g (has no administration in time range)  heparin injection 5,000 Units (has no administration in time range)  sodium chloride  0.9  % bolus 1,000 mL (has no administration in time range)  sodium chloride  0.9 % bolus 1,000 mL (0 mLs Intravenous Stopped 02/28/24 1729)  sodium zirconium cyclosilicate (LOKELMA) packet 10 g (10 g Oral Given 02/28/24 1726)  insulin aspart (novoLOG) injection 10 Units (10 Units Intravenous Given 02/28/24 1659)    And  dextrose 50 % solution 50 mL (50 mLs Intravenous Given 02/28/24 1659)  calcium gluconate 1 g/ 50 mL sodium chloride  IVPB (0 mg Intravenous Stopped 02/28/24 1729)  albuterol (PROVENTIL) (2.5 MG/3ML) 0.083% nebulizer solution 10 mg (10 mg Nebulization Given 02/28/24 1744)  sodium chloride  0.9 % bolus 500 mL (0 mLs Intravenous Stopped 02/28/24 2000)  meropenem (MERREM) 1 g  in sodium chloride  0.9 % 100 mL IVPB (0 g Intravenous Stopped 02/28/24 1911)  sodium chloride  0.9 % bolus 500 mL (500 mLs Intravenous New Bag/Given 02/28/24 1959)     IMPRESSION / MDM / ASSESSMENT AND PLAN / ED COURSE  I reviewed the triage vital signs and the nursing notes.                              DDX/MDM/AP: Differential diagnosis includes, but is not limited to, recurrent underlying UTI, consider possibility of systemic infection given hypotension.  Consider anemia.  No history to suggest medication overdose, appears to only be on losartan for hypertension.  Considered but doubt ACS.  Doubt underlying pneumonia, do not clinically suspect intra-abdominal pathology given very benign abdominal exam here.  Plan: - Labs - IV fluid - Chest x-ray EKG - Reassess  Patient's presentation is most consistent with acute presentation with potential threat to life or bodily function.  The patient is on the cardiac monitor to evaluate for evidence of arrhythmia and/or significant heart rate changes.  ED course below.  Workup with hyperkalemia to 5.9, no AKI, no EKG changes.  Treated with insulin, dextrose, Lokelma, albuterol.  Received about 2 L IV fluid but remained hypotensive here in emergency department  requiring vasopressor support, good response with this.  Based on prior UTI culture data, treating with meropenem.  Mild troponin elevation, EKG nonischemic, suspect mild type II NSTEMI in the setting of hypotension.  Admitted to hospitalist service.  Clinical Course as of 02/28/24 2052  Tue Feb 28, 2024  1614 CBC with no leukocytosis, improved anemia [MM]  1643 CMP with hyperkalemia to 5.9, otherwise unremarkable  Getting IV fluid  Lactate wnl [MM]  1817 UA c/w infxn, will treat [MM]  2021 Patient remains hypotensive despite about 2 L of IV fluid here  MAP improved on Levophed  [MM]  2022 Paging ICU [MM]    Clinical Course User Index [MM] Clarine Ozell LABOR, MD     FINAL CLINICAL IMPRESSION(S) / ED DIAGNOSES   Final diagnoses:  Hypotension, unspecified hypotension type  Urinary tract infection with hematuria, site unspecified  Hyperkalemia     Rx / DC Orders   ED Discharge Orders     None        Note:  This document was prepared using Dragon voice recognition software and may include unintentional dictation errors.   Clarine Ozell LABOR, MD 02/28/24 2052

## 2024-02-28 NOTE — ED Notes (Signed)
 Called lab to add on troponin. Per lab they will add it on

## 2024-02-28 NOTE — ED Notes (Signed)
 Attempted to call report to ICU. RN not available at this time.

## 2024-02-28 NOTE — H&P (Signed)
 NAME:  Bryan Bradish., MRN:  [id number], DOB:  1932-07-27, LOS: 0 ADMISSION DATE:  02/28/2024 CONSULTATION DATE:  02/28/24 REFERRING MD:  Dr. Clarine, CHIEF COMPLAINT: Altered mental status and hypotension Brief summary: This is an 89 year old male who presented to the ED via EMS with complaints of hypotension, altered mental status and possible UTI.  He was ruled in for a UTI and urosepsis.  He failed fluid resuscitation and required pressors hence PCCM was called to admit.  HPI: This is an 89 year old Caucasian male with a past medical history of recurrent UTIs, AAA, CAD, diverticulosis, esophageal stricture, GERD, hyperlipidemia, hyperthyroidism and hypothyroidism who presented to the ED via EMS for complaints of increased confusion, low blood pressure and possible UTI.  History is obtained from patient, and patient's records.  He is pleasantly confused.  He states that he was doing fine when he was brought to the emergency room.  Per ED records, patient's caregiver called EMS because he was hypotensive and had changes in mental status and they suspected he might have a UTI.  His ED workup revealed a potassium of 5.9, glucose of 130, BUN of 30, troponin of 4.5, hemoglobin of 10.7, hematocrit of 34.1, and his urine was cloudy with large amounts of leukocytes. He was ruled in for urosepsis and started on IV fluids and empiric antibiotics.  Despite fluid resuscitation patient remained in refractory shock hence requiring vasopressors.  PCCM was called to admit and continue patient's management. He remains on pressors though his requirements are improving with IV fluids.  He is awake and offers no complaints.  He is pleasantly confused and denies pain, shortness of breath dizziness and headache. Has a history of frequent UTIs and his last urinalysis showed ESBL in urine.  He is being covered empirically for ESBL UTI  Past Medical History:  Diagnosis Date   Abdominal aortic aneurysm    Coronary artery  disease    Diverticulosis of colon    Esophageal stricture    GERD (gastroesophageal reflux disease)    Hemorrhoids, external    Hiatal hernia    Hyperlipidemia    Hypothyroidism    Past Surgical History:  Procedure Laterality Date   ABDOMINAL AORTIC ANEURYSM REPAIR     APPENDECTOMY     EYE SURGERY     bilateral cataract surgery   INGUINAL HERNIA REPAIR     Right inguinal herniography   NECK SURGERY     fuse vertabrae    ROTATOR CUFF REPAIR     left   TOTAL KNEE ARTHROPLASTY     left    TOTAL KNEE ARTHROPLASTY     right   No current facility-administered medications on file prior to encounter.   Current Outpatient Medications on File Prior to Encounter  Medication Sig   acetaminophen (TYLENOL) 650 MG CR tablet Take one tablet every morning and two tablets nightly   Alpha-Lipoic Acid 200 MG CAPS Take 200 mg by mouth daily.   atorvastatin (LIPITOR) 80 MG tablet Take 80 mg by mouth daily.   celecoxib (CELEBREX) 200 MG capsule Take 200 mg by mouth.   Cholecalciferol 25 MCG (1000 UT) tablet Take 1,000 Units by mouth daily.   Coenzyme Q10 100 MG capsule Take 200 mg by mouth.   cyanocobalamin ([employer] VITAMIN B12) 1000 MCG tablet Take 1,000 mcg by mouth daily.    DULoxetine HCl 30 MG CSDR Take 30 mg by mouth daily.   folic acid (FOLVITE) 800 MCG tablet Take 800 mcg by mouth  at bedtime.   gabapentin (NEURONTIN) 300 MG capsule Take 300 mg by mouth at bedtime.   HYDROcodone-acetaminophen (NORCO) 7.5-325 MG tablet Take 1 tablet by mouth 2 (two) times daily as needed for moderate pain (pain score 4-6) or severe pain (pain score 7-10).   levothyroxine (SYNTHROID) 112 MCG tablet Take 112 mcg by mouth daily before breakfast.   losartan (COZAAR) 50 MG tablet Take 50 mg by mouth daily.   omeprazole (PRILOSEC) 20 MG capsule Take 20 mg by mouth 2 (two) times daily as needed.    polyethylene glycol (MIRALAX / GLYCOLAX) 17 g packet Take 17 g by mouth daily as needed for mild constipation or  moderate constipation.   senna (SENOKOT) 8.6 MG tablet Take 1 tablet by mouth 2 (two) times daily.   ascorbic acid (VITAMIN C) 500 MG tablet Take 500 mg by mouth 2 (two) times daily.   furosemide (LASIX) 40 MG tablet Take 40 mg by mouth. (Patient not taking: Reported on 02/28/2024)   linezolid (ZYVOX) 600 MG tablet Take 600 mg by mouth 2 (two) times daily. (Patient not taking: Reported on 02/28/2024)   naloxone (NARCAN) nasal spray 4 mg/0.1 mL Place 1 spray into the nose once as needed.   phenazopyridine (PYRIDIUM) 100 MG tablet Take 100 mg by mouth 3 (three) times daily as needed. (Patient not taking: Reported on 02/28/2024)   sennosides-docusate sodium (SENOKOT-S) 8.6-50 MG tablet Take 1 tablet by mouth daily.    Allergies Allergies  Allergen Reactions   Captopril Cough   Quinolones Other (See Comments)    AAA patient- avoid if possible    Family History Family History  Problem Relation Age of Onset   Colon cancer Cousin        maternal 1st cousin   Heart disease Unknown    Social History  reports that he has quit smoking. He has never used smokeless tobacco. He reports that he does not drink alcohol and does not use drugs.  Review Of Systems:   Review of Systems:   Positives in BOLD:  Gen: Denies fever, chills, weight change, fatigue, night sweats HEENT: Denies blurred vision, double vision, hearing loss, tinnitus, sinus congestion, rhinorrhea, sore throat, neck stiffness, dysphagia PULM: Denies shortness of breath, cough, sputum production, hemoptysis, wheezing CV: Denies chest pain, edema, orthopnea, paroxysmal nocturnal dyspnea, palpitations GI: Denies abdominal pain, nausea, vomiting, diarrhea, hematochezia, melena, constipation, change in bowel habits GU: Denies dysuria, hematuria, polyuria, oliguria, urethral discharge Endocrine: Denies hot or cold intolerance, polyuria, polyphagia or appetite change Derm: Denies rash, dry skin, scaling or peeling skin change Heme:  Denies easy bruising, bleeding, bleeding gums Neuro: Denies headache, numbness, weakness, slurred speech, loss of memory or consciousness   VITAL SIGNS: BP 128/81 (BP Location: Left Arm)   Pulse 63   Temp 97.7 F (36.5 C) (Axillary)   Resp 18   Ht 5' 5 (1.651 m)   Wt 75.1 kg   SpO2 99%   BMI 27.55 kg/m   HEMODYNAMICS:    VENTILATOR SETTINGS:    INTAKE / OUTPUT: I/O last 3 completed shifts: In: 1541.7 [I.V.:500; IV Piggyback:1041.7] Out: -   PHYSICAL EXAMINATION: General: Awake, pleasant, in no acute distress HEENT: PERRLA, trachea midline, no JVD Neuro: Alert and oriented to person and place, moves all extremities, extraocular eye movements intact, pupils are equal and reactive Cardiovascular: Apical pulse regular, S1-S2, no murmur regurg or gallop, +2 pulses bilaterally Lungs: Bilateral breath sounds, no wheezes or rhonchi Abdomen: Distended, normal bowel sounds in all  4 quadrant Musculoskeletal: No joint deformities, positive range of motion Skin: Warm and dry, no lesions  LABS:  BMET Recent Labs  Lab 02/28/24 1527  NA 142  K 5.9*  CL 109  CO2 26  BUN 30*  CREATININE 0.97  GLUCOSE 130*    Electrolytes Recent Labs  Lab 02/28/24 1527 02/28/24 1807  CALCIUM 10.0  --   MG  --  1.7  PHOS  --  2.6    CBC Recent Labs  Lab 02/28/24 1527  WBC 9.0  HGB 10.7*  HCT 34.1*  PLT 173    Coag's Recent Labs  Lab 02/28/24 1527  INR 1.0    Sepsis Markers Recent Labs  Lab 02/28/24 1527  LATICACIDVEN 1.5    ABG No results for input(s): PHART, PCO2ART, PO2ART in the last 168 hours.  Liver Enzymes Recent Labs  Lab 02/28/24 1527  AST 21  ALT 18  ALKPHOS 86  BILITOT 0.4  ALBUMIN 3.7    Cardiac Enzymes Recent Labs  Lab 02/28/24 1807  PROBNP 352.0*    Glucose Recent Labs  Lab 02/28/24 1806  GLUCAP 82    Imaging DG Chest Port 1 View Result Date: 02/28/2024 EXAM: 1 VIEW(S) XRAY OF THE CHEST 02/28/2024 03:19:00 PM  COMPARISON: 01/17/2024 CLINICAL HISTORY: Questionable sepsis - evaluate for abnormality. FINDINGS: LUNGS AND PLEURA: No focal pulmonary opacity. No pulmonary edema. No pleural effusion. No pneumothorax. HEART AND MEDIASTINUM: Stable cardiomediastinal silhouette compared to the prior study of 01/17/2024. BONES AND SOFT TISSUES: No acute osseous abnormality. IMPRESSION: 1. No acute cardiopulmonary process. Electronically signed by: Lynwood Seip MD 02/28/2024 03:25 PM EST RP Workstation: [id number]   STUDIES:  2D echo  CULTURES: Blood cultures x 2 Urine cultures  ANTIBIOTICS: Meropenem  SIGNIFICANT EVENTS: 02/28/2024: Admitted  LINES/TUBES: Peripheral IVs  DISCUSSION: 89 year old male with a history of frequent UTIs presenting with urosepsis and refractory septic shock requiring pressors.  ASSESSMENT / PLAN: CARDIOVASCULAR A:  Septic shock Lactic acidosis-lactic acid trending up from 1.5 to 3.9 History of CAD History of hypertension CAD history of hyperlipidemia P:  -Hemodynamic monitoring per ICU protocol - IV fluids and pressors to maintain mean arterial blood pressure greater than 65 - Wean pressors as tolerated  GASTROINTESTINAL A:   FEN P:   -Regular diet as tolerated -Maintain on bowel regimen to prevent constipation HEMATOLOGIC A:   Chronic anemia-hemoglobin 10.7 and hematocrit 34.1 P:  Trend hemoglobin and hematocrit and transfuse as needed  INFECTIOUS A:   UTI-baseline history of ESBL UTI and frequent UTIs last cystoscopy done at [facility] did not show any elevated ends of colovesical or rectovesical fistula History of hematuria P:   -Follow-up cultures - Continue empiric antibiotics - Monitor fever curve and CBC  ENDOCRINE A:   Mild hyperglycemia without a diagnosis of diabetes P:   -Monitor blood glucose and if consistently greater than 150 mg/dL start sliding scale insulin coverage  NEUROLOGIC A:   Acute metabolic encephalopathy secondary to UTI and  sepsis P:   -Treat underlying infection - Monitor mental status and initiate ICU delirium prevention measures  Best Practice: Diet/type: Regular diet VTE prophylaxis:  SCD's /SQ heparin  GI prophylaxis: Not indicated Lines: Peripheral IVs Foley: None Code Status: Full code Last date of multidisciplinary goals of care discussion -no family at bedside  Senya Hinzman S. Griffin, PhD, MSN, MPH, ANP-BC [facility] Prefer epic messenger for cross cover needs If after hours, please call E-link  NB: This document was prepared using Dragon  voice recognition software and may include unintentional dictation errors.      02/28/2024, 11:01 PM

## 2024-02-28 NOTE — ED Notes (Signed)
 CCMD called and verified patient on cardiac telemetry

## 2024-02-28 NOTE — ED Triage Notes (Signed)
 Per EMS:  Call received from in home nurse aid that patient was hypotensive, altered, and has a possible UTI. Pt normally A/ox4 per EMS

## 2024-02-28 NOTE — Telephone Encounter (Signed)
 Provider FYI ONLY    Reason for call: [facility] RN , Beverley in the home with the pateint now   is calling for:  Chief Complaint  Patient presents with  . Skin Problem      Left wrist palm side ,  3.0 x 2.5 x 1.0 CM   , redness of skin  around same , reported as painful , epidermal area (per home health RN )  1st noted today , ( (1) time per week Nurse visit) noted by nurse today 02/28/24   . PCP RICK      Son reports patient  fell onto the floor, while moving from the bed to scooter , on Sunday 02/26/24 patient denies hitting head, denies injuries from the fall , unwitnessed fall             Actions taken during call: Confirmed address for [facility] , caregiver , family report understanding of same    Routed encounter to Providers , RICK to PCP , FYI to provider  appointment scheduled for 02/29/24   Scheduled an Appointment.  Future Appointments     Date/Time Provider Department Center Visit Type   02/29/2024 10:20 AM (Arrive by 10:05 AM) Claudia Charleston, MD [facility] Primary Care [facility] SAME DAY   03/12/2024 12:00 PM Brien Browning, LCSW [facility] WELL HOME VISIT   03/20/2024 10:30 AM (Arrive by 10:15 AM) Mardy Rexene Hun, [facility] VIDEO VISIT RETURN   04/05/2024 9:30 AM [facility] Diagnostic Rad[facility] BARIUM ENEMA COLON   05/03/2024 10:00 AM (Arrive by 9:30 AM) Scales, Carlin Vicenta Raddle., MD [facility] RETURN VISIT   08/17/2024 11:15 AM (Arrive by 11:00 AM) Jarrell Courtland Jansky, MD [facility] [address]nd Floor [address] RETURN VISIT   12/11/2024 12:00 PM (Arrive by 11:45 AM) Coppolino, Lauraine Lacks, PA [facility] INTERNAL REFERRAL       Triage: Triage completed, care advice given per protocol.   Triage:Call back parameters given and caller advised of 24 hour nurse triage. Instructed to  seek immediate medical attention if new symptoms develop, current symptoms worsen or if you become increasingly concerned. Patient verbalized understanding.    Triage:Triage disposition declined, the patient has been triaged and given the most appropriate disposition for their concerns. The patient is declining the advised disposition. The patient has been educated as appropriate on the risks of delaying or forgoing treatment, and states understanding that they accept these risks. All pertinent patient questions and concerns have been addressed    NENA SALMON, RN [facility] Patient Engagement Center  Patient Engagement Center Documentation     Reason for Disposition . Looks infected (fever, spreading redness, pus, or red streak)  Additional Information . Negative: Shock suspected (e.g., cold/pale/clammy skin, too weak to stand) . Negative: Cut on the neck, chest, back, or abdomen that may go deep (e.g., stab wound or other suspicious penetrating injury) . Negative: Major bleeding (actively dripping or spurting) that can't be stopped . Negative: Amputation . Negative: Sounds like a life-threatening emergency to the triager . Negative: Animal bite and broken skin . Negative: Injury is a puncture wound . Negative: Splinter in the skin . Negative: Wound looks infected . Negative: Burn . Negative: High pressure injection injury (e.g., from paint gun, usually work-related) . Negative: Skin loss involves more than 10% of surface area (Note: the palm of the hand =  1%) . Negative: Skin is split open or gaping (length > 1/2 inch or 12 mm on the skin, 1/4 inch or 6 mm on the face) . Negative: Bleeding won't stop after 10 minutes of direct pressure (using correct technique) . Negative: Cut or scrape is very deep (e.g., can see bone or tendons) . Negative: Dirt in the wound and not removed after 15 minutes of scrubbing . Negative: Wound causes numbness (i.e., loss of sensation) . Negative: Wound  causes weakness (i.e., decreased ability to move hand, finger, toe) . Negative: Sounds like a serious injury to the triager  Protocols used: Skin Injury-A-OH

## 2024-02-28 NOTE — ED Notes (Signed)
 RT made aware patient has the 10mg  hour long albuterol dose.

## 2024-02-28 NOTE — ED Notes (Signed)
 Pt unable to void at this time  report off to [facility].

## 2024-02-29 ENCOUNTER — Inpatient Hospital Stay (HOSPITAL_COMMUNITY)
Admit: 2024-02-29 | Discharge: 2024-02-29 | Disposition: A | Discharge status: Home / Self Care | Attending: Adult Health | Admitting: Adult Health

## 2024-02-29 DIAGNOSIS — R578 Other shock: Secondary | ICD-10-CM | POA: Diagnosis not present

## 2024-02-29 DIAGNOSIS — G928 Other toxic encephalopathy: Secondary | ICD-10-CM

## 2024-02-29 DIAGNOSIS — E039 Hypothyroidism, unspecified: Secondary | ICD-10-CM

## 2024-02-29 DIAGNOSIS — E440 Moderate protein-calorie malnutrition: Secondary | ICD-10-CM | POA: Insufficient documentation

## 2024-02-29 LAB — BLOOD CULTURE ID PANEL (REFLEXED) - BCID2

## 2024-02-29 LAB — BASIC METABOLIC PANEL WITH GFR
Anion gap: 17 — ABNORMAL HIGH (ref 5–15)
Anion gap: 9 (ref 5–15)
BUN: 25 mg/dL — ABNORMAL HIGH (ref 8–23)
BUN: 22 mg/dL (ref 8–23)
CO2: 15 mmol/L — ABNORMAL LOW (ref 22–32)
CO2: 22 mmol/L (ref 22–32)
Calcium: 8.8 mg/dL — ABNORMAL LOW (ref 8.9–10.3)
Calcium: 9 mg/dL (ref 8.9–10.3)
Chloride: 111 mmol/L (ref 98–111)
Chloride: 112 mmol/L — ABNORMAL HIGH (ref 98–111)
Creatinine, Ser: 0.78 mg/dL (ref 0.61–1.24)
Creatinine, Ser: 0.78 mg/dL (ref 0.61–1.24)
GFR, Estimated: >60 mL/min (ref >60)
GFR, Estimated: >60 mL/min (ref >60)
Glucose, Bld: 112 mg/dL — ABNORMAL HIGH (ref 70–99)
Glucose, Bld: 176 mg/dL — ABNORMAL HIGH (ref 70–99)
Potassium: 3.3 mmol/L — ABNORMAL LOW (ref 3.5–5.1)
Potassium: 4.3 mmol/L (ref 3.5–5.1)
Sodium: 143 mmol/L (ref 135–145)
Sodium: 143 mmol/L (ref 135–145)

## 2024-02-29 LAB — LACTIC ACID, PLASMA
Lactic Acid, Venous: 2.7 mmol/L (ref 0.5–1.9)
Lactic Acid, Venous: 3.9 mmol/L (ref 0.5–1.9)
Lactic Acid, Venous: 1.1 mmol/L (ref 0.5–1.9)
Lactic Acid, Venous: 0.9 mmol/L (ref 0.5–1.9)
Lactic Acid, Venous: 1.7 mmol/L (ref 0.5–1.9)
Lactic Acid, Venous: 1.9 mmol/L (ref 0.5–1.9)

## 2024-02-29 LAB — CBC
HCT: 30.9 % — ABNORMAL LOW (ref 39.0–52.0)
Hemoglobin: 9.8 g/dL — ABNORMAL LOW (ref 13.0–17.0)
MCH: 33.1 pg (ref 26.0–34.0)
MCHC: 31.7 g/dL (ref 30.0–36.0)
MCV: 104.4 fL — ABNORMAL HIGH (ref 80.0–100.0)
Platelets: 182 K/uL (ref 150–400)
RBC: 2.96 MIL/uL — ABNORMAL LOW (ref 4.22–5.81)
RDW: 14.5 % (ref 11.5–15.5)
WBC: 11.2 K/uL — ABNORMAL HIGH (ref 4.0–10.5)
nRBC: 0 % (ref 0.0–0.2)

## 2024-02-29 LAB — ECHOCARDIOGRAM COMPLETE
AR max vel: 1.75 cm2
AV Area VTI: 1.72 cm2
AV Area mean vel: 1.66 cm2
AV Mean grad: 19 mmHg
AV Peak grad: 33.5 mmHg
Ao pk vel: 2.9 m/s
Area-P 1/2: 3.12 cm2
Calc EF: 69.2 %
Height: 65 in
MV VTI: 2.29 cm2
S' Lateral: 2.9 cm
Single Plane A2C EF: 59.3 %
Single Plane A4C EF: 71.6 %
Weight: 2649.05 [oz_av]

## 2024-02-29 LAB — PHOSPHORUS: Phosphorus: 2.5 mg/dL (ref 2.5–4.6)

## 2024-02-29 LAB — TROPONIN T, HIGH SENSITIVITY
Troponin T High Sensitivity: 49 ng/L — ABNORMAL HIGH (ref 0–19)
Troponin T High Sensitivity: 61 ng/L — ABNORMAL HIGH (ref 0–19)
Troponin T High Sensitivity: 64 ng/L — ABNORMAL HIGH (ref 0–19)

## 2024-02-29 LAB — GLUCOSE, CAPILLARY: Glucose-Capillary: 109 mg/dL — ABNORMAL HIGH (ref 70–99)

## 2024-02-29 LAB — MAGNESIUM: Magnesium: 1.7 mg/dL (ref 1.7–2.4)

## 2024-02-29 MED ORDER — ORAL CARE MOUTH RINSE: 15.0000 mL | OROMUCOSAL | Status: DC | PRN

## 2024-02-29 MED ORDER — ENSURE PLUS HIGH PROTEIN PO LIQD
237.0000 mL | Freq: Three times a day (TID) | ORAL | Status: DC
  Administered 2024-03-01 – 2024-03-06 (×11): 237 mL via ORAL

## 2024-02-29 MED ORDER — ADULT MULTIVITAMIN W/MINERALS CH
1.0000 | ORAL_TABLET | Freq: Every day | ORAL | Status: DC
  Administered 2024-03-01 – 2024-03-06 (×6): 1 via ORAL
  Filled 2024-02-29 (×6): qty 1

## 2024-02-29 MED ORDER — THIAMINE HCL 100 MG PO TABS
100.0000 mg | ORAL_TABLET | Freq: Every day | ORAL | Status: DC
Start: 2024-03-01 — End: 2024-03-06
  Administered 2024-03-01 – 2024-03-06 (×6): 100 mg via ORAL
  Filled 2024-02-29 (×11): qty 1

## 2024-02-29 MED ORDER — SODIUM CHLORIDE 0.9 % IV SOLN: INTRAVENOUS | Status: DC

## 2024-02-29 MED ORDER — MAGNESIUM SULFATE 2 GM/50ML IV SOLN
2.0000 g | Freq: Once | INTRAVENOUS | Status: AC
  Administered 2024-02-29: 2 g via INTRAVENOUS
  Filled 2024-02-29: qty 50

## 2024-02-29 MED ORDER — PERFLUTREN LIPID MICROSPHERE
1.0000 mL | INTRAVENOUS | Status: AC | PRN
  Administered 2024-02-29: 2 mL via INTRAVENOUS

## 2024-02-29 MED ORDER — ENSURE PLUS HIGH PROTEIN PO LIQD
237.0000 mL | Freq: Two times a day (BID) | ORAL | Status: DC
  Administered 2024-02-29 (×2): 237 mL via ORAL

## 2024-02-29 MED ORDER — SODIUM CHLORIDE 0.9 % IV SOLN
1.0000 g | Freq: Three times a day (TID) | INTRAVENOUS | Status: AC
  Administered 2024-02-29 – 2024-03-01 (×5): 1 g via INTRAVENOUS
  Filled 2024-02-29 (×6): qty 20

## 2024-02-29 MED ORDER — LEVOTHYROXINE SODIUM 112 MCG PO TABS
112.0000 ug | ORAL_TABLET | Freq: Every day | ORAL | Status: DC
  Administered 2024-03-01 – 2024-03-06 (×6): 112 ug via ORAL
  Filled 2024-02-29 (×6): qty 1

## 2024-02-29 MED ORDER — SODIUM CHLORIDE 0.9 % IV SOLN
1.0000 g | Freq: Three times a day (TID) | INTRAVENOUS | Status: DC
  Administered 2024-02-29: 1 g via INTRAVENOUS
  Filled 2024-02-29 (×2): qty 20

## 2024-02-29 NOTE — Progress Notes (Signed)
 NAME:  Bryan Parcell., MRN:  [id number], DOB:  [birth year], LOS: 1 ADMISSION DATE:  02/28/2024, CHIEF COMPLAINT:  Septic Shock   History of Present Illness:   This is an 89 year old Caucasian male with a past medical history of recurrent UTIs, AAA, CAD, diverticulosis, esophageal stricture, GERD, hyperlipidemia, hyperthyroidism and hypothyroidism who presented to the ED via EMS for complaints of increased confusion, low blood pressure and possible UTI.  History is obtained from patient, and patient's records.  He is pleasantly confused.  He states that he was doing fine when he was brought to the emergency room.  Per ED records, patient's caregiver called EMS because he was hypotensive and had changes in mental status and they suspected he might have a UTI.  His ED workup revealed a potassium of 5.9, glucose of 130, BUN of 30, troponin of 4.5, hemoglobin of 10.7, hematocrit of 34.1, and his urine was cloudy with large amounts of leukocytes. He was ruled in for urosepsis and started on IV fluids and empiric antibiotics.  Despite fluid resuscitation patient remained in refractory shock hence requiring vasopressors.  PCCM was called to admit and continue patient's management. He remains on pressors though his requirements are improving with IV fluids.  He is awake and offers no complaints.  He is pleasantly confused and denies pain, shortness of breath dizziness and headache. Has a history of frequent UTIs and his last urinalysis showed ESBL in urine.  He is being covered empirically for ESBL UTI  Pertinent  Medical History  -AAA -CAD -HLD -Chronic Anemia -Recurrent UTI's  Significant Hospital Events: Including procedures, antibiotic start and stop dates in addition to other pertinent events   02/28/2024: admit with sepsis from UTI, on vasopressors  Interim History / Subjective:  Awake, alert, but disoriented. Denies any pain. Trying to get out of bed.  Objective    Blood pressure (!)  112/57, pulse (!) 59, temperature 97.6 F (36.4 C), temperature source Oral, resp. rate 17, height 5' 5 (1.651 m), weight 75.1 kg, SpO2 93%.        Intake/Output Summary (Last 24 hours) at 02/29/2024 1204 Last data filed at 02/29/2024 1111 Gross per 24 hour  Intake 2433.13 ml  Output 350 ml  Net 2083.13 ml   Filed Weights   02/28/24 1503 02/28/24 2228 02/29/24 0500  Weight: 72.6 kg 75.1 kg 75.1 kg    Examination: Physical Exam Constitutional:      General: He is not in acute distress.    Appearance: He is not ill-appearing.  Cardiovascular:     Rate and Rhythm: Normal rate and regular rhythm.     Pulses: Normal pulses.     Heart sounds: Normal heart sounds.  Pulmonary:     Effort: Pulmonary effort is normal.     Breath sounds: Normal breath sounds.  Abdominal:     Palpations: Abdomen is soft.  Neurological:     General: No focal deficit present.     Mental Status: He is alert. He is disoriented.     Assessment and Plan   89 year old male with history of recurrent UTI (previously with ESBL) admitted with septic shock secondary to UTI, on vasopressors.  #Septic Shock #UTI #CAD #Toxic Metabolic Encephalopathy #Agitated Delirium #Hypothyroidism  Neuro - patient with delirium, and is currently confused in the setting of acute infection and encephalopathy. Avoiding nephrotoxic agents as able.  CV - septic shock secondary to UTI, received IV fluids, holding on further resuscitation pending TTE. Lactic acid  normalized, and he is on minimal vasopressor support, titrating down for goal MAP > 65 mmHg.  Pulm - on room air, no acute issues.  GI - NPO for now, SLP consult placed.  Renal - kidney function within normal as are electrolytes.  Endo - resume levothyroxine  Hem/Onc - heparin subQ for DVT prophylaxis  ID - on Meropenem pending urine cultures given history of ESBL Klebsiella. Blood cultures also sent.  Labs   CBC: Recent Labs  Lab 02/28/24 1527  02/29/24 0148  WBC 9.0 11.2*  NEUTROABS 6.9  --   HGB 10.7* 9.8*  HCT 34.1* 30.9*  MCV 104.3* 104.4*  PLT 173 182    Basic Metabolic Panel: Recent Labs  Lab 02/28/24 1527 02/28/24 1807 02/28/24 2040 02/29/24 0148  NA 142  --  143 143  K 5.9*  --  3.3* 4.3  CL 109  --  111 112*  CO2 26  --  15* 22  GLUCOSE 130*  --  112* 176*  BUN 30*  --  25* 22  CREATININE 0.97  --  0.78 0.78  CALCIUM 10.0  --  8.8* 9.0  MG  --  1.7  --  1.7  PHOS  --  2.6  --  2.5   GFR: Estimated Creatinine Clearance: 59.2 mL/min (by C-G formula based on SCr of 0.78 mg/dL). Recent Labs  Lab 02/28/24 1527 02/28/24 2329 02/29/24 0148 02/29/24 0825 02/29/24 1106  WBC 9.0  --  11.2*  --   --   LATICACIDVEN 1.5 2.7* 3.9* 1.1 0.9    Liver Function Tests: Recent Labs  Lab 02/28/24 1527  AST 21  ALT 18  ALKPHOS 86  BILITOT 0.4  PROT 6.6  ALBUMIN 3.7   No results for input(s): LIPASE, AMYLASE in the last 168 hours. No results for input(s): AMMONIA in the last 168 hours.  ABG    Component Value Date/Time   HCO3 32.4 (H) 01/27/2024 1806   O2SAT 31.7 01/27/2024 1806     Coagulation Profile: Recent Labs  Lab 02/28/24 1527  INR 1.0    Cardiac Enzymes: No results for input(s): CKTOTAL, CKMB, CKMBINDEX, TROPONINI in the last 168 hours.  HbA1C: No results found for: HGBA1C  CBG: Recent Labs  Lab 02/28/24 1806 02/28/24 2225  GLUCAP 82 109*    Review of Systems:   N/A  Past Medical History:  He,  has a past medical history of Abdominal aortic aneurysm, Coronary artery disease, Diverticulosis of colon, Esophageal stricture, GERD (gastroesophageal reflux disease), Hemorrhoids, external, Hiatal hernia, Hyperlipidemia, and Hypothyroidism.   Surgical History:   Past Surgical History:  Procedure Laterality Date   ABDOMINAL AORTIC ANEURYSM REPAIR     APPENDECTOMY     EYE SURGERY     bilateral cataract surgery   INGUINAL HERNIA REPAIR     Right inguinal  herniography   NECK SURGERY     fuse vertabrae    ROTATOR CUFF REPAIR     left   TOTAL KNEE ARTHROPLASTY     left    TOTAL KNEE ARTHROPLASTY     right     Social History:   reports that he has quit smoking. He has never used smokeless tobacco. He reports that he does not drink alcohol and does not use drugs.   Family History:  His family history includes Colon cancer in his cousin; Heart disease in his unknown relative.   Allergies Allergies  Allergen Reactions   Captopril Cough   Quinolones Other (See  Comments)    AAA patient- avoid if possible     Home Medications  Prior to Admission medications   Medication Sig Start Date End Date Taking? Authorizing Provider  acetaminophen (TYLENOL) 650 MG CR tablet Take one tablet every morning and two tablets nightly 08/12/22  Yes [provider]  Alpha-Lipoic Acid 200 MG CAPS Take 200 mg by mouth daily.   Yes [provider]  atorvastatin (LIPITOR) 80 MG tablet Take 80 mg by mouth daily. 03/15/22 07/10/24 Yes [provider]  celecoxib (CELEBREX) 200 MG capsule Take 200 mg by mouth. 02/13/24  Yes [provider]  Cholecalciferol 25 MCG (1000 UT) tablet Take 1,000 Units by mouth daily.   Yes [provider]  Coenzyme Q10 100 MG capsule Take 200 mg by mouth.   Yes [provider]  cyanocobalamin ([employer] VITAMIN B12) 1000 MCG tablet Take 1,000 mcg by mouth daily.    Yes [provider]  DULoxetine HCl 30 MG CSDR Take 30 mg by mouth daily.   Yes [provider]  folic acid (FOLVITE) 800 MCG tablet Take 800 mcg by mouth at bedtime. 07/11/23  Yes [provider]  gabapentin (NEURONTIN) 300 MG capsule Take 300 mg by mouth at bedtime.   Yes [provider]  HYDROcodone-acetaminophen (NORCO) 7.5-325 MG tablet Take 1 tablet by mouth 2 (two) times daily as needed for moderate pain (pain score 4-6) or severe pain (pain score 7-10).   Yes [provider]   levothyroxine (SYNTHROID) 112 MCG tablet Take 112 mcg by mouth daily before breakfast. 07/09/22 07/10/24 Yes [provider]  losartan (COZAAR) 50 MG tablet Take 50 mg by mouth daily.   Yes [provider]  omeprazole (PRILOSEC) 20 MG capsule Take 20 mg by mouth 2 (two) times daily as needed.    Yes [provider]  polyethylene glycol (MIRALAX / GLYCOLAX) 17 g packet Take 17 g by mouth daily as needed for mild constipation or moderate constipation. 11/23/22  Yes [provider]  senna (SENOKOT) 8.6 MG tablet Take 1 tablet by mouth 2 (two) times daily. 09/29/23 09/28/24 Yes [provider]  ascorbic acid (VITAMIN C) 500 MG tablet Take 500 mg by mouth 2 (two) times daily.    [provider]  furosemide (LASIX) 40 MG tablet Take 40 mg by mouth. Patient not taking: Reported on 02/28/2024 12/30/21   [provider]  linezolid (ZYVOX) 600 MG tablet Take 600 mg by mouth 2 (two) times daily. Patient not taking: Reported on 02/28/2024 09/03/23   [provider]  naloxone [facility] nasal spray 4 mg/0.1 mL Place 1 spray into the nose once as needed. 09/29/23   [provider]  phenazopyridine (PYRIDIUM) 100 MG tablet Take 100 mg by mouth 3 (three) times daily as needed. Patient not taking: Reported on 02/28/2024 01/02/24   [provider]  sennosides-docusate sodium (SENOKOT-S) 8.6-50 MG tablet Take 1 tablet by mouth daily.    [provider]     The patient is critically ill due to septic shock, UTI, encephalopathy.  Critical care was necessary to treat or prevent imminent or life-threatening deterioration. I personally performed titration, monitoring, and management of vasopressor/ionotrope infusion. Critical care time was spent by me on the following activities: development of a treatment plan with the patient and/or surrogate as well as nursing, discussions with consultants, evaluation of the patient's response to  treatment, examination of the patient, obtaining a history from the patient or surrogate, ordering and  performing treatments and interventions, ordering and review of laboratory studies, ordering and review of radiographic studies, review of telemetry data including pulse oximetry, re-evaluation of patient's condition and participation in multidisciplinary rounds.   I personally spent 36 minutes providing critical care not including any separately billable procedures.   Belva November, MD [facility] 02/29/2024 12:10 PM

## 2024-02-29 NOTE — Plan of Care (Signed)

## 2024-02-29 NOTE — Progress Notes (Signed)
 eLink Physician-Brief Progress Note Patient Name: Bryan Howard. DOB: 01-30-1933 MRN: [id number]   Date of Service  02/29/2024  HPI/Events of Note  Patient admitted with metabolic encephalopathy secondary to suspected UTI with sepsis, work up in progress.  eICU Interventions  New Patient Evaluation.        Eh Sauseda U Demmi Sindt 02/29/2024, 1:04 AM

## 2024-02-29 NOTE — Progress Notes (Signed)
 PHARMACY - PHYSICIAN COMMUNICATION CRITICAL VALUE ALERT - BLOOD CULTURE IDENTIFICATION (BCID)  Bryan Howard. is an 89 y.o. male who presented to [facility] on 02/28/2024 with a chief complaint of confusion  Assessment:  11/11 blood cx with GPC in 1 of 4 bottles, BCID detects Staphylococcus species (NOT S. Aureus). On antibiotics for UTI (had ESBL Klebsiella in urine cx last month)   Name of physician (or Provider) Contacted: Dr Isadora  Current antibiotics: meropenem  Changes to prescribed antibiotics recommended:  Patient is on recommended antibiotics - No changes needed Blood culture likely contaminant. Will monitor on current therapy for UTI   Results for orders placed or performed during the hospital encounter of 02/28/24  Blood Culture ID Panel (Reflexed) (Collected: 02/28/2024  3:27 PM)  Result Value Ref Range   Enterococcus faecalis NOT DETECTED NOT DETECTED   Enterococcus Faecium NOT DETECTED NOT DETECTED   Listeria monocytogenes NOT DETECTED NOT DETECTED   Staphylococcus species DETECTED (A) NOT DETECTED   Staphylococcus aureus (BCID) NOT DETECTED NOT DETECTED   Staphylococcus epidermidis NOT DETECTED NOT DETECTED   Staphylococcus lugdunensis NOT DETECTED NOT DETECTED   Streptococcus species NOT DETECTED NOT DETECTED   Streptococcus agalactiae NOT DETECTED NOT DETECTED   Streptococcus pneumoniae NOT DETECTED NOT DETECTED   Streptococcus pyogenes NOT DETECTED NOT DETECTED   A.calcoaceticus-baumannii NOT DETECTED NOT DETECTED   Bacteroides fragilis NOT DETECTED NOT DETECTED   Enterobacterales NOT DETECTED NOT DETECTED   Enterobacter cloacae complex NOT DETECTED NOT DETECTED   Escherichia coli NOT DETECTED NOT DETECTED   Klebsiella aerogenes NOT DETECTED NOT DETECTED   Klebsiella oxytoca NOT DETECTED NOT DETECTED   Klebsiella pneumoniae NOT DETECTED NOT DETECTED   Proteus species NOT DETECTED NOT DETECTED   Salmonella species NOT DETECTED NOT DETECTED   Serratia  marcescens NOT DETECTED NOT DETECTED   Haemophilus influenzae NOT DETECTED NOT DETECTED   Neisseria meningitidis NOT DETECTED NOT DETECTED   Pseudomonas aeruginosa NOT DETECTED NOT DETECTED   Stenotrophomonas maltophilia NOT DETECTED NOT DETECTED   Candida albicans NOT DETECTED NOT DETECTED   Candida auris NOT DETECTED NOT DETECTED   Candida glabrata NOT DETECTED NOT DETECTED   Candida krusei NOT DETECTED NOT DETECTED   Candida parapsilosis NOT DETECTED NOT DETECTED   Candida tropicalis NOT DETECTED NOT DETECTED   Cryptococcus neoformans/gattii NOT DETECTED NOT DETECTED    Celestine Slovak, PharmD, BCPS, BCIDP Work Cell: [phone] 02/29/2024 10:37 AM

## 2024-02-29 NOTE — Progress Notes (Signed)
 Initial Nutrition Assessment  DOCUMENTATION CODES:   Non-severe (moderate) malnutrition in context of chronic illness  INTERVENTION:   Ensure Plus High Protein po TID, each supplement provides 350 kcal and 20 grams of protein  Magic cup TID with meals, each supplement provides 290 kcal and 9 grams of protein  Liberalize diet   MVI po daily   Pt at high refeed risk; recommend monitor potassium, magnesium and phosphorus labs daily until stable  Daily weights   NUTRITION DIAGNOSIS:   Moderate Malnutrition related to chronic illness as evidenced by moderate fat depletion, moderate muscle depletion, severe muscle depletion, percent weight loss.  GOAL:   Patient will meet greater than or equal to 90% of their needs  MONITOR:   PO intake, Supplement acceptance, Labs, Weight trends, Skin, I & O's  REASON FOR ASSESSMENT:   Malnutrition Screening Tool    ASSESSMENT:   89 y/o male with h/o OSA, PE, HTN, COPD, MDD, hypothyroidism, HLD, esophageal stricure, hiatal hernia, GERD, TIA, AAA s/p repair, CAD and diverticulosis who is admitted with UTI,  urosepsis and shock.  Met with pt in room today. Pt reports decreased appetite and oral intake for 1 week pta. Pt reports that his oral intake is fair in hospital; pt ate ~40% of his breakfast this morning. Pt reports that he has been drinking chocolate Ensure at home and is agreeable to continue this in hospital. RD will add supplements and MVI to help pt meet his estimated needs. RD will also liberalize pt's diet. Pt is at high refeed risk. Pt reports a recent 50lb weight loss. Per chart, pt is down 14lbs(8%) over the past 3 months; this is significant weight loss. Pt does appear to be down ~50lbs over the past 5 years.   Medications reviewed and include: heparin, synthroid, meropenem, levophed   Labs reviewed: K 4.3 wnl, P 2.5 wnl, Mg 1.7 wnl Wbc- 11.2(H), Hgb 9.8(L), Hct 30.9(L) Cbgs- 109, 82 x 48hrs   UOP-   NUTRITION -  FOCUSED PHYSICAL EXAM:  Flowsheet Row Most Recent Value  Orbital Region Mild depletion  Upper Arm Region Moderate depletion  Thoracic and Lumbar Region Moderate depletion  Buccal Region No depletion  Temple Region Severe depletion  Clavicle Bone Region Severe depletion  Clavicle and Acromion Bone Region Severe depletion  Scapular Bone Region Mild depletion  Dorsal Hand Moderate depletion  Patellar Region Moderate depletion  Anterior Thigh Region Severe depletion  Posterior Calf Region Severe depletion  Edema (RD Assessment) None  Hair Reviewed  Eyes Reviewed  Mouth Reviewed  Skin Reviewed  Nails Reviewed   Diet Order:   Diet Order             Diet Heart Room service appropriate? Yes; Fluid consistency: Thin  Diet effective now                  EDUCATION NEEDS:   Education needs have been addressed  Skin:  Skin Assessment: Reviewed RN Assessment (wound L wrist)  Last BM:  pta  Height:   Ht Readings from Last 1 Encounters:  02/28/24 5' 5 (1.651 m)    Weight:   Wt Readings from Last 1 Encounters:  02/29/24 75.1 kg    Ideal Body Weight:  61.8 kg  BMI:  Body mass index is 27.55 kg/m.  Estimated Nutritional Needs:   Kcal:  1800-2100kcal/day  Protein:  90-105g/day  Fluid:  1.7-1.9L/day  Augustin Shams MS, RD, LDN If unable to be reached, please send secure chat  to RD inpatient available from 8:00a-4:00p daily

## 2024-02-29 NOTE — Progress Notes (Signed)
 Updated pts son at bedside regarding pts condition and current plan of care.  All questions were answered.  Lonell Moose, AGNP  Pulmonary/Critical Care Pager [phone] (please enter 7 digits) PCCM Consult Pager [phone] (please enter 7 digits)

## 2024-02-29 NOTE — Consult Note (Signed)
 PHARMACY CONSULT NOTE - FOLLOW UP  Pharmacy Consult for Electrolyte Monitoring and Replacement   Recent Labs: Potassium (mmol/L)  Date Value  02/29/2024 4.3   Magnesium (mg/dL)  Date Value  88/87/7974 1.7   Calcium (mg/dL)  Date Value  88/87/7974 9.0   Albumin (g/dL)  Date Value  88/88/7974 3.7   Phosphorus (mg/dL)  Date Value  88/87/7974 2.5   Sodium (mmol/L)  Date Value  02/29/2024 143     Assessment: Bryan Howard is a 89yoM that presented hypotensive and with AMS. Pharmacy has been consulted for electrolyte replacement.  Renal function stable.  Goal of Therapy: electrolytes WNL  Plan:  K, phos wnl Mag 1.7 --> ordered Mag 2g IV x1 Monitor with AM labs  Bryan Howard ,PharmD 02/29/2024 9:24 AM

## 2024-02-29 NOTE — TOC Initial Note (Signed)
 Transition of Care (TOC) - Initial/Assessment Note    Patient Details  Name: Bryan Howard. MRN: [id number] Date of Birth: 18-Jun-1932  [facility] CM/SW Contact:    Bryan JINNY Ruts, LCSW Phone Number: 02/29/2024, 2:53 PM  Clinical Narrative:                 Chart reviewed. The patient was admitted for septic shock. The patient is not fully oriented. I was able to speak with the patient son, Bryan Howard via telephone. I introduced myself, my role, and reason for consult. The patient son reports that the patient has a PCP. The patient son reports that the patient lives by himself. The patient reports that the patient needs assistance with daily living task. The patient son reports that caregivers would assist the patient.   The patient son reports that her or the caregivers would drive the patient to medical appointments. The patient son reports that the patient uses psychologist, forensic. The patient son reports that the patient is active with [facility] and seen the yesterday. The patient son reports that the patient was a resident at [facility] 2 months ago and only stayed for 2 days. The patient son reports that SNF placement is not a option.   The patient son reports that the patient has a scooter, walker, and shower seat in the home. The patient son did not have any concerns or questions during the time of the assessment.   TOC will follow the patient until D/C     Barriers to Discharge: Continued Medical Work up   Patient Goals and CMS Choice            Expected Discharge Plan and Services       Living arrangements for the past 2 months: Apartment                                      Prior Living Arrangements/Services Living arrangements for the past 2 months: Apartment Lives with:: Self Patient language and need for interpreter reviewed:: Yes        Need for Family Participation in Patient Care: Yes (Comment)     Criminal Activity/Legal  Involvement Pertinent to Current Situation/Hospitalization: No - Comment as needed  Activities of Daily Living   ADL Screening (condition at time of admission) Independently performs ADLs?: No Does the patient have a NEW difficulty with bathing/dressing/toileting/self-feeding that is expected to last >3 days?: No Does the patient have a NEW difficulty with getting in/out of bed, walking, or climbing stairs that is expected to last >3 days?: No Does the patient have a NEW difficulty with communication that is expected to last >3 days?: No Is the patient deaf or have difficulty hearing?: No Does the patient have difficulty seeing, even when wearing glasses/contacts?: No Does the patient have difficulty concentrating, remembering, or making decisions?: No  Permission Sought/Granted            Permission granted to share info w Relationship: Son  Permission granted to share info w Contact Information: Bryan Howard: [phone]  Emotional Assessment Appearance:: Other (Comment Required (Patient is not fully oriented) Attitude/Demeanor/Rapport: Unable to Assess Affect (typically observed): Unable to Assess Orientation: : Fluctuating Orientation (Suspected and/or reported Sundowners) Alcohol / Substance Use: Not Applicable Psych Involvement: No (comment)  Admission diagnosis:  Hyperkalemia [E87.5] Septic shock (HCC) [A41.9, R65.21] Hypotension, unspecified hypotension type [I95.9]  Urinary tract infection with hematuria, site unspecified [N39.0, R31.9] Patient Active Problem List   Diagnosis Date Noted   Septic shock (HCC) 02/28/2024   Family hx of prostate cancer 02/23/2024   Urinary tract infection 01/28/2024   Complicated urinary tract infection/ recurrent UTI 01/27/2024   Chronic heart failure with preserved ejection fraction (HFpEF) (HCC) 01/27/2024   Acute metabolic encephalopathy 01/27/2024   History of orchiectomy 01/27/2024   Hypotension 01/27/2024   History of spinal  surgery 01/27/2024   Moderate aortic stenosis 09/29/2023   Mild cognitive impairment 09/29/2023   Acute cystitis without hematuria 09/25/2023   Constipation 08/01/2023   Infrarenal abdominal aortic aneurysm (AAA) without rupture 01/03/2023   Personal history of other malignant neoplasm of skin 11/26/2022   Fall 11/19/2022   Chronic obstructive pulmonary disease (HCC) 11/10/2022   Congestive heart failure (HCC) 11/10/2022   Current moderate episode of major depressive disorder (HCC) 11/10/2022   History of pulmonary embolism 09/29/2022   Acute pulmonary embolism (HCC) 09/29/2022   Epididymo-orchitis 10/01/2019   Lethargy 10/01/2019   Hx of bacterial meningitis 09/19/2019   Hx of osteomyelitis 09/19/2019   Essential hypertension 12/11/2017   Degenerative disc disease, lumbar 12/11/2017   Right inguinal hernia 12/11/2017   Osteoarthritis of both glenohumeral joints 08/05/2017   Disequilibrium 09/29/2016   PD (perceptive deafness), asymmetrical 09/29/2016   Generalized muscle weakness 08/20/2016   Primary osteoarthritis of right hip 07/16/2016   Right lumbar radiculitis 06/22/2016   Greater trochanteric bursitis of left hip 06/18/2016   Greater trochanteric bursitis of right hip 06/18/2016   Choledocholithiasis 03/19/2016   Depression 12/30/2011   Obesity, unspecified 12/28/2011   Transfusion of blood product refused for religious reason 12/14/2011   OSA (obstructive sleep apnea) 09/08/2011   Hypothyroidism 10/13/2007   HYPERLIPIDEMIA 10/13/2007   Coronary atherosclerosis 10/13/2007   External hemorrhoids 10/13/2007   ESOPHAGEAL STRICTURE 10/13/2007   GERD 10/13/2007   Diaphragmatic hernia 10/13/2007   Diverticulosis of colon 10/13/2007   History of TIA (transient ischemic attack) 10/13/2007   History of colonic polyps 10/13/2007   PCP:  Bryan Railing, MD Pharmacy:   [facility] [address] ([address]), [place] - [address]  Bryan Howard [place]) [place] [address] Phone: [phone] Fax: [phone]     Social Drivers of Health (SDOH) Social History: SDOH Screenings   Food Insecurity: Patient Declined (02/28/2024)  Housing: Low Risk  (02/29/2024)  Transportation Needs: No Transportation Needs (02/29/2024)  Utilities: Not At Risk (02/29/2024)  Financial Resource Strain: Low Risk  (01/25/2024)   Received from [facility]  Physical Activity: Insufficiently Active (01/04/2024)   Received from [facility]  Social Connections: Moderately Isolated (02/29/2024)  Stress: No Stress Concern Present (01/04/2024)   Received from [facility]  Tobacco Use: Medium Risk (02/28/2024)  Health Literacy: Inadequate Health Literacy (01/04/2024)   Received from [facility]   SDOH Interventions:     Readmission Risk Interventions     No data to display

## 2024-03-01 LAB — BASIC METABOLIC PANEL WITH GFR
Anion gap: 9 (ref 5–15)
BUN: 12 mg/dL (ref 8–23)
CO2: 24 mmol/L (ref 22–32)
Calcium: 9 mg/dL (ref 8.9–10.3)
Chloride: 109 mmol/L (ref 98–111)
Creatinine, Ser: 0.67 mg/dL (ref 0.61–1.24)
GFR, Estimated: >60 mL/min (ref >60)
Glucose, Bld: 86 mg/dL (ref 70–99)
Potassium: 3.8 mmol/L (ref 3.5–5.1)
Sodium: 142 mmol/L (ref 135–145)

## 2024-03-01 LAB — MAGNESIUM: Magnesium: 1.9 mg/dL (ref 1.7–2.4)

## 2024-03-01 LAB — PHOSPHORUS
Phosphorus: 1.6 mg/dL — ABNORMAL LOW (ref 2.5–4.6)
Phosphorus: 2.8 mg/dL (ref 2.5–4.6)

## 2024-03-01 MED ORDER — MAGNESIUM SULFATE 2 GM/50ML IV SOLN
2.0000 g | Freq: Once | INTRAVENOUS | Status: AC
  Administered 2024-03-01: 2 g via INTRAVENOUS
  Filled 2024-03-01: qty 50

## 2024-03-01 MED ORDER — SODIUM CHLORIDE 0.9 % IV SOLN: 1.0000 g | Freq: Three times a day (TID) | INTRAVENOUS | Status: DC

## 2024-03-01 MED ORDER — DULOXETINE HCL 30 MG PO CPEP
30.0000 mg | ORAL_CAPSULE | Freq: Every day | ORAL | Status: DC
  Administered 2024-03-01 – 2024-03-06 (×6): 30 mg via ORAL
  Filled 2024-03-01 (×6): qty 1

## 2024-03-01 MED ORDER — ACETAMINOPHEN 10 MG/ML IV SOLN
1000.0000 mg | Freq: Four times a day (QID) | INTRAVENOUS | Status: AC
  Administered 2024-03-01 – 2024-03-02 (×4): 1000 mg via INTRAVENOUS
  Filled 2024-03-01 (×4): qty 100

## 2024-03-01 MED ORDER — SODIUM CHLORIDE 0.9 % IV SOLN
1.0000 g | Freq: Three times a day (TID) | INTRAVENOUS | Status: DC
Start: 2024-03-02 — End: 2024-03-06
  Administered 2024-03-02 – 2024-03-06 (×13): 1 g via INTRAVENOUS
  Filled 2024-03-01 (×14): qty 20

## 2024-03-01 MED ORDER — POTASSIUM PHOSPHATES 15 MMOLE/5ML IV SOLN
30.0000 mmol | Freq: Once | INTRAVENOUS | Status: AC
  Administered 2024-03-01: 30 mmol via INTRAVENOUS
  Filled 2024-03-01: qty 10

## 2024-03-01 NOTE — Consult Note (Signed)
 PHARMACY CONSULT NOTE  Pharmacy Consult for Electrolyte Monitoring and Replacement   Recent Labs: Potassium (mmol/L)  Date Value  03/01/2024 3.8   Magnesium (mg/dL)  Date Value  88/86/7974 1.9   Calcium (mg/dL)  Date Value  88/86/7974 9.0   Albumin (g/dL)  Date Value  88/88/7974 3.7   Phosphorus (mg/dL)  Date Value  88/86/7974 1.6 (L)   Sodium (mmol/L)  Date Value  03/01/2024 142   Assessment: Bryan Howard is a 89yoM that presented hypotensive and with AMS. Pharmacy has been consulted for electrolyte replacement.  Goal of Therapy: electrolytes WNL  Plan:  Phos 1.6, potassium phosphate 30 mmol IV x 1 Mg 1.9, magnesium sulfate 2 g IV x 1 Re-check electrolytes tomorrow AM  Marolyn KATHEE Mare 03/01/2024 8:46 AM

## 2024-03-01 NOTE — Progress Notes (Signed)
 This patient has been transferred to room 104. Telemetry monitoring has been set up. Tele sitter monitoring has been transferred with the patient. This RN has called to report the transfer after establishing the patient in his new room. Tele sitter wants 1C RN Lundy) to call them to set up the camera. The ICU charge nurse has set the bed alarm on the bed and has placed floor mats in the floor. This RN has notified the 1C charge nurse that tele sitter wants a call from a 1C staff nurse.

## 2024-03-01 NOTE — Care Management Important Message (Signed)
 Important Message  Patient Details  Name: Bryan Howard. MRN: [id number] Date of Birth: [birth year]   Important Message Given:  Yes - Medicare IM     [name] 03/01/2024, 2:24 PM

## 2024-03-01 NOTE — Progress Notes (Signed)
 NAME:  Bryan Noreen., MRN:  [id number], DOB:  06-29-1932, LOS: 2 ADMISSION DATE:  02/28/2024, CHIEF COMPLAINT:  Septic Shock   History of Present Illness:   This is an 89 year old Caucasian male with a past medical history of recurrent UTIs, AAA, CAD, diverticulosis, esophageal stricture, GERD, hyperlipidemia, hyperthyroidism and hypothyroidism who presented to the ED via EMS for complaints of increased confusion, low blood pressure and possible UTI.  History is obtained from patient, and patient's records.  He is pleasantly confused.  He states that he was doing fine when he was brought to the emergency room.  Per ED records, patient's caregiver called EMS because he was hypotensive and had changes in mental status and they suspected he might have a UTI.  His ED workup revealed a potassium of 5.9, glucose of 130, BUN of 30, troponin of 4.5, hemoglobin of 10.7, hematocrit of 34.1, and his urine was cloudy with large amounts of leukocytes. He was ruled in for urosepsis and started on IV fluids and empiric antibiotics.  Despite fluid resuscitation patient remained in refractory shock hence requiring vasopressors.  PCCM was called to admit and continue patient's management. He remains on pressors though his requirements are improving with IV fluids.  He is awake and offers no complaints.  He is pleasantly confused and denies pain, shortness of breath dizziness and headache. Has a history of frequent UTIs and his last urinalysis showed ESBL in urine.  He is being covered empirically for ESBL UTI  Pertinent  Medical History  -AAA -CAD -HLD -Chronic Anemia -Recurrent UTI's  Significant Hospital Events: Including procedures, antibiotic start and stop dates in addition to other pertinent events   02/28/2024: admit with sepsis from UTI, on vasopressors 02/29/2024: on vasopressors, confused 03/01/2024: off vasopressors, remains confused  Interim History / Subjective:  Awake and alert but  disoriented x4  Objective    Blood pressure (!) 144/78, pulse 68, temperature 97.9 F (36.6 C), temperature source Oral, resp. rate (!) 33, height 5' 5 (1.651 m), weight 75.2 kg, SpO2 100%.        Intake/Output Summary (Last 24 hours) at 03/01/2024 0953 Last data filed at 03/01/2024 0800 Gross per 24 hour  Intake 1431.95 ml  Output 1600 ml  Net -168.05 ml   Filed Weights   02/28/24 2228 02/29/24 0500 03/01/24 0500  Weight: 75.1 kg 75.1 kg 75.2 kg    Examination: Physical Exam Constitutional:      General: He is not in acute distress.    Appearance: He is not ill-appearing.  Cardiovascular:     Rate and Rhythm: Normal rate and regular rhythm.     Pulses: Normal pulses.     Heart sounds: Normal heart sounds.  Pulmonary:     Effort: Pulmonary effort is normal.     Breath sounds: Normal breath sounds. No wheezing.  Abdominal:     Palpations: Abdomen is soft.  Neurological:     Mental Status: He is disoriented.     Assessment and Plan   89 year old male with history of recurrent UTI (previously with ESBL) admitted with septic shock secondary to UTI, on vasopressors.  #Septic Shock #UTI #CAD #Toxic Metabolic Encephalopathy #Agitated Delirium #Hypothyroidism  Neuro - patient with delirium, and is currently confused in the setting of acute infection and encephalopathy. Avoiding psychotropic medications as able. Frequent re-orientation.  CV - septic shock secondary to UTI, received IV fluids, holding on further resuscitation pending TTE. Lactic acid normalized, and he was weaned off  vasopressor support as of this morning.  Pulm - on room air, no acute issues.  GI - NPO for now, SLP consult placed.  Renal - kidney function within normal as are electrolytes.  Endo - resume levothyroxine  Hem/Onc - heparin subQ for DVT prophylaxis  ID - on Meropenem pending urine cultures given history of ESBL Klebsiella. Urine and blood cultures sent and pending. Blood culture  with one positive bottle for GPC likely contaminant.  Labs   CBC: Recent Labs  Lab 02/28/24 1527 02/29/24 0148  WBC 9.0 11.2*  NEUTROABS 6.9  --   HGB 10.7* 9.8*  HCT 34.1* 30.9*  MCV 104.3* 104.4*  PLT 173 182    Basic Metabolic Panel: Recent Labs  Lab 02/28/24 1527 02/28/24 1807 02/28/24 2040 02/29/24 0148 03/01/24 0423  NA 142  --  143 143 142  K 5.9*  --  3.3* 4.3 3.8  CL 109  --  111 112* 109  CO2 26  --  15* 22 24  GLUCOSE 130*  --  112* 176* 86  BUN 30*  --  25* 22 12  CREATININE 0.97  --  0.78 0.78 0.67  CALCIUM 10.0  --  8.8* 9.0 9.0  MG  --  1.7  --  1.7 1.9  PHOS  --  2.6  --  2.5 1.6*   GFR: Estimated Creatinine Clearance: 59.3 mL/min (by C-G formula based on SCr of 0.67 mg/dL). Recent Labs  Lab 02/28/24 1527 02/28/24 2329 02/29/24 0148 02/29/24 0825 02/29/24 1106 02/29/24 1408 02/29/24 1623  WBC 9.0  --  11.2*  --   --   --   --   LATICACIDVEN 1.5   < > 3.9* 1.1 0.9 1.7 1.9   < > = values in this interval not displayed.    Liver Function Tests: Recent Labs  Lab 02/28/24 1527  AST 21  ALT 18  ALKPHOS 86  BILITOT 0.4  PROT 6.6  ALBUMIN 3.7   No results for input(s): LIPASE, AMYLASE in the last 168 hours. No results for input(s): AMMONIA in the last 168 hours.  ABG    Component Value Date/Time   HCO3 32.4 (H) 01/27/2024 1806   O2SAT 31.7 01/27/2024 1806     Coagulation Profile: Recent Labs  Lab 02/28/24 1527  INR 1.0    Cardiac Enzymes: No results for input(s): CKTOTAL, CKMB, CKMBINDEX, TROPONINI in the last 168 hours.  HbA1C: No results found for: HGBA1C  CBG: Recent Labs  Lab 02/28/24 1806 02/28/24 2225  GLUCAP 82 109*    Review of Systems:   N/A  Past Medical History:  He,  has a past medical history of Abdominal aortic aneurysm, Coronary artery disease, Diverticulosis of colon, Esophageal stricture, GERD (gastroesophageal reflux disease), Hemorrhoids, external, Hiatal hernia, Hyperlipidemia,  and Hypothyroidism.   Surgical History:   Past Surgical History:  Procedure Laterality Date   ABDOMINAL AORTIC ANEURYSM REPAIR     APPENDECTOMY     EYE SURGERY     bilateral cataract surgery   INGUINAL HERNIA REPAIR     Right inguinal herniography   NECK SURGERY     fuse vertabrae    ROTATOR CUFF REPAIR     left   TOTAL KNEE ARTHROPLASTY     left    TOTAL KNEE ARTHROPLASTY     right     Social History:   reports that he has quit smoking. He has never used smokeless tobacco. He reports that he does not drink alcohol  and does not use drugs.   Family History:  His family history includes Colon cancer in his cousin; Heart disease in his unknown relative.   Allergies Allergies  Allergen Reactions   Captopril Cough   Quinolones Other (See Comments)    AAA patient- avoid if possible     Home Medications  Prior to Admission medications   Medication Sig Start Date End Date Taking? Authorizing Provider  acetaminophen (TYLENOL) 650 MG CR tablet Take one tablet every morning and two tablets nightly 08/12/22  Yes [provider]  Alpha-Lipoic Acid 200 MG CAPS Take 200 mg by mouth daily.   Yes [provider]  atorvastatin (LIPITOR) 80 MG tablet Take 80 mg by mouth daily. 03/15/22 07/10/24 Yes [provider]  celecoxib (CELEBREX) 200 MG capsule Take 200 mg by mouth. 02/13/24  Yes [provider]  Cholecalciferol 25 MCG (1000 UT) tablet Take 1,000 Units by mouth daily.   Yes [provider]  Coenzyme Q10 100 MG capsule Take 200 mg by mouth.   Yes [provider]  cyanocobalamin ([employer] VITAMIN B12) 1000 MCG tablet Take 1,000 mcg by mouth daily.    Yes [provider]  DULoxetine HCl 30 MG CSDR Take 30 mg by mouth daily.   Yes [provider]  folic acid (FOLVITE) 800 MCG tablet Take 800 mcg by mouth at bedtime. 07/11/23  Yes [provider]  gabapentin (NEURONTIN) 300 MG capsule Take 300 mg by mouth at  bedtime.   Yes [provider]  HYDROcodone-acetaminophen (NORCO) 7.5-325 MG tablet Take 1 tablet by mouth 2 (two) times daily as needed for moderate pain (pain score 4-6) or severe pain (pain score 7-10).   Yes [provider]  levothyroxine (SYNTHROID) 112 MCG tablet Take 112 mcg by mouth daily before breakfast. 07/09/22 07/10/24 Yes [provider]  losartan (COZAAR) 50 MG tablet Take 50 mg by mouth daily.   Yes [provider]  omeprazole (PRILOSEC) 20 MG capsule Take 20 mg by mouth 2 (two) times daily as needed.    Yes [provider]  polyethylene glycol (MIRALAX / GLYCOLAX) 17 g packet Take 17 g by mouth daily as needed for mild constipation or moderate constipation. 11/23/22  Yes [provider]  senna (SENOKOT) 8.6 MG tablet Take 1 tablet by mouth 2 (two) times daily. 09/29/23 09/28/24 Yes [provider]  ascorbic acid (VITAMIN C) 500 MG tablet Take 500 mg by mouth 2 (two) times daily.    [provider]  furosemide (LASIX) 40 MG tablet Take 40 mg by mouth. Patient not taking: Reported on 02/28/2024 12/30/21   [provider]  linezolid (ZYVOX) 600 MG tablet Take 600 mg by mouth 2 (two) times daily. Patient not taking: Reported on 02/28/2024 09/03/23   [provider]  naloxone [facility]) nasal spray 4 mg/0.1 mL Place 1 spray into the nose once as needed. 09/29/23   [provider]  phenazopyridine (PYRIDIUM) 100 MG tablet Take 100 mg by mouth 3 (three) times daily as needed. Patient not taking: Reported on 02/28/2024 01/02/24   [provider]  sennosides-docusate sodium (SENOKOT-S) 8.6-50 MG tablet Take 1 tablet by mouth daily.    [provider]     The patient is critically ill due to septic shock, UTI, delirium.  Critical care was necessary to treat or prevent imminent or life-threatening deterioration. I personally performed titration, monitoring, and management of  vasopressor/ionotrope infusion. Critical care time was spent by me on  the following activities: development of a treatment plan with the patient and/or surrogate as well as nursing, discussions with consultants, evaluation of the patient's response to treatment, examination of the patient, obtaining a history from the patient or surrogate, ordering and performing treatments and interventions, ordering and review of laboratory studies, ordering and review of radiographic studies, review of telemetry data including pulse oximetry, re-evaluation of patient's condition and participation in multidisciplinary rounds.   I personally spent 31 minutes providing critical care not including any separately billable procedures.   Belva November, MD  Pulmonary Critical Care 03/01/2024 9:54 AM

## 2024-03-01 NOTE — Evaluation (Signed)
 Physical Therapy Evaluation Patient Details Name: Bryan Howard. MRN: [id number] DOB: July 22, 1932 Today's Date: 03/01/2024  History of Present Illness  89 year old Caucasian male with a past medical history of recurrent UTIs, AAA, CAD, diverticulosis, esophageal stricture, GERD, hyperlipidemia, hyperthyroidism and hypothyroidism who presented to the ED via EMS for complaints of increased confusion, low blood pressure and possible UTI. Has a history of frequent UTIs and his last urinalysis showed ESBL in urine. MD dx includes septic shock, lactic acidosis, hx of CAD, hx of HTN.  Clinical Impression Pt is pleasantly confused 89 y.o. male admitted for septic shock. Pt is A&Ox4, however seemed to have difficulty with initiation and had slowed processing time. No family at bedside to confirm pt's PLOF however per the patient, he was IND with amb using a 3-wheeled rollator. Pt reports IND with ADLs however has a PCA coming 3-4x/wk to assist with IADLs. Pt requires mod A for supine>sit for trunk elevation. VCs for sequencing and to initiate LEs to EOB. Pt requires max A for STS and step pivot transfer. Multimodal cuing provided for upright posture and sequencing. Amb deferred for safety. Pt demonstrates deficits in strength/activity tolerance/balance affecting his ability to safely perform functional mobility. At this time, pt is a high risk for falls. Would benefit from skilled PT to address above deficits and promote optimal return to PLOF. Pt left in recliner with needs nearby. Chair alarm set and telesitter present.       If plan is discharge home, recommend the following: Two people to help with walking and/or transfers;A lot of help with bathing/dressing/bathroom;Assist for transportation;Supervision due to cognitive status   Can travel by private vehicle   No    Equipment Recommendations Other (comment) (TBD at next venue)  Recommendations for Other Services       Functional Status  Assessment Patient has had a recent decline in their functional status and demonstrates the ability to make significant improvements in function in a reasonable and predictable amount of time.     Precautions / Restrictions Precautions Precautions: Fall Recall of Precautions/Restrictions: Impaired Restrictions Weight Bearing Restrictions Per Provider Order: No      Mobility  Bed Mobility Overal bed mobility: Needs Assistance Bed Mobility: Supine to Sit     Supine to sit: Mod assist     General bed mobility comments: Assist for trunk elevation and initiating LEs to EOB. Cuing for sequencing and hand placement.    Transfers Overall transfer level: Needs assistance Equipment used: Rolling walker (2 wheels) Transfers: Sit to/from Stand, Bed to chair/wheelchair/BSC Sit to Stand: Max assist   Step pivot transfers: Max assist       General transfer comment: Cuing for hand placement to stand. Pt demos appropriate forward lean to successfully stand. Max A for lift from lowest bed height. Pt tries to sit before chair is close enough behind him requiring cuing to maintain standing posture and continue stepping to chair. Needs assist for RW management    Ambulation/Gait               General Gait Details: Deferred d/t safety  Stairs            Wheelchair Mobility     Tilt Bed    Modified Rankin (Stroke Patients Only)       Balance Overall balance assessment: Needs assistance Sitting-balance support: Feet supported, Bilateral upper extremity supported Sitting balance-Leahy Scale: Fair Sitting balance - Comments: Sits with very kyphotic posturing and is only able to maintain  upright posture for short period.   Standing balance support: Bilateral upper extremity supported, During functional activity, Reliant on assistive device for balance Standing balance-Leahy Scale: Poor Standing balance comment: Heavy reliance on AD to maintain static stand. Mod A to  maintain balance as pt has posterior bias.                             Pertinent Vitals/Pain Pain Assessment Pain Assessment: No/denies pain    Home Living Family/patient expects to be discharged to:: Private residence Living Arrangements: Alone Available Help at Discharge: Personal care attendant;Available PRN/intermittently (PCA 3-4x/wk helping with IADLs per pt) Type of Home: Apartment         Home Layout: One level Home Equipment: Agricultural [employer]ant (2 wheels);Shower seat;Grab bars - toilet;Grab bars - tub/shower;BSC/3in1 (Reports BSC but not using it)      Prior Function Prior Level of Function : Independent/Modified Independent             Mobility Comments: pt reports independence with amb using 3-wheeled rollator ADLs Comments: Pt reports IND with IADLs.     Extremity/Trunk Assessment   Upper Extremity Assessment Upper Extremity Assessment: Generalized weakness    Lower Extremity Assessment Lower Extremity Assessment: Generalized weakness    Cervical / Trunk Assessment Cervical / Trunk Assessment: Kyphotic  Communication   Communication Communication: Impaired Factors Affecting Communication: Hearing impaired    Cognition Arousal: Alert Behavior During Therapy: WFL for tasks assessed/performed   PT - Cognitive impairments: History of cognitive impairments, No family/caregiver present to determine baseline, Initiation, Safety/Judgement                       PT - Cognition Comments: Pt is A&Ox4. Unsure of baseline or accuracy of PLOF given. No family at bedside to confirm. Following commands: Impaired Following commands impaired: Follows one step commands with increased time     Cueing Cueing Techniques: Verbal cues, Tactile cues, Visual cues     General Comments      Exercises     Assessment/Plan    PT Assessment Patient needs continued PT services  PT Problem List Decreased strength;Decreased activity tolerance;Decreased  balance;Decreased mobility;Decreased coordination;Decreased cognition;Decreased safety awareness       PT Treatment Interventions DME instruction;Gait training;Functional mobility training;Therapeutic exercise;Therapeutic activities;Balance training;Neuromuscular re-education;Patient/family education    PT Goals (Current goals can be found in the Care Plan section)  Acute Rehab PT Goals Patient Stated Goal: none stated PT Goal Formulation: With patient Time For Goal Achievement: 03/15/24 Potential to Achieve Goals: Good    Frequency Min 2X/week     Co-evaluation               AM-PAC PT 6 Clicks Mobility  Outcome Measure Help needed turning from your back to your side while in a flat bed without using bedrails?: A Little Help needed moving from lying on your back to sitting on the side of a flat bed without using bedrails?: A Lot Help needed moving to and from a bed to a chair (including a wheelchair)?: A Lot Help needed standing up from a chair using your arms (e.g., wheelchair or bedside chair)?: A Lot Help needed to walk in hospital room?: A Lot Help needed climbing 3-5 steps with a railing? : Total 6 Click Score: 12    End of Session Equipment Utilized During Treatment: Gait belt Activity Tolerance: Patient tolerated treatment well Patient left: in chair;with call bell/phone  within reach;with chair alarm set;with nursing/sitter in room (telesitter) Nurse Communication: Mobility status (per conversation with nursing, OT will assist with transfer back to bed) PT Visit Diagnosis: Unsteadiness on feet (R26.81);Muscle weakness (generalized) (M62.81);Difficulty in walking, not elsewhere classified (R26.2)    Time: 8641-8579 PT Time Calculation (min) (ACUTE ONLY): 22 min   Charges:                 Legrand Lasser, SPT   Kostantinos Tallman 03/01/2024, 2:56 PM

## 2024-03-01 NOTE — Progress Notes (Signed)
 Report has been called to 1C. The patient will be transfer to room 104. Report has been given to nurse Asberry. The patient's son Charolotte) has been notified that the patient will be transferred to 1C.

## 2024-03-01 NOTE — Evaluation (Signed)
 Occupational Therapy Evaluation Patient Details Name: Bryan Howard. MRN: [id number] DOB: April 15, 1933 Today's Date: 03/01/2024   History of Present Illness   89 year old male PMH of recurrent UTIs, AAA, CAD, diverticulosis, esophageal stricture, GERD, hyperlipidemia, hyperthyroidism and hypothyroidism who presented to the ED via EMS for complaints of increased confusion, low blood pressure and possible UTI. Has a history of frequent UTIs and his last urinalysis showed ESBL in urine. MD dx includes septic shock, lactic acidosis, hx of CAD, hx of HTN.     Clinical Impressions Pt was seen for OT evaluation this date. PTA, pt reports he was living at home with a 24/7 caregiver and was IND with use of 3 wheeled walker and ADLs. Per chart review from last admission, son reported pt is Min A with most tasks and has a 24/7 caregiver at home alone to assist with son checking in on weekends.  Pt presents with deficits in strength, balance, safety, activity tolerance and pain, affecting safe and optimal ADL completion. Pt currently requires Mod/Max A for UB dressing to change out gown. He needed set up assist for seated face washing. Mod A needed for STS from recliner to RW with cues for hand placement and increased time. Pt performed SPT from recliner to bed using RW with Max A, max cues and assist for RW management as pt attempted to sit prior to reaching bed surface. Edu provided on proper safe transfer techniques with pt verbalizing understanding, however d/t poor cognition, unlikely carryover. Max A required to return to supine and Mod A needed to reposition in bed with cues for technique.  Pt would benefit from skilled OT services to address noted impairments and functional limitations to maximize safety and independence while minimizing future risk of falls, injury, and readmission. Do anticipate the need for follow up OT services upon acute hospital DC.      If plan is discharge home,  recommend the following:   A lot of help with bathing/dressing/bathroom;A lot of help with walking and/or transfers;Two people to help with walking and/or transfers     Functional Status Assessment   Patient has had a recent decline in their functional status and demonstrates the ability to make significant improvements in function in a reasonable and predictable amount of time.     Equipment Recommendations   Other (comment) (defer to next venue)     Recommendations for Other Services         Precautions/Restrictions   Precautions Precautions: Fall Recall of Precautions/Restrictions: Impaired Restrictions Weight Bearing Restrictions Per Provider Order: No     Mobility Bed Mobility Overal bed mobility: Needs Assistance Bed Mobility: Sit to Supine       Sit to supine: Max assist   General bed mobility comments: to safely return to supine with cues for hand placement and technique for repositioning to center of bed; increased time and cues needed    Transfers Overall transfer level: Needs assistance Equipment used: Rolling walker (2 wheels) Transfers: Sit to/from Stand, Bed to chair/wheelchair/BSC Sit to Stand: Mod assist     Step pivot transfers: Max assist     General transfer comment: able to stand from recliner with cues for hand placement with Mod A, however to perform SPT pt requiring increased assist and cues as he tried to sit on EOB prior to being fully backed up to the bed and RW management assist      Balance Overall balance assessment: Needs assistance Sitting-balance support: Feet supported, Bilateral upper extremity  supported Sitting balance-Leahy Scale: Fair     Standing balance support: Bilateral upper extremity supported, During functional activity, Reliant on assistive device for balance Standing balance-Leahy Scale: Poor                             ADL either performed or assessed with clinical judgement   ADL Overall  ADL's : Needs assistance/impaired     Grooming: Wash/dry face;Sitting;Set up Grooming Details (indicate cue type and reason): in recliner         Upper Body Dressing : Moderate assistance;Sitting Upper Body Dressing Details (indicate cue type and reason): change out gown     Toilet Transfer: Maximal assistance;Moderate assistance;Rolling walker (2 wheels);Stand-pivot Statistician Details (indicate cue type and reason): simulated from recliner to bed with max cues for hand placement, pt tries to sit prior to reaching bed         Functional mobility during ADLs: Maximal assistance;Moderate assistance;Cueing for safety;Cueing for sequencing;Rolling walker (2 wheels)       Vision         Perception         Praxis         Pertinent Vitals/Pain Pain Assessment Pain Assessment: Faces Faces Pain Scale: Hurts whole lot Pain Location: bil shoulders and back Pain Descriptors / Indicators: Aching, Sore Pain Intervention(s): Monitored during session, Limited activity within patient's tolerance, Patient requesting pain meds-RN notified, Repositioned     Extremity/Trunk Assessment Upper Extremity Assessment Upper Extremity Assessment: Generalized weakness   Lower Extremity Assessment Lower Extremity Assessment: Generalized weakness   Cervical / Trunk Assessment Cervical / Trunk Assessment: Kyphotic   Communication Communication Communication: Impaired Factors Affecting Communication: Hearing impaired   Cognition Arousal: Alert Behavior During Therapy: WFL for tasks assessed/performed                                 Following commands: Impaired Following commands impaired: Follows one step commands with increased time, Follows one step commands inconsistently     Cueing  General Comments   Cueing Techniques: Verbal cues;Tactile cues;Visual cues  messaged MD and nurse about getting pain meds d/t complaints of pretty bad pain in his shoulders and  back   Exercises Other Exercises Other Exercises: Edu on role of OT in acute setting.   Shoulder Instructions      Home Living Family/patient expects to be discharged to:: Private residence Living Arrangements: Alone Available Help at Discharge: Personal care attendant;Available PRN/intermittently (PCA 3-4x/wk helping with IADLs per pt) Type of Home: Apartment       Home Layout: One level     Bathroom Shower/Tub: Producer, Television/film/video: Standard     Home Equipment: Agricultural Consultant (2 wheels);Shower seat;Grab bars - toilet;Grab bars - tub/shower;BSC/3in1 (Reports BSC but not using it)          Prior Functioning/Environment Prior Level of Function : Independent/Modified Independent;Patient poor historian/Family not available             Mobility Comments: pt reports independence with amb using 3-wheeled rollator ADLs Comments: Pt reports IND with IADLs.    OT Problem List: Decreased strength;Decreased activity tolerance;Impaired balance (sitting and/or standing);Decreased safety awareness;Pain   OT Treatment/Interventions: Self-care/ADL training;Therapeutic exercise;Balance training;Therapeutic activities;Energy conservation;DME and/or AE instruction;Patient/family education      OT Goals(Current goals can be found in the care plan section)   Acute Rehab  OT Goals Patient Stated Goal: get stronger OT Goal Formulation: With patient Time For Goal Achievement: 03/15/24 Potential to Achieve Goals: Good ADL Goals Pt Will Perform Grooming: with supervision;sitting Pt Will Perform Lower Body Dressing: with min assist;sit to/from stand;sitting/lateral leans Pt Will Transfer to Toilet: with min assist;ambulating;stand pivot transfer   OT Frequency:  Min 2X/week    Co-evaluation              AM-PAC OT 6 Clicks Daily Activity     Outcome Measure Help from another person eating meals?: A Little Help from another person taking care of personal  grooming?: A Little Help from another person toileting, which includes using toliet, bedpan, or urinal?: A Lot Help from another person bathing (including washing, rinsing, drying)?: A Lot Help from another person to put on and taking off regular upper body clothing?: A Lot Help from another person to put on and taking off regular lower body clothing?: A Lot 6 Click Score: 14   End of Session Equipment Utilized During Treatment: Gait belt;Rolling walker (2 wheels) Nurse Communication: Mobility status  Activity Tolerance: Patient tolerated treatment well;Patient limited by pain Patient left: in bed;with call bell/phone within reach;with bed alarm set  OT Visit Diagnosis: Other abnormalities of gait and mobility (R26.89);Muscle weakness (generalized) (M62.81);Unsteadiness on feet (R26.81)                Time: 1550-1611 OT Time Calculation (min): 21 min Charges:  OT General Charges $OT Visit: 1 Visit OT Evaluation $OT Eval Moderate Complexity: 1 Mod Daviana Haymaker, OTR/L 03/01/24, 5:11 PM  Kerri Kovacik E Kalisha Keadle 03/01/2024, 5:06 PM

## 2024-03-01 NOTE — Plan of Care (Signed)
  Problem: Clinical Measurements: Goal: Diagnostic test results will improve Outcome: Progressing Goal: Respiratory complications will improve Outcome: Progressing Goal: Cardiovascular complication will be avoided Outcome: Progressing   Problem: Nutrition: Goal: Adequate nutrition will be maintained Outcome: Progressing   Problem: Coping: Goal: Level of anxiety will decrease Outcome: Progressing

## 2024-03-02 ENCOUNTER — Encounter: Payer: Self-pay | Admitting: Podiatry

## 2024-03-02 DIAGNOSIS — A419 Sepsis, unspecified organism: Secondary | ICD-10-CM

## 2024-03-02 DIAGNOSIS — R6521 Severe sepsis with septic shock: Secondary | ICD-10-CM | POA: Diagnosis not present

## 2024-03-02 DIAGNOSIS — N390 Urinary tract infection, site not specified: Secondary | ICD-10-CM | POA: Diagnosis not present

## 2024-03-02 LAB — CBC
HCT: 31.4 % — ABNORMAL LOW (ref 39.0–52.0)
Hemoglobin: 10.3 g/dL — ABNORMAL LOW (ref 13.0–17.0)
MCH: 32.8 pg (ref 26.0–34.0)
MCHC: 32.8 g/dL (ref 30.0–36.0)
MCV: 100 fL (ref 80.0–100.0)
Platelets: 147 K/uL — ABNORMAL LOW (ref 150–400)
RBC: 3.14 MIL/uL — ABNORMAL LOW (ref 4.22–5.81)
RDW: 14.6 % (ref 11.5–15.5)
WBC: 6.2 K/uL (ref 4.0–10.5)
nRBC: 0 % (ref 0.0–0.2)

## 2024-03-02 LAB — CULTURE, BLOOD (ROUTINE X 2): Special Requests: ADEQUATE

## 2024-03-02 LAB — BASIC METABOLIC PANEL WITH GFR
Anion gap: 8 (ref 5–15)
BUN: 11 mg/dL (ref 8–23)
CO2: 27 mmol/L (ref 22–32)
Calcium: 9.3 mg/dL (ref 8.9–10.3)
Chloride: 106 mmol/L (ref 98–111)
Creatinine, Ser: 0.58 mg/dL — ABNORMAL LOW (ref 0.61–1.24)
GFR, Estimated: >60 mL/min (ref >60)
Glucose, Bld: 88 mg/dL (ref 70–99)
Potassium: 4.1 mmol/L (ref 3.5–5.1)
Sodium: 140 mmol/L (ref 135–145)

## 2024-03-02 LAB — PHOSPHORUS: Phosphorus: 2.5 mg/dL (ref 2.5–4.6)

## 2024-03-02 MED ORDER — ACETAMINOPHEN 325 MG PO TABS: 650.0000 mg | ORAL_TABLET | ORAL | Status: DC | PRN

## 2024-03-02 MED ORDER — FOLIC ACID 1 MG PO TABS
1.0000 mg | ORAL_TABLET | Freq: Every day | ORAL | Status: DC
  Administered 2024-03-03 – 2024-03-05 (×4): 1 mg via ORAL
  Filled 2024-03-02 (×6): qty 1

## 2024-03-02 MED ORDER — VITAMIN B-12 1000 MCG PO TABS
1000.0000 ug | ORAL_TABLET | Freq: Every day | ORAL | Status: DC
  Administered 2024-03-03 – 2024-03-06 (×4): 1000 ug via ORAL
  Filled 2024-03-02 (×4): qty 1

## 2024-03-02 MED ORDER — HYDROCODONE-ACETAMINOPHEN 7.5-325 MG PO TABS: 1.0000 | ORAL_TABLET | Freq: Two times a day (BID) | ORAL | Status: DC | PRN

## 2024-03-02 MED ORDER — SENNA 8.6 MG PO TABS
1.0000 | ORAL_TABLET | Freq: Two times a day (BID) | ORAL | Status: DC
  Administered 2024-03-03 – 2024-03-06 (×7): 8.6 mg via ORAL
  Filled 2024-03-02 (×8): qty 1

## 2024-03-02 MED ORDER — ENOXAPARIN SODIUM 40 MG/0.4ML IJ SOSY
40.0000 mg | PREFILLED_SYRINGE | INTRAMUSCULAR | Status: DC
  Administered 2024-03-02 – 2024-03-05 (×4): 40 mg via SUBCUTANEOUS
  Filled 2024-03-02 (×4): qty 0.4

## 2024-03-02 MED ORDER — POLYETHYLENE GLYCOL 3350 17 G PO PACK
17.0000 g | PACK | Freq: Every day | ORAL | Status: DC
  Administered 2024-03-02 – 2024-03-06 (×5): 17 g via ORAL
  Filled 2024-03-02 (×5): qty 1

## 2024-03-02 NOTE — Progress Notes (Signed)
 Progress Note   Patient: Bryan Howard. FMW:[id number] DOB: 05-10-1932 DOA: 02/28/2024     3 DOS: the patient was seen and examined on 03/02/2024   Brief hospital course: Per H&P HPI This is an 89 year old Caucasian male with a past medical history of recurrent UTIs, AAA, CAD, diverticulosis, esophageal stricture, GERD, hyperlipidemia, h/o PE, h/o prostate cancer s/p cryoablation, MCI, OSA not on CPAP, PUD, TIA, HFpEF, hypothyroidism who presented to the ED via EMS for complaints of increased confusion, low blood pressure and possible UTI.  History is obtained from patient, and patient's records.  He is pleasantly confused.  He states that he was doing fine when he was brought to the emergency room.  Per ED records, patient's caregiver called EMS because he was hypotensive and had changes in mental status and they suspected he might have a UTI.  His ED workup revealed a potassium of 5.9, glucose of 130, BUN of 30, troponin of 4.5, hemoglobin of 10.7, hematocrit of 34.1, and his urine was cloudy with large amounts of leukocytes. He was ruled in for urosepsis and started on IV fluids and empiric antibiotics.  Despite fluid resuscitation patient remained in refractory shock hence requiring vasopressors.  PCCM was called to admit and continue patient's management. He remains on pressors though his requirements are improving with IV fluids.  He is awake and offers no complaints.  He is pleasantly confused and denies pain, shortness of breath dizziness and headache. Has a history of frequent UTIs and his last urinalysis showed ESBL in urine.  He is being covered empirically for ESBL UTI  Assessment and Plan:  Septic shock secondary to UTI, shock physiology resolved Toxic metabolic encephalopathy   Patient is no vasopressors.  He is currently on meropenem given his history of ESBL UTI.  Urine cultures are still pending.  Will narrow antibiotics as able.  Of note, has a history of recurrent UTIs. 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 He is scheduled to get a barium enema.   Normocytic anemia    Latest Ref Rng & Units 03/02/2024   10:53 AM 02/29/2024    1:48 AM 02/28/2024    3:27 PM  CBC  WBC 4.0 - 10.5 K/uL 6.2  11.2  9.0   Hemoglobin 13.0 - 17.0 g/dL 89.6  9.8  89.2   Hematocrit 39.0 - 52.0 % 31.4  30.9  34.1   Platelets 150 - 400 K/uL 147  182  173    Will check iron panel in the am.    Hypothyroidism  TSH 0.259 low a month ago. Currently on 112mcg daily.  Will check TSH in the AM.   Chronic constipation Due to opioid use  Chronic pain On chronic opioids, gabapentin, and duloxetine.  Will hold gabapentin, and resume opioids prn.    HLD  On atorvastatin  80mg . Resume on discharge.    TIA  Not on ASA. Family will discuss resuming this with PCP.   COPD  Not on medications. Not in exacerbation.   HFpEF appears compensated  HTN Resume home antihypertensives when able.    AAA s/p repair noted.    H/o PE not on eliquis noted      Subjective: Some difficulty with getting out words.   Physical Exam: Vitals:   03/02/24 0403 03/02/24 0859 03/02/24 1143 03/02/24 1635  BP: (!) 145/105 118/63 121/68 109/68  Pulse: 75 60 64 (!)  56  Resp: 18 14 16 16   Temp: 98.5 F (36.9 C) (!) 97.4 F (36.3 C) 98 F (36.7 C) 97.8 F (36.6 C)  TempSrc:  Oral  Oral  SpO2: 94% 93% 96% 92%  Weight:      Height:       Physical Exam  Constitutional: In no distress.  Cardiovascular: Normal rate, regular rhythm. No lower extremity edema  Pulmonary: Non labored breathing on room air, no wheezing or rales.   Abdominal: Soft. Non distended and non tender Musculoskeletal: Normal range of motion.     Neurological: Alert and oriented to self.  Skin: Skin is warm and dry.   Data Reviewed:     Latest Ref Rng & Units 03/02/2024   10:53 AM 02/29/2024    1:48 AM 02/28/2024     3:27 PM  CBC  WBC 4.0 - 10.5 K/uL 6.2  11.2  9.0   Hemoglobin 13.0 - 17.0 g/dL 89.6  9.8  89.2   Hematocrit 39.0 - 52.0 % 31.4  30.9  34.1   Platelets 150 - 400 K/uL 147  182  173       Latest Ref Rng & Units 03/02/2024    4:38 AM 03/01/2024    4:23 AM 02/29/2024    1:48 AM  BMP  Glucose 70 - 99 mg/dL 88  86  823   BUN 8 - 23 mg/dL 11  12  22    Creatinine 0.61 - 1.24 mg/dL 9.41  9.32  9.21   Sodium 135 - 145 mmol/L 140  142  143   Potassium 3.5 - 5.1 mmol/L 4.1  3.8  4.3   Chloride 98 - 111 mmol/L 106  109  112   CO2 22 - 32 mmol/L 27  24  22    Calcium 8.9 - 10.3 mg/dL 9.3  9.0  9.0      Family Communication: Son  Disposition: Status is: Inpatient Remains inpatient appropriate because: IV antibiotics   Planned Discharge Destination: Home SNF recommended but son would prefer to care for him at home.     Time spent: 35 minutes  Author: Alban Pepper, MD 03/02/2024 5:40 PM  For on call review [email or web address].

## 2024-03-02 NOTE — Progress Notes (Signed)
 Subjective: Bryan Howard. presents today referred by Jyl Railing, MD for complaint of with chief concern of elongated, thickened, painful, discolored toenails for months. Patient has tried prior visit with community Podiatrist..   Past Medical History:  Diagnosis Date   Abdominal aortic aneurysm    Coronary artery disease    Diverticulosis of colon    Esophageal stricture    GERD (gastroesophageal reflux disease)    Hemorrhoids, external    Hiatal hernia    Hyperlipidemia    Hypothyroidism      Patient Active Problem List   Diagnosis Date Noted   Malnutrition of moderate degree 02/29/2024   Septic shock (HCC) 02/28/2024   Family hx of prostate cancer 02/23/2024   Urinary tract infection 01/28/2024   Complicated urinary tract infection/ recurrent UTI 01/27/2024   Chronic heart failure with preserved ejection fraction (HFpEF) (HCC) 01/27/2024   Acute metabolic encephalopathy 01/27/2024   History of orchiectomy 01/27/2024   Hypotension 01/27/2024   History of spinal surgery 01/27/2024   Moderate aortic stenosis 09/29/2023   Mild cognitive impairment 09/29/2023   Acute cystitis without hematuria 09/25/2023   Constipation 08/01/2023   Infrarenal abdominal aortic aneurysm (AAA) without rupture 01/03/2023   Personal history of other malignant neoplasm of skin 11/26/2022   Fall 11/19/2022   Chronic obstructive pulmonary disease (HCC) 11/10/2022   Congestive heart failure (HCC) 11/10/2022   Current moderate episode of major depressive disorder (HCC) 11/10/2022   History of pulmonary embolism 09/29/2022   Acute pulmonary embolism (HCC) 09/29/2022   Epididymo-orchitis 10/01/2019   Lethargy 10/01/2019   Hx of bacterial meningitis 09/19/2019   Hx of osteomyelitis 09/19/2019   Essential hypertension 12/11/2017   Degenerative disc disease, lumbar 12/11/2017   Right inguinal hernia 12/11/2017   Osteoarthritis of both glenohumeral joints 08/05/2017   Disequilibrium  09/29/2016   PD (perceptive deafness), asymmetrical 09/29/2016   Generalized muscle weakness 08/20/2016   Primary osteoarthritis of right hip 07/16/2016   Right lumbar radiculitis 06/22/2016   Greater trochanteric bursitis of left hip 06/18/2016   Greater trochanteric bursitis of right hip 06/18/2016   Choledocholithiasis 03/19/2016   Depression 12/30/2011   Obesity, unspecified 12/28/2011   Transfusion of blood product refused for religious reason 12/14/2011   OSA (obstructive sleep apnea) 09/08/2011   Hypothyroidism 10/13/2007   HYPERLIPIDEMIA 10/13/2007   Coronary atherosclerosis 10/13/2007   External hemorrhoids 10/13/2007   ESOPHAGEAL STRICTURE 10/13/2007   GERD 10/13/2007   Diaphragmatic hernia 10/13/2007   Diverticulosis of colon 10/13/2007   History of TIA (transient ischemic attack) 10/13/2007   History of colonic polyps 10/13/2007     Past Surgical History:  Procedure Laterality Date   ABDOMINAL AORTIC ANEURYSM REPAIR     APPENDECTOMY     EYE SURGERY     bilateral cataract surgery   INGUINAL HERNIA REPAIR     Right inguinal herniography   NECK SURGERY     fuse vertabrae    ROTATOR CUFF REPAIR     left   TOTAL KNEE ARTHROPLASTY     left    TOTAL KNEE ARTHROPLASTY     right     No current facility-administered medications on file prior to visit.   Current Outpatient Medications on File Prior to Visit  Medication Sig Dispense Refill   celecoxib (CELEBREX) 200 MG capsule Take 200 mg by mouth.     furosemide (LASIX) 40 MG tablet Take 40 mg by mouth. (Patient not taking: Reported on 02/28/2024)     linezolid (ZYVOX) 600  MG tablet Take 600 mg by mouth 2 (two) times daily. (Patient not taking: Reported on 02/28/2024)     phenazopyridine (PYRIDIUM) 100 MG tablet Take 100 mg by mouth 3 (three) times daily as needed. (Patient not taking: Reported on 02/28/2024)     senna (SENOKOT) 8.6 MG tablet Take 1 tablet by mouth 2 (two) times daily.     acetaminophen (TYLENOL)  650 MG CR tablet Take one tablet every morning and two tablets nightly     Alpha-Lipoic Acid 200 MG CAPS Take 200 mg by mouth daily.     ascorbic acid (VITAMIN C) 500 MG tablet Take 500 mg by mouth 2 (two) times daily.     atorvastatin (LIPITOR) 80 MG tablet Take 80 mg by mouth daily.     Cholecalciferol 25 MCG (1000 UT) tablet Take 1,000 Units by mouth daily.     Coenzyme Q10 100 MG capsule Take 200 mg by mouth.     cyanocobalamin ([employer] VITAMIN B12) 1000 MCG tablet Take 1,000 mcg by mouth daily.      DULoxetine HCl 30 MG CSDR Take 30 mg by mouth daily.     folic acid (FOLVITE) 800 MCG tablet Take 800 mcg by mouth at bedtime.     gabapentin (NEURONTIN) 300 MG capsule Take 300 mg by mouth at bedtime.     HYDROcodone-acetaminophen (NORCO) 7.5-325 MG tablet Take 1 tablet by mouth 2 (two) times daily as needed for moderate pain (pain score 4-6) or severe pain (pain score 7-10).     levothyroxine (SYNTHROID) 112 MCG tablet Take 112 mcg by mouth daily before breakfast.     losartan (COZAAR) 50 MG tablet Take 50 mg by mouth daily.     naloxone (NARCAN) nasal spray 4 mg/0.1 mL Place 1 spray into the nose once as needed.     omeprazole (PRILOSEC) 20 MG capsule Take 20 mg by mouth 2 (two) times daily as needed.      polyethylene glycol (MIRALAX / GLYCOLAX) 17 g packet Take 17 g by mouth daily as needed for mild constipation or moderate constipation.     sennosides-docusate sodium (SENOKOT-S) 8.6-50 MG tablet Take 1 tablet by mouth daily.       Allergies  Allergen Reactions   Captopril Cough   Quinolones Other (See Comments)    AAA patient- avoid if possible     Social History   Occupational History   Not on file  Tobacco Use   Smoking status: Former   Smokeless tobacco: Never  Vaping Use   Vaping status: Never Used  Substance and Sexual Activity   Alcohol use: No    Comment: occasionally   Drug use: No   Sexual activity: Not on file     Family History  Problem Relation Age of Onset    Colon cancer Cousin        maternal 1st cousin   Heart disease Unknown      Immunization History  Administered Date(s) Administered   PFIZER Comirnaty(Gray Top)Covid-19 Tri-Sucrose Vaccine 06/26/2019, 07/16/2019, 04/01/2020   Pfizer Covid-19 Vaccine Bivalent Booster 11yrs & up 02/09/2021   Pneumococcal Conjugate-13 03/11/2014   Pneumococcal Polysaccharide-23 04/19/2008   Tdap 09/15/2010     Objective: There were no vitals filed for this visit.  Bryan Howard. is a pleasant 89 y.o. male WD, WN in NAD. AAO x 3.  Vascular Examination: CFT <3 seconds b/l. DP pulses faintly palpable b/l. PT pulses nonpalpable b/l. Digital hair absent. Skin temperature gradient warm to  warm b/l. No pain with calf compression. No ischemia or gangrene. No cyanosis or clubbing noted b/l. Trace edema noted right lower extremity.   Neurological Examination: Sensation grossly intact b/l with 10 gram monofilament. Vibratory sensation diminished b/l.  Dermatological Examination: Pedal skin warm and supple b/l. No open wounds b/l. No interdigital macerations. Toenails 1-5 b/l thick, discolored, elongated with subungual debris and pain on dorsal palpation. No hyperkeratotic nor porokeratotic lesions.  Musculoskeletal Examination: Muscle strength 5/5 to all lower extremity muscle groups bilaterally. No pain, crepitus or joint limitation noted with ROM bilateral LE.  Radiographs: None  Assessment: 1. Pain due to onychomycosis of toenails of both feet   2. Other polyneuropathy     Plan: Patient was evaluated and treated. All patient's and/or POA's questions/concerns addressed on today's visit. Toenails 1-5 b/l debrided in length and girth without incident. Continue soft, supportive shoe gear daily. Report any pedal injuries to medical professional. Call office if there are any questions/concerns. -Patient/POA to call should there be question/concern in the interim.  Return in about 3 months (around  05/25/2024).  Delon LITTIE Merlin, DPM      [facility] LOCATION: [address] [place] [address]                   Office [phone]   [facility] LOCATION: [address] [place], [place] [address] Office [phone]

## 2024-03-02 NOTE — Plan of Care (Signed)
  Problem: Education: Goal: Knowledge of General Education information will improve Description: Including pain rating scale, medication(s)/side effects and non-pharmacologic comfort measures Outcome: Progressing   Problem: Health Behavior/Discharge Planning: Goal: Ability to manage health-related needs will improve Outcome: Progressing   Problem: Clinical Measurements: Goal: Ability to maintain clinical measurements within normal limits will improve Outcome: Progressing Goal: Will remain free from infection Outcome: Progressing Goal: Diagnostic test results will improve Outcome: Progressing Goal: Respiratory complications will improve Outcome: Progressing Goal: Cardiovascular complication will be avoided Outcome: Progressing   Problem: Activity: Goal: Risk for activity intolerance will decrease Outcome: Progressing   Problem: Nutrition: Goal: Adequate nutrition will be maintained Outcome: Progressing   Problem: Elimination: Goal: Will not experience complications related to bowel motility Outcome: Progressing Goal: Will not experience complications related to urinary retention Outcome: Progressing   Problem: Pain Managment: Goal: General experience of comfort will improve and/or be controlled Outcome: Progressing   Problem: Skin Integrity: Goal: Risk for impaired skin integrity will decrease Outcome: Progressing

## 2024-03-02 NOTE — TOC Progression Note (Addendum)
 Transition of Care (TOC) - Progression Note    Patient Details  Name: Bryan Howard. MRN: [id number] Date of Birth: [birth year]  Transition of Care [facility]) CM/SW Contact  Daved JONETTA Hamilton, RN Phone Number: 03/02/2024, 1:23 PM  Clinical Narrative:     PT/OT recommendations are for SNF-STR. Per prior [facility] discussion with patient's son Jazmine Longshore on 02/29/2024, SNF is not an option.   Patient is oriented to person and time only. TOC attempted to contact [name] via telephone with no answer and no option to lvmm. TOC will continue to attempt to reach [name] for safe discharge planning.  Update:  Patient's son [name] returned my call. Introduced self and explained role in discharge planning and therapy recommendations for STR. [name] verbalized his dad will not go to a SNF. Discussed with [name] concern of patient's recurrent hospital encounters and need for safe discharge plan. [name] verbalized that they pay out of pocket for a personal care assistant Tue-Sun day time, someone comes each evening to ensure patient takes his medication and then [name] is with patient Friday evening thru Sunday evening.   [name] verbalized he is on his way to the hospital now and inquired about anticipated discharge date. This CM advised I will reach out to provider, [name] verbalized agreement to this.     Barriers to Discharge: Continued Medical Work up               Expected Discharge Plan and Services       Living arrangements for the past 2 months: Apartment                                       Social Drivers of Health (SDOH) Interventions SDOH Screenings   Food Insecurity: Patient Declined (02/28/2024)  Housing: Low Risk  (02/29/2024)  Transportation Needs: No Transportation Needs (02/29/2024)  Utilities: Not At Risk (02/29/2024)  Financial Resource Strain: Low Risk  (01/25/2024)   Received from [facility]  Physical Activity: Insufficiently Active  (01/04/2024)   Received from [facility]  Social Connections: Moderately Isolated (02/29/2024)  Stress: No Stress Concern Present (01/04/2024)   Received from [facility]  Tobacco Use: Medium Risk (02/28/2024)  Health Literacy: Inadequate Health Literacy (01/04/2024)   Received from [facility]    Readmission Risk Interventions     No data to display

## 2024-03-02 NOTE — Consult Note (Addendum)
 PHARMACY CONSULT NOTE  Pharmacy Consult for Electrolyte Monitoring and Replacement   Recent Labs: Potassium (mmol/L)  Date Value  03/02/2024 4.1   Magnesium (mg/dL)  Date Value  88/86/7974 1.9   Calcium (mg/dL)  Date Value  88/85/7974 9.3   Albumin (g/dL)  Date Value  88/88/7974 3.7   Phosphorus (mg/dL)  Date Value  88/85/7974 2.5   Sodium (mmol/L)  Date Value  03/02/2024 140   Assessment: Bryan Howard is a 89yoM that presented hypotensive and with AMS. Pharmacy has been consulted for electrolyte replacement.  Goal of Therapy: electrolytes WNL  Plan:  No electrolyte replacement currently warranted Electrolytes have been stable for several days and patient no longer followed by CCM --> will discontinue electrolyte consult at this time  Thank you for involving pharmacy in this patient's care  Will M. Lenon, PharmD, BCPS Clinical Pharmacist 03/02/2024 8:02 AM

## 2024-03-03 DIAGNOSIS — N390 Urinary tract infection, site not specified: Secondary | ICD-10-CM | POA: Diagnosis not present

## 2024-03-03 DIAGNOSIS — A419 Sepsis, unspecified organism: Secondary | ICD-10-CM | POA: Diagnosis not present

## 2024-03-03 DIAGNOSIS — R6521 Severe sepsis with septic shock: Secondary | ICD-10-CM | POA: Diagnosis not present

## 2024-03-03 LAB — CBC WITH DIFFERENTIAL/PLATELET
Abs Immature Granulocytes: 0.12 K/uL — ABNORMAL HIGH (ref 0.00–0.07)
Basophils Absolute: 0 K/uL (ref 0.0–0.1)
Basophils Relative: 0 %
Eosinophils Absolute: 0 K/uL (ref 0.0–0.5)
Eosinophils Relative: 0 %
HCT: 32.1 % — ABNORMAL LOW (ref 39.0–52.0)
Hemoglobin: 10.6 g/dL — ABNORMAL LOW (ref 13.0–17.0)
Immature Granulocytes: 2 %
Lymphocytes Relative: 21 %
Lymphs Abs: 1.5 K/uL (ref 0.7–4.0)
MCH: 33.2 pg (ref 26.0–34.0)
MCHC: 33 g/dL (ref 30.0–36.0)
MCV: 100.6 fL — ABNORMAL HIGH (ref 80.0–100.0)
Monocytes Absolute: 0.7 K/uL (ref 0.1–1.0)
Monocytes Relative: 10 %
Neutro Abs: 4.6 K/uL (ref 1.7–7.7)
Neutrophils Relative %: 67 %
Platelets: 155 K/uL (ref 150–400)
RBC: 3.19 MIL/uL — ABNORMAL LOW (ref 4.22–5.81)
RDW: 14.6 % (ref 11.5–15.5)
WBC: 6.9 K/uL (ref 4.0–10.5)
nRBC: 0 % (ref 0.0–0.2)

## 2024-03-03 LAB — BASIC METABOLIC PANEL WITH GFR
Anion gap: 8 (ref 5–15)
BUN: 12 mg/dL (ref 8–23)
CO2: 29 mmol/L (ref 22–32)
Calcium: 9.7 mg/dL (ref 8.9–10.3)
Chloride: 106 mmol/L (ref 98–111)
Creatinine, Ser: 0.65 mg/dL (ref 0.61–1.24)
GFR, Estimated: >60 mL/min (ref >60)
Glucose, Bld: 98 mg/dL (ref 70–99)
Potassium: 4.5 mmol/L (ref 3.5–5.1)
Sodium: 142 mmol/L (ref 135–145)

## 2024-03-03 LAB — MAGNESIUM: Magnesium: 2.3 mg/dL (ref 1.7–2.4)

## 2024-03-03 LAB — VITAMIN B12: Vitamin B-12: 828 pg/mL (ref 180–914)

## 2024-03-03 LAB — IRON AND TIBC
Iron: 42 ug/dL — ABNORMAL LOW (ref 45–182)
Saturation Ratios: 20 % (ref 17.9–39.5)
TIBC: 206 ug/dL — ABNORMAL LOW (ref 250–450)
UIBC: 164 ug/dL

## 2024-03-03 LAB — PHOSPHORUS: Phosphorus: 2.6 mg/dL (ref 2.5–4.6)

## 2024-03-03 LAB — T4, FREE: Free T4: 1.32 ng/dL — ABNORMAL HIGH (ref 0.61–1.12)

## 2024-03-03 LAB — VITAMIN D 25 HYDROXY (VIT D DEFICIENCY, FRACTURES): Vit D, 25-Hydroxy: 38 ng/mL (ref 30–100)

## 2024-03-03 LAB — TSH: TSH: 1.55 u[IU]/mL (ref 0.350–4.500)

## 2024-03-03 LAB — FERRITIN: Ferritin: 80 ng/mL (ref 24–336)

## 2024-03-03 NOTE — Progress Notes (Signed)
 Physical Therapy Treatment Patient Details Name: Bryan Howard. MRN: [id number] DOB: [birth year] Today's Date: 03/03/2024   History of Present Illness 89 year old male PMH of recurrent UTIs, AAA, CAD, diverticulosis, esophageal stricture, GERD, hyperlipidemia, hyperthyroidism and hypothyroidism who presented to the ED via EMS for complaints of increased confusion, low blood pressure and possible UTI. Has a history of frequent UTIs and his last urinalysis showed ESBL in urine. MD dx includes septic shock, lactic acidosis, hx of CAD, hx of HTN.    PT Comments  Pt tolerated treatment well today; treatment with focus on safety with functional mobility and functional strength training. Today, he is able to demonstrate improvements in some assist levels. He is modA for bed mobility via log roll technique for mgmt of cLBP and minA for STS and SSPT from EOB>Recliner with RW. Pt continues to require increased cueing for safety, sequencing, and proper performance of task in RW due to impaired cognition.  Despite progress, pt continues to be limited with meeting goals secondary to impaired processing, functional weakness, decreased activity tolerance, increased pain levels, impaired safety awareness, and decreased gross balance. Decreased activity tolerance noted by RPE of 7-8/10 indicating vigorous activity after OOB mobility to recliner for breakfast. Pt continues to be a HIGH FALL RISK secondary to problem list noted above, therefore, he would benefit from assist from all mobility/ADL's at DC for fall/injury prevention. Pt will continue to benefit from skilled acute PT services to address deficits for return to baseline function.  Encourage OOB mobility with nursing and mobility tech for meals and toileting for continued progress towards goals and maintenance of IND with functional mobility while hospitalized.     If plan is discharge home, recommend the following: A lot of help with  bathing/dressing/bathroom;Assist for transportation;Supervision due to cognitive status;A lot of help with walking and/or transfers;Direct supervision/assist for medications management;Help with stairs or ramp for entrance   Can travel by private vehicle     Yes  Equipment Recommendations   (defer to post acute; if pt is to DC home would rec: mWC and use of RW instead of rollator)       Precautions / Restrictions Precautions Precautions: Fall Recall of Precautions/Restrictions: Impaired Restrictions Weight Bearing Restrictions Per Provider Order: No Other Position/Activity Restrictions: Strict I/O, SpO2 >/= 92%     Mobility  Bed Mobility   Bed Mobility: Rolling, Sidelying to Sit Rolling: Mod assist Sidelying to sit: Mod assist       General bed mobility comments: modA for log roll technique to exit bed to the R, HOB elevated, step by step cueing for safety, sequencing, and hand placement; increased time/effort. supervision for safety to scoot anteriorly towards EOB and posteriorly in recliner, BUE for support, increased time/effort.    Transfers Overall transfer level: Needs assistance   Transfers: Sit to/from Stand Sit to Stand: Min assist   Step pivot transfers: Min assist       General transfer comment: minA for STS transfers from EOB and to recliner with RW. minA for SSPT from EOB>recliner with RW. multimodal cues for safety, sequencing, hand placement, BOS, and RW management.    Ambulation/Gait               General Gait Details: see SSPT for details; further mobility deferred due to RPE of 7-8/10 indicating vigorous activity    Balance Overall balance assessment: Needs assistance Sitting-balance support: Feet supported, Bilateral upper extremity supported Sitting balance-Leahy Scale: Fair Sitting balance - Comments: Sits with very kyphotic  posturing, requires BUE for support   Standing balance support: Bilateral upper extremity supported, During  functional activity, Reliant on assistive device for balance Standing balance-Leahy Scale: Poor Standing balance comment: minA for SSPT from EOB>recliner with RW, mild posterior bias                            Communication Communication Communication: Impaired Factors Affecting Communication: Hearing impaired  Cognition Arousal: Alert Behavior During Therapy: WFL for tasks assessed/performed   PT - Cognitive impairments: Initiation, Safety/Judgement, Memory, Attention, Problem solving, No family/caregiver present to determine baseline                       PT - Cognition Comments: increased time for processing of task, short-term memory deficits Following commands: Impaired Following commands impaired: Follows one step commands with increased time    Cueing Cueing Techniques: Verbal cues, Tactile cues, Visual cues  Exercises Other Exercises Other Exercises: pt edu re: PT role/POC, pain management techniques for back pain (log roll technique), safety with mobility in RW, need for continued therapy services.    General Comments General comments (skin integrity, edema, etc.): assisted with gown change and set up of breakfast tray in recliner      Pertinent Vitals/Pain Pain Assessment Pain Assessment: 0-10 Pain Score: 8  Pain Location: generalized whole body (more back) due to arthritis Pain Descriptors / Indicators: Aching, Dull, Sore Pain Intervention(s): Limited activity within patient's tolerance, Monitored during session, Repositioned (pt declining RN offer of tylenol)     PT Goals (current goals can now be found in the care plan section) Acute Rehab PT Goals Patient Stated Goal: none stated PT Goal Formulation: With patient Time For Goal Achievement: 03/15/24 Potential to Achieve Goals: Good Progress towards PT goals: Progressing toward goals    Frequency    Min 2X/week       AM-PAC PT 6 Clicks Mobility   Outcome Measure  Help needed  turning from your back to your side while in a flat bed without using bedrails?: A Lot Help needed moving from lying on your back to sitting on the side of a flat bed without using bedrails?: A Lot Help needed moving to and from a bed to a chair (including a wheelchair)?: A Little Help needed standing up from a chair using your arms (e.g., wheelchair or bedside chair)?: A Little Help needed to walk in hospital room?: A Little Help needed climbing 3-5 steps with a railing? : Total 6 Click Score: 14    End of Session Equipment Utilized During Treatment: Gait belt Activity Tolerance: Patient tolerated treatment well Patient left: in chair;with call bell/phone within reach;with chair alarm set;with nursing/sitter in room Nurse Communication: Mobility status PT Visit Diagnosis: Unsteadiness on feet (R26.81);Muscle weakness (generalized) (M62.81);Difficulty in walking, not elsewhere classified (R26.2)     Time: 9094-9064 PT Time Calculation (min) (ACUTE ONLY): 30 min  Charges:    $Therapeutic Activity: 23-37 mins PT General Charges $$ ACUTE PT VISIT: 1 Visit                     Camie CHARLENA Kluver, PT, DPT 9:52 AM,03/03/24 Physical Therapist - [facility]

## 2024-03-03 NOTE — Hospital Course (Addendum)
  He is an 89 yo with a history of prostate cancer s/p cryoablation, and recurrent UTIs.   He presented confused and hypotensive and was found to be in septic shock. His symptoms have since resolved and he is currently on merrem given his history of ESBL in prior urine cultures.   He is currently undergoing work up for his recurrent UTIs outpatient with [facility]. 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   Because cystoscopy was not definitive his outpatient urologist recommended he undergo a barium enema.     Request consult

## 2024-03-03 NOTE — Progress Notes (Addendum)
 Progress Note   Patient: Bryan Howard. FMW:[id number] DOB: [birth year] DOA: 02/28/2024     4 DOS: the patient was seen and examined on 03/03/2024   Brief hospital course: Per H&P HPI This is an 89 year old male with a past medical history of recurrent UTIs, AAA, CAD, diverticulosis, esophageal stricture, GERD, hyperlipidemia, h/o PE, h/o prostate cancer s/p cryoablation, MCI, OSA not on CPAP, PUD, TIA, HFpEF, hypothyroidism who presented to the ED via EMS for complaints of increased confusion, low blood pressure and possible UTI.  History is obtained from patient, and patient's records.  He is pleasantly confused.  He states that he was doing fine when he was brought to the emergency room.  Per ED records, patient's caregiver called EMS because he was hypotensive and had changes in mental status and they suspected he might have a UTI.  His ED workup revealed a potassium of 5.9, glucose of 130, BUN of 30, troponin of 4.5, hemoglobin of 10.7, hematocrit of 34.1, and his urine was cloudy with large amounts of leukocytes. He was ruled in for urosepsis and started on IV fluids and empiric antibiotics.  Despite fluid resuscitation patient remained in refractory shock hence requiring vasopressors.  PCCM was called to admit and continue patient's management. He remains on pressors though his requirements are improving with IV fluids.  He is awake and offers no complaints.  He is pleasantly confused and denies pain, shortness of breath dizziness and headache. Has a history of frequent UTIs and his last urinalysis showed ESBL in urine.  He is being covered empirically for ESBL UTI  Assessment and Plan:  Septic shock secondary to UTI, shock physiology resolved Toxic metabolic encephalopathy Resolved.   He is currently on meropenem given his history of ESBL UTI.  Urine cultures are still pending.  Will narrow antibiotics as able.  Of note, has a history of recurrent UTIs. 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 He is scheduled to get a barium enema outpatient. Will discuss with urologist here if further work up should occur while he is inpatient.   Macrocytic anemia    Latest Ref Rng & Units 03/03/2024    2:20 AM 03/02/2024   10:53 AM 02/29/2024    1:48 AM  CBC  WBC 4.0 - 10.5 K/uL 6.9  6.2  11.2   Hemoglobin 13.0 - 17.0 g/dL 89.3  89.6  9.8   Hematocrit 39.0 - 52.0 % 32.1  31.4  30.9   Platelets 150 - 400 K/uL 155  147  182    Iron panel WNL. B12 WNL. Will check folate level    Hypothyroidism  TSH 0.259 low a month ago. TSH WNL limits this hospitalization. FT4 elevated 1.32.  Currently on 112mcg daily.  -Will continue current dose given TSH WNL.   Chronic constipation Due to opioid use. Continue senna BID and miralax daily, may require further escalation of bowel regimen.   Chronic pain On chronic opioids, gabapentin, and duloxetine.  Will hold gabapentin, and resume opioids prn.    HLD  On atorvastatin  80mg . Resume on discharge.    TIA  Not on ASA. Family will discuss resuming this with PCP.   COPD  Not on medications. Not in exacerbation.   HFpEF appears compensated   HTN Resume home antihypertensives when able. Will continue to hold these given normotension.    AAA s/p repair in 2008 noted.  H/o PE not on eliquis noted      Subjective: No issues ON. Feels thinking is back to baseline. Is eager to leave the hospital.   Physical Exam: Vitals:   03/03/24 0354 03/03/24 0500 03/03/24 0832 03/03/24 1155  BP: 123/66  (!) 140/70 104/81  Pulse: 78  83 98  Resp: 16  18 16   Temp: 98.3 F (36.8 C)  98.3 F (36.8 C) (!) 97.3 F (36.3 C)  TempSrc:   Oral   SpO2: 95%  96% 96%  Weight:  74.1 kg    Height:        Constitutional: In no distress.  Cardiovascular: Normal rate, regular rhythm. No lower extremity edema  Pulmonary: Non labored breathing on  room air, no wheezing or rales.   Abdominal: Soft. Non distended and non tender Musculoskeletal: Normal range of motion.     Neurological: Alert and oriented to person, place, and time.  Skin: Skin is warm and dry.    Data Reviewed:     Latest Ref Rng & Units 03/03/2024    2:20 AM 03/02/2024   10:53 AM 02/29/2024    1:48 AM  CBC  WBC 4.0 - 10.5 K/uL 6.9  6.2  11.2   Hemoglobin 13.0 - 17.0 g/dL 89.3  89.6  9.8   Hematocrit 39.0 - 52.0 % 32.1  31.4  30.9   Platelets 150 - 400 K/uL 155  147  182       Latest Ref Rng & Units 03/03/2024    2:20 AM 03/02/2024    4:38 AM 03/01/2024    4:23 AM  BMP  Glucose 70 - 99 mg/dL 98  88  86   BUN 8 - 23 mg/dL 12  11  12    Creatinine 0.61 - 1.24 mg/dL 9.34  9.41  9.32   Sodium 135 - 145 mmol/L 142  140  142   Potassium 3.5 - 5.1 mmol/L 4.5  4.1  3.8   Chloride 98 - 111 mmol/L 106  106  109   CO2 22 - 32 mmol/L 29  27  24    Calcium 8.9 - 10.3 mg/dL 9.7  9.3  9.0      Family Communication: Son at bedside.   Disposition: Status is: Inpatient Remains inpatient appropriate because: IV antibiotics   Planned Discharge Destination: Home SNF recommended but son would prefer to care for him at home.     Time spent: 35 minutes  Author: Alban Pepper, MD 03/03/2024 1:11 PM  For on call review [email or web address].

## 2024-03-04 DIAGNOSIS — N390 Urinary tract infection, site not specified: Secondary | ICD-10-CM | POA: Diagnosis not present

## 2024-03-04 DIAGNOSIS — A419 Sepsis, unspecified organism: Secondary | ICD-10-CM | POA: Diagnosis not present

## 2024-03-04 DIAGNOSIS — R6521 Severe sepsis with septic shock: Secondary | ICD-10-CM | POA: Diagnosis not present

## 2024-03-04 LAB — BASIC METABOLIC PANEL WITH GFR
Anion gap: 9 (ref 5–15)
BUN: 19 mg/dL (ref 8–23)
CO2: 28 mmol/L (ref 22–32)
Calcium: 9.7 mg/dL (ref 8.9–10.3)
Chloride: 104 mmol/L (ref 98–111)
Creatinine, Ser: 0.7 mg/dL (ref 0.61–1.24)
GFR, Estimated: >60 mL/min (ref >60)
Glucose, Bld: 92 mg/dL (ref 70–99)
Potassium: 4.2 mmol/L (ref 3.5–5.1)
Sodium: 142 mmol/L (ref 135–145)

## 2024-03-04 LAB — URINE CULTURE: Culture: >=100000 — AB

## 2024-03-04 LAB — CULTURE, BLOOD (ROUTINE X 2)
Culture: NO GROWTH
Special Requests: ADEQUATE

## 2024-03-04 LAB — CBC
HCT: 30.7 % — ABNORMAL LOW (ref 39.0–52.0)
Hemoglobin: 10.2 g/dL — ABNORMAL LOW (ref 13.0–17.0)
MCH: 33.2 pg (ref 26.0–34.0)
MCHC: 33.2 g/dL (ref 30.0–36.0)
MCV: 100 fL (ref 80.0–100.0)
Platelets: 172 K/uL (ref 150–400)
RBC: 3.07 MIL/uL — ABNORMAL LOW (ref 4.22–5.81)
RDW: 14.6 % (ref 11.5–15.5)
WBC: 6.8 K/uL (ref 4.0–10.5)
nRBC: 0 % (ref 0.0–0.2)

## 2024-03-04 LAB — FOLATE: Folate: >20 ng/mL (ref >5.9)

## 2024-03-04 MED ORDER — LACTULOSE 10 GM/15ML PO SOLN
20.0000 g | Freq: Two times a day (BID) | ORAL | Status: AC
  Administered 2024-03-04 (×2): 20 g via ORAL
  Filled 2024-03-04 (×2): qty 30

## 2024-03-04 NOTE — Plan of Care (Signed)
  Problem: Education: Goal: Knowledge of General Education information will improve Description: Including pain rating scale, medication(s)/side effects and non-pharmacologic comfort measures Outcome: Progressing   Problem: Nutrition: Goal: Adequate nutrition will be maintained Outcome: Progressing   Problem: Elimination: Goal: Will not experience complications related to urinary retention Outcome: Progressing   Problem: Pain Managment: Goal: General experience of comfort will improve and/or be controlled Outcome: Progressing   Problem: Safety: Goal: Ability to remain free from injury will improve Outcome: Progressing

## 2024-03-04 NOTE — Progress Notes (Signed)
 Progress Note   Patient: Bryan Howard. FMW:[id number] DOB: [birth year] DOA: 02/28/2024     5 DOS: the patient was seen and examined on 03/04/2024   Brief hospital course: Per H&P HPI This is an 89 year old male with a past medical history of recurrent UTIs, AAA, CAD, diverticulosis, esophageal stricture, GERD, hyperlipidemia, h/o PE, h/o prostate cancer s/p cryoablation, MCI, OSA not on CPAP, PUD, TIA, HFpEF, hypothyroidism who presented to the ED via EMS for complaints of increased confusion, low blood pressure and possible UTI.  History is obtained from patient, and patient's records.  He is pleasantly confused.  He states that he was doing fine when he was brought to the emergency room.  Per ED records, patient's caregiver called EMS because he was hypotensive and had changes in mental status and they suspected he might have a UTI.  His ED workup revealed a potassium of 5.9, glucose of 130, BUN of 30, troponin of 4.5, hemoglobin of 10.7, hematocrit of 34.1, and his urine was cloudy with large amounts of leukocytes. He was ruled in for urosepsis and started on IV fluids and empiric antibiotics.  Despite fluid resuscitation patient remained in refractory shock hence requiring vasopressors.  PCCM was called to admit and continue patient's management. He remains on pressors though his requirements are improving with IV fluids.  He is awake and offers no complaints.  He is pleasantly confused and denies pain, shortness of breath dizziness and headache. Has a history of frequent UTIs and his last urinalysis showed ESBL in urine.  He is being covered empirically for ESBL UTI  Assessment and Plan:  Septic shock secondary to UTI, shock physiology resolved Toxic metabolic encephalopathy Resolved.   He is currently on meropenem given his history of ESBL UTI.  Urine cultures are still pending.  Will narrow antibiotics as able.  Of note, has a history of recurrent UTIs. 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 He is scheduled to get a barium enema outpatient. Discussed with urologist on call, who stated that patient can continue work up outpatient. If he has not had a CT cystogram he could undergo this prior to discharge.   Macrocytic anemia    Latest Ref Rng & Units 03/04/2024    4:48 AM 03/03/2024    2:20 AM 03/02/2024   10:53 AM  CBC  WBC 4.0 - 10.5 K/uL 6.8  6.9  6.2   Hemoglobin 13.0 - 17.0 g/dL 89.7  89.3  89.6   Hematocrit 39.0 - 52.0 % 30.7  32.1  31.4   Platelets 150 - 400 K/uL 172  155  147   Hgb stable.  Iron panel WNL. B12 WNL. Folate WNL. Continue to monitor    Hypothyroidism  TSH 0.259 low a month ago. TSH WNL limits this hospitalization. FT4 elevated 1.32.  Currently on 112mcg daily.  -Will continue current dose given TSH WNL.   Chronic constipation Due to opioid use. Continue senna BID and miralax daily, Will add lactulose BID for two doses.   Chronic pain On chronic opioids, gabapentin, and duloxetine.   Will resume his home medications today.   HLD  On atorvastatin  80mg . Resume on discharge.    TIA  Not on ASA. Family will discuss resuming this with PCP.   COPD  Not on medications. Not in exacerbation.   HFpEF appears compensated   HTN Resume home antihypertensives when able. Will  continue to hold these given normotension.    AAA s/p repair in 2008 noted.    H/o PE not on eliquis noted      Subjective: No issues overnight.   Physical Exam: Vitals:   03/03/24 1601 03/03/24 1922 03/03/24 2336 03/04/24 0349  BP: 111/67 103/67 (!) 103/57 111/64  Pulse: 90 (!) 101 88 80  Resp: 20 20    Temp: 98.7 F (37.1 C) 97.8 F (36.6 C) 98.1 F (36.7 C) 98.3 F (36.8 C)  TempSrc:  Oral  Oral  SpO2: 96% 92% 95% 96%  Weight:    79 kg  Height:           Physical Exam  Constitutional: In no distress.  Cardiovascular: Normal rate, regular  rhythm. Systolic murmur appreciated LUSB.No lower extremity edema  Pulmonary: Non labored breathing on room air, no wheezing or rales.   Abdominal: Soft. Non distended and non tender    Neurological: Alert and oriented to person, place, and time.  Skin: Skin is warm and dry.   Data Reviewed:     Latest Ref Rng & Units 03/04/2024    4:48 AM 03/03/2024    2:20 AM 03/02/2024   10:53 AM  CBC  WBC 4.0 - 10.5 K/uL 6.8  6.9  6.2   Hemoglobin 13.0 - 17.0 g/dL 89.7  89.3  89.6   Hematocrit 39.0 - 52.0 % 30.7  32.1  31.4   Platelets 150 - 400 K/uL 172  155  147       Latest Ref Rng & Units 03/04/2024    4:48 AM 03/03/2024    2:20 AM 03/02/2024    4:38 AM  BMP  Glucose 70 - 99 mg/dL 92  98  88   BUN 8 - 23 mg/dL 19  12  11    Creatinine 0.61 - 1.24 mg/dL 9.29  9.34  9.41   Sodium 135 - 145 mmol/L 142  142  140   Potassium 3.5 - 5.1 mmol/L 4.2  4.5  4.1   Chloride 98 - 111 mmol/L 104  106  106   CO2 22 - 32 mmol/L 28  29  27    Calcium 8.9 - 10.3 mg/dL 9.7  9.7  9.3      Family Communication: Son at bedside.   Disposition: Status is: Inpatient Remains inpatient appropriate because: IV antibiotics   Planned Discharge Destination: Home SNF recommended but son would prefer to care for him at home.     Time spent: 35 minutes  Author: Alban Pepper, MD 03/04/2024 7:41 AM  For on call review [email or web address].

## 2024-03-05 ENCOUNTER — Inpatient Hospital Stay

## 2024-03-05 DIAGNOSIS — R6521 Severe sepsis with septic shock: Secondary | ICD-10-CM | POA: Diagnosis not present

## 2024-03-05 DIAGNOSIS — A419 Sepsis, unspecified organism: Secondary | ICD-10-CM | POA: Diagnosis not present

## 2024-03-05 DIAGNOSIS — N390 Urinary tract infection, site not specified: Secondary | ICD-10-CM | POA: Diagnosis not present

## 2024-03-05 LAB — BASIC METABOLIC PANEL WITH GFR
Anion gap: 6 (ref 5–15)
BUN: 20 mg/dL (ref 8–23)
CO2: 31 mmol/L (ref 22–32)
Calcium: 9.8 mg/dL (ref 8.9–10.3)
Chloride: 103 mmol/L (ref 98–111)
Creatinine, Ser: 0.74 mg/dL (ref 0.61–1.24)
GFR, Estimated: >60 mL/min (ref >60)
Glucose, Bld: 92 mg/dL (ref 70–99)
Potassium: 4.1 mmol/L (ref 3.5–5.1)
Sodium: 140 mmol/L (ref 135–145)

## 2024-03-05 MED ORDER — GABAPENTIN 100 MG PO CAPS
200.0000 mg | ORAL_CAPSULE | Freq: Every day | ORAL | Status: DC
  Administered 2024-03-05: 200 mg via ORAL
  Filled 2024-03-05: qty 2

## 2024-03-05 MED ORDER — IOTHALAMATE MEGLUMINE 17.2 % UR SOLN
250.0000 mL | Freq: Once | URETHRAL | Status: AC | PRN
  Administered 2024-03-05: 250 mL via INTRAVESICAL

## 2024-03-05 MED ORDER — IOHEXOL 300 MG/ML  SOLN
100.0000 mL | Freq: Once | INTRAMUSCULAR | Status: AC | PRN
  Administered 2024-03-05: 100 mL via INTRAVENOUS

## 2024-03-05 NOTE — Progress Notes (Signed)
 Progress Note   Patient: Bryan Howard. FMW:[id number] DOB: 30-Dec-1932 DOA: 02/28/2024     6 DOS: the patient was seen and examined on 03/05/2024   Brief hospital course: Per H&P HPI This is an 89 year old male with a past medical history of recurrent UTIs, AAA, CAD, diverticulosis, esophageal stricture, GERD, hyperlipidemia, h/o PE, h/o prostate cancer s/p cryoablation, MCI, OSA not on CPAP, PUD, TIA, HFpEF, hypothyroidism who presented to the ED via EMS for complaints of increased confusion, low blood pressure and possible UTI.  History is obtained from patient, and patient's records.  He is pleasantly confused.  He states that he was doing fine when he was brought to the emergency room.  Per ED records, patient's caregiver called EMS because he was hypotensive and had changes in mental status and they suspected he might have a UTI.  His ED workup revealed a potassium of 5.9, glucose of 130, BUN of 30, troponin of 4.5, hemoglobin of 10.7, hematocrit of 34.1, and his urine was cloudy with large amounts of leukocytes. He was ruled in for urosepsis and started on IV fluids and empiric antibiotics.  Despite fluid resuscitation patient remained in refractory shock hence requiring vasopressors.  PCCM was called to admit and continue patient's management. He remains on pressors though his requirements are improving with IV fluids.  He is awake and offers no complaints.  He is pleasantly confused and denies pain, shortness of breath dizziness and headache. Has a history of frequent UTIs and his last urinalysis showed ESBL in urine.  He is being covered empirically for ESBL UTI  Assessment and Plan:  Septic shock secondary to UTI, shock physiology resolved Toxic metabolic encephalopathy Resolved.   He is currently on meropenem given his history of ESBL UTI. UCX with ESBL klebsiella pna and E coli.  Patient will complete six days of merrem today. Will stop in the AM.   Of note, has a history of  recurrent UTIs. 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 He is scheduled to get a barium enema outpatient. Discussed with urologist on call, who stated that patient can continue work up outpatient. If he has not had a CT cystogram he could undergo this prior to discharge. Patient undergoing CT cystogram this AM and will discharge in the AM home.   Macrocytic anemia    Latest Ref Rng & Units 03/04/2024    4:48 AM 03/03/2024    2:20 AM 03/02/2024   10:53 AM  CBC  WBC 4.0 - 10.5 K/uL 6.8  6.9  6.2   Hemoglobin 13.0 - 17.0 g/dL 89.7  89.3  89.6   Hematocrit 39.0 - 52.0 % 30.7  32.1  31.4   Platelets 150 - 400 K/uL 172  155  147   Hgb stable.  Iron panel WNL. B12 WNL. Folate WNL. Continue to monitor    Hypothyroidism  TSH 0.259 low a month ago. TSH WNL limits this hospitalization. FT4 elevated 1.32.  Currently on 112mcg daily.  -Will continue current dose given TSH WNL.   Chronic constipation Due to opioid use. Continue senna BID and miralax daily, Will add lactulose BID for two doses.   Chronic pain On chronic opioids, gabapentin, and duloxetine.   Will resume his home medications today.   HLD  On atorvastatin  80mg . Resume on discharge.    TIA  Not on ASA. Family will discuss resuming this with PCP.  COPD  Not on medications. Not in exacerbation.   HFpEF appears compensated   HTN Resume home antihypertensives when able. Will continue to hold these given normotension.    AAA s/p repair in 2008 noted.    H/o PE not on eliquis noted      Subjective: No issues overnight.  Physical Exam: Vitals:   03/04/24 1938 03/04/24 2348 03/05/24 0445 03/05/24 0751  BP: 122/70 124/76 120/70 125/74  Pulse: 82 83 85 87  Resp: 18 18 18 18   Temp: 98.5 F (36.9 C) 98.6 F (37 C) 98.7 F (37.1 C) 97.6 F (36.4 C)  TempSrc: Oral Oral Oral Oral  SpO2: 94% 93% 92% 95%  Weight:    73.6 kg   Height:          Physical Exam  Constitutional: In no distress.  Cardiovascular: Normal rate, regular rhythm. Systolic murmur. No lower extremity edema  Pulmonary: Non labored breathing on room air, no wheezing or rales.   Abdominal: Soft. Non distended and non tender Neurological: Alert and oriented to person, place, and time.  Skin: Skin is warm and dry.     Data Reviewed:     Latest Ref Rng & Units 03/04/2024    4:48 AM 03/03/2024    2:20 AM 03/02/2024   10:53 AM  CBC  WBC 4.0 - 10.5 K/uL 6.8  6.9  6.2   Hemoglobin 13.0 - 17.0 g/dL 89.7  89.3  89.6   Hematocrit 39.0 - 52.0 % 30.7  32.1  31.4   Platelets 150 - 400 K/uL 172  155  147       Latest Ref Rng & Units 03/05/2024    7:27 AM 03/04/2024    4:48 AM 03/03/2024    2:20 AM  BMP  Glucose 70 - 99 mg/dL 92  92  98   BUN 8 - 23 mg/dL 20  19  12    Creatinine 0.61 - 1.24 mg/dL 9.25  9.29  9.34   Sodium 135 - 145 mmol/L 140  142  142   Potassium 3.5 - 5.1 mmol/L 4.1  4.2  4.5   Chloride 98 - 111 mmol/L 103  104  106   CO2 22 - 32 mmol/L 31  28  29    Calcium 8.9 - 10.3 mg/dL 9.8  9.7  9.7      Family Communication: Son at bedside.   Disposition: Status is: Inpatient Remains inpatient appropriate because: IV antibiotics, further work up of recurrent UTIs  Planned Discharge Destination: Home SNF recommended but son would prefer to care for him at home.     Time spent: 35 minutes  Author: Alban Pepper, MD 03/05/2024 3:51 PM  For on call review [email or web address].

## 2024-03-05 NOTE — Plan of Care (Signed)
  Problem: Education: Goal: Knowledge of General Education information will improve Description: Including pain rating scale, medication(s)/side effects and non-pharmacologic comfort measures Outcome: Progressing   Problem: Health Behavior/Discharge Planning: Goal: Ability to manage health-related needs will improve Outcome: Progressing   Problem: Clinical Measurements: Goal: Diagnostic test results will improve Outcome: Progressing   Problem: Activity: Goal: Risk for activity intolerance will decrease Outcome: Progressing   Problem: Nutrition: Goal: Adequate nutrition will be maintained Outcome: Progressing   Problem: Elimination: Goal: Will not experience complications related to urinary retention Outcome: Progressing   Problem: Pain Managment: Goal: General experience of comfort will improve and/or be controlled Outcome: Progressing   Problem: Safety: Goal: Ability to remain free from injury will improve Outcome: Progressing

## 2024-03-05 NOTE — Progress Notes (Signed)
 Physical Therapy Treatment Patient Details Name: Bryan Howard. MRN: [id number] DOB: Dec 27, 1932 Today's Date: 03/05/2024   History of Present Illness 89 year old male PMH of recurrent UTIs, AAA, CAD, diverticulosis, esophageal stricture, GERD, hyperlipidemia, hyperthyroidism and hypothyroidism who presented to the ED via EMS for complaints of increased confusion, low blood pressure and possible UTI. Has a history of frequent UTIs and his last urinalysis showed ESBL in urine. MD dx includes septic shock, lactic acidosis, hx of CAD, hx of HTN.    PT Comments  Patient sliding towards front of recliner on arrival and requesting to return to bed. Stood from medical illustrator with minA then ambulated short distance to bed with RW and min-modA for RW management and frequent cueing to maintain close proximity to RW. Patient demonstrates very flexed trunk and inability to correct despite tactile and verbal cueing. Upon sitting on EOB, patient requesting to have BM. Able to transfer to/from [facility] with minA and RW. Required totalA for pericare after successful BM. Patient with difficulty following commands throughout session. Unsure if it is due to [facility] or cognition. Discharge plan remains appropriate.    If plan is discharge home, recommend the following: A lot of help with bathing/dressing/bathroom;Assist for transportation;Supervision due to cognitive status;A lot of help with walking and/or transfers;Direct supervision/assist for medications management;Help with stairs or ramp for entrance   Can travel by private vehicle     Yes  Equipment Recommendations  Other (comment) (TBD at next venue)    Recommendations for Other Services       Precautions / Restrictions Precautions Precautions: Fall Recall of Precautions/Restrictions: Impaired Restrictions Weight Bearing Restrictions Per Provider Order: No     Mobility  Bed Mobility Overal bed mobility: Needs Assistance Bed Mobility: Sit to Supine        Sit to supine: Min assist   General bed mobility comments: assist for returning BLE back into bed    Transfers Overall transfer level: Needs assistance Equipment used: Rolling Jeannifer Drakeford (2 wheels) Transfers: Sit to/from Stand Sit to Stand: Min assist   Step pivot transfers: Min assist       General transfer comment: assist to stand from recliner  (ambulated to bed 4-5') then requesting to utilize [facility]. Performed step pivot transfer with RW and minA for step by step cueing and assist with RW as well as balance    Ambulation/Gait Ambulation/Gait assistance: Min assist, Mod assist Gait Distance (Feet): 5 Feet Assistive device: Rolling Queenie Aufiero (2 wheels) Gait Pattern/deviations: Step-to pattern, Decreased stride length, Trunk flexed Gait velocity: decreased     General Gait Details: ambulated very short distance to bed. Very flexed trunk with RW far out in front of him. Cues and assist to bring RW back to him with poor follow through.   Stairs             Wheelchair Mobility     Tilt Bed    Modified Rankin (Stroke Patients Only)       Balance Overall balance assessment: Needs assistance Sitting-balance support: Feet supported, Bilateral upper extremity supported Sitting balance-Leahy Scale: Fair     Standing balance support: Bilateral upper extremity supported, During functional activity, Reliant on assistive device for balance Standing balance-Leahy Scale: Poor                              Communication Communication Communication: Impaired Factors Affecting Communication: Hearing impaired  Cognition Arousal: Alert Behavior During Therapy: WFL for tasks assessed/performed  PT - Cognitive impairments: Initiation, Safety/Judgement, Memory, Attention, Problem solving, No family/caregiver present to determine baseline                       PT - Cognition Comments: increased time for processing of task, short-term memory  deficits Following commands: Impaired Following commands impaired: Follows one step commands with increased time    Cueing Cueing Techniques: Verbal cues, Tactile cues, Visual cues  Exercises      General Comments        Pertinent Vitals/Pain Pain Assessment Pain Assessment: No/denies pain    Home Living                          Prior Function            PT Goals (current goals can now be found in the care plan section) Acute Rehab PT Goals Patient Stated Goal: none stated PT Goal Formulation: With patient Time For Goal Achievement: 03/15/24 Potential to Achieve Goals: Good Progress towards PT goals: Progressing toward goals    Frequency    Min 2X/week      PT Plan      Co-evaluation              AM-PAC PT 6 Clicks Mobility   Outcome Measure  Help needed turning from your back to your side while in a flat bed without using bedrails?: A Lot Help needed moving from lying on your back to sitting on the side of a flat bed without using bedrails?: A Lot Help needed moving to and from a bed to a chair (including a wheelchair)?: A Little Help needed standing up from a chair using your arms (e.g., wheelchair or bedside chair)?: A Little Help needed to walk in hospital room?: A Lot Help needed climbing 3-5 steps with a railing? : Total 6 Click Score: 13    End of Session Equipment Utilized During Treatment: Gait belt Activity Tolerance: Patient tolerated treatment well Patient left: in bed;with call bell/phone within reach;with bed alarm set Nurse Communication: Mobility status PT Visit Diagnosis: Unsteadiness on feet (R26.81);Muscle weakness (generalized) (M62.81);Difficulty in walking, not elsewhere classified (R26.2)     Time: 0937-1000 PT Time Calculation (min) (ACUTE ONLY): 23 min  Charges:    $Therapeutic Activity: 23-37 mins PT General Charges $$ ACUTE PT VISIT: 1 Visit                     Maryanne Finder, PT, DPT Physical  Therapist - [facility]  [facility]    Tabria Steines A Ebert Forrester 03/05/2024, 12:50 PM

## 2024-03-06 DIAGNOSIS — R6521 Severe sepsis with septic shock: Secondary | ICD-10-CM | POA: Diagnosis not present

## 2024-03-06 DIAGNOSIS — A419 Sepsis, unspecified organism: Secondary | ICD-10-CM | POA: Diagnosis not present

## 2024-03-06 LAB — BASIC METABOLIC PANEL WITH GFR
Anion gap: 9 (ref 5–15)
BUN: 21 mg/dL (ref 8–23)
CO2: 27 mmol/L (ref 22–32)
Calcium: 10.2 mg/dL (ref 8.9–10.3)
Chloride: 102 mmol/L (ref 98–111)
Creatinine, Ser: 0.82 mg/dL (ref 0.61–1.24)
GFR, Estimated: >60 mL/min (ref >60)
Glucose, Bld: 102 mg/dL — ABNORMAL HIGH (ref 70–99)
Potassium: 4.4 mmol/L (ref 3.5–5.1)
Sodium: 138 mmol/L (ref 135–145)

## 2024-03-06 LAB — CBC
HCT: 32.9 % — ABNORMAL LOW (ref 39.0–52.0)
Hemoglobin: 10.5 g/dL — ABNORMAL LOW (ref 13.0–17.0)
MCH: 32.8 pg (ref 26.0–34.0)
MCHC: 31.9 g/dL (ref 30.0–36.0)
MCV: 102.8 fL — ABNORMAL HIGH (ref 80.0–100.0)
Platelets: 195 K/uL (ref 150–400)
RBC: 3.2 MIL/uL — ABNORMAL LOW (ref 4.22–5.81)
RDW: 14.6 % (ref 11.5–15.5)
WBC: 7.6 K/uL (ref 4.0–10.5)
nRBC: 0 % (ref 0.0–0.2)

## 2024-03-06 NOTE — Discharge Instructions (Addendum)
 Please follow-up with your urologist at [facility] as previously scheduled.   Please follow-up with your primary care physician as well.   Please stop taking your losartan on discharge, and discuss with your primary care physician when to resume this.  This medication is being held because you had low blood pressures to low normal blood pressures while you are in the hospital.

## 2024-03-06 NOTE — Discharge Summary (Signed)
 Physician Discharge Summary   Patient: Bryan Howard. MRN: [id number] DOB: 02-27-1933  Admit date:     02/28/2024  Discharge date: 03/06/24  Discharge Physician: Alban Pepper   PCP: Jyl Railing, MD   Recommendations at discharge:    F/u with outpatient urologist  F/u with PCP to decide if and when antihypertensives should be resumed.  F/u with PCP for further titration of synthroid.  F/u PCP to determine if aspirin should be resumed given h/o TIA  Discharge Diagnoses: Principal Problem:   Septic shock (HCC) Active Problems:   Malnutrition of moderate degree  Resolved Problems:   * No resolved hospital problems. *  Hospital Course: Per H&P HPI This is an 89 year old male with a past medical history of recurrent UTIs, AAA, CAD, diverticulosis, esophageal stricture, GERD, hyperlipidemia, h/o PE, h/o prostate cancer s/p cryoablation, MCI, OSA not on CPAP, PUD, TIA, HFpEF, hypothyroidism who presented to the ED via EMS for complaints of increased confusion, low blood pressure and possible UTI.  History is obtained from patient, and patient's records.  He is pleasantly confused.  He states that he was doing fine when he was brought to the emergency room.  Per ED records, patient's caregiver called EMS because he was hypotensive and had changes in mental status and they suspected he might have a UTI.  His ED workup revealed a potassium of 5.9, glucose of 130, BUN of 30, troponin of 4.5, hemoglobin of 10.7, hematocrit of 34.1, and his urine was cloudy with large amounts of leukocytes.   He was ruled in for sepsis secondary to urinary tract infection and started on IV fluids and empiric antibiotics.  Despite fluid resuscitation patient remained in refractory shock hence requiring vasopressors.  PCCM was called to admit and continue patient's management. He remains on pressors though his requirements are improving with IV fluids.  H his pressors were stopped 11/12 and patient was  transferred to the floor 11/13.  His blood pressure remained stable and his mentation improved on continued meropenem therapy which she received a total of 6 days.  Patient did undergo a CT cystogram this hospitalization in order to see if he had a fistula between his urinary bladder and GI tract.  This test was negative for fistula.  Patient will discharge with his previously scheduled follow-up with [facility] urology.   Assessment and Plan: Septic shock secondary to UTI, shock physiology resolved Toxic metabolic encephalopathy Resolved.  Patient initially required pressors that were stopped 11/12.  He was on meropenem given his history of urine cultures with ESBL.  Urine cultures this hospitalization did grow ESBL Klebsiella.  E. coli was also in his urinary cultures.  He completed a 6-day course of meropenem.   Of note, has a history of recurrent UTIs. 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 He is scheduled to get a barium enema outpatient. Discussed with urologist on call, who stated that patient can continue work up outpatient. If he has not had a CT cystogram he could undergo this prior to discharge. Patient underwent this study and it showed no evidence of a fistula between his urinary bladder and GI tract.   Macrocytic anemia Hemoglobin was stable.  Iron panel was within normal limits, B12 within normal limits, folate within normal limits. Can continue further work up outpatient.    Hypothyroidism TSH 0.259 (low) a month ago.  TSH within normal limits this hospitalization,  T4 elevated at 1.32.  Patient takes 112 mcg daily.  This dose was continued given his TSH was within normal limits.  Chronic constipation In the setting of chronic opioid use.  He had 2 bowel movements the day prior to discharge.  He will continue his home bowel regimen.  Chronic pain Patient is on chronic opioids,  gabapentin, and duloxetine. All of his medications were resumed once his mental status returned to baseline.  HTN He is on many hypertensives were held given his low to low normal blood pressures.  He will need follow-up with his primary care physician to determine when to resume these.  COPD Exacerbation  HFpEF Not in exacerbation  Hyperlipidemia Statin was resumed on discharge  History of TIA Not on aspirin, family will discuss with PCP if and when that should be resumed       Consultants: CCM Procedures performed: NOne  Disposition: Home PT OT evaluated patient recommending SNF, patient's son states that they have a caregiver and he would like his dad to go home. Diet recommendation:  Regular diet DISCHARGE MEDICATION: Allergies as of 03/06/2024       Reactions   Captopril Cough   Quinolones Other (See Comments)   AAA patient- avoid if possible        Medication List     PAUSE taking these medications    furosemide 40 MG tablet Wait to take this until your doctor or other care provider tells you to start again. Commonly known as: LASIX Take 40 mg by mouth.   linezolid 600 MG tablet Wait to take this until your doctor or other care provider tells you to start again. Commonly known as: ZYVOX Take 600 mg by mouth 2 (two) times daily.   losartan 50 MG tablet Wait to take this until your doctor or other care provider tells you to start again. Commonly known as: COZAAR Take 50 mg by mouth daily.   phenazopyridine 100 MG tablet Wait to take this until your doctor or other care provider tells you to start again. Commonly known as: PYRIDIUM Take 100 mg by mouth 3 (three) times daily as needed.   senna 8.6 MG tablet Wait to take this until your doctor or other care provider tells you to start again. Commonly known as: SENOKOT Take 1 tablet by mouth 2 (two) times daily.       TAKE these medications    acetaminophen 650 MG CR tablet Commonly known as:  TYLENOL Take one tablet every morning and two tablets nightly   Alpha-Lipoic Acid 200 MG Caps Take 200 mg by mouth daily.   ascorbic acid 500 MG tablet Commonly known as: VITAMIN C Take 500 mg by mouth 2 (two) times daily.   atorvastatin 80 MG tablet Commonly known as: LIPITOR Take 80 mg by mouth daily.   celecoxib 200 MG capsule Commonly known as: CELEBREX Take 200 mg by mouth.   Cholecalciferol 25 MCG (1000 UT) tablet Take 1,000 Units by mouth daily.   Coenzyme Q10 100 MG capsule Take 200 mg by mouth.   [employer] VITAMIN B12 1000 MCG tablet Generic drug: cyanocobalamin Take 1,000 mcg by mouth daily.   DULoxetine HCl 30 MG Csdr Take 30 mg by mouth daily.   folic acid 800 MCG tablet Commonly known as: FOLVITE Take 800 mcg by mouth at bedtime.   gabapentin 300 MG capsule Commonly known as: NEURONTIN Take 300 mg by mouth at bedtime.   HYDROcodone-acetaminophen 7.5-325 MG tablet Commonly known as: NORCO  Take 1 tablet by mouth 2 (two) times daily as needed for moderate pain (pain score 4-6) or severe pain (pain score 7-10).   levothyroxine 112 MCG tablet Commonly known as: SYNTHROID Take 112 mcg by mouth daily before breakfast.   naloxone 4 MG/0.1ML Liqd nasal spray kit Commonly known as: NARCAN Place 1 spray into the nose once as needed.   omeprazole 20 MG capsule Commonly known as: PRILOSEC Take 20 mg by mouth 2 (two) times daily as needed.   polyethylene glycol 17 g packet Commonly known as: MIRALAX / GLYCOLAX Take 17 g by mouth daily as needed for mild constipation or moderate constipation.   sennosides-docusate sodium 8.6-50 MG tablet Commonly known as: SENOKOT-S Take 1 tablet by mouth daily.        Follow-up Information     Jyl Railing, MD Follow up.   Specialty: Family Medicine Why: hospital follow up Contact information: [address] [place] [address] [phone]                Discharge Exam: Fredricka Weights   03/04/24  0349 03/05/24 0445 03/06/24 0500  Weight: 79 kg 73.6 kg 73.5 kg   Physical Exam  Constitutional: In no distress.  Cardiovascular: Normal rate, regular rhythm. No lower extremity edema  Pulmonary: Non labored breathing on room air, no wheezing or rales.   Abdominal: Soft. Non distended and non tender   Neurological: Alert and oriented to person, place, and time.  Skin: Skin is warm and dry.    Condition at discharge: stable  The results of significant diagnostics from this hospitalization (including imaging, microbiology, ancillary and laboratory) are listed below for reference.   Imaging Studies: CT CYSTOGRAM ABD/PELVIS Result Date: 03/06/2024 CLINICAL DATA:  concern for fistula from bladder to gi tract EXAM: CT CYSTOGRAM (CT ABDOMEN AND PELVIS WITH CONTRAST) TECHNIQUE: Multi-detector CT imaging through the abdomen and pelvis was performed after dilute contrast had been introduced into the bladder for the purposes of performing CT cystography. RADIATION DOSE REDUCTION: This exam was performed according to the departmental dose-optimization program which includes automated exposure control, adjustment of the mA and/or kV according to patient size and/or use of iterative reconstruction technique. CONTRAST:  100mL OMNIPAQUE IOHEXOL 300 MG/ML  SOLN COMPARISON:  Abdominal radiographs 02/07/2024. Renal ultrasound 01/27/2024. No comparison CT. FINDINGS: Lower chest: The heart is enlarged and there is atherosclerosis of the aorta and coronary arteries. There are calcifications of the aortic valve. Mild atelectasis or scarring both lung bases without confluent airspace disease. Hepatobiliary: Probable tiny cyst inferiorly in the right hepatic lobe adjacent to the gallbladder. No suspicious liver lesions are identified. There are multiple calcified gallstones. No gallbladder wall thickening, surrounding inflammation or significant biliary dilatation. Pancreas: Atrophy. No ductal dilatation, focal  abnormality or surrounding inflammation. Spleen: There is a lobulated cyst centrally in the spleen, measuring 2.7 x 1.7 cm. No splenomegaly or suspicious splenic lesion. Adrenals/Urinary Tract: Both adrenal glands appear normal. There are small nonobstructing calculi in the interpolar region of the right kidney and lower pole of the left kidney. No evidence of ureteral calculus, hydronephrosis or perinephric soft tissue stranding. There are small right renal cysts measuring up to 1.5 cm in diameter for which no specific follow-up imaging is recommended. No suspicious renal findings. The bladder appears adequately distended with the contrast. The bladder is thick-walled with mild trabeculation. No leakage of contrast from the bladder lumen is demonstrated. Specifically, no bowel opacification is seen. Stomach/Bowel: No enteric contrast administered. The stomach  appears unremarkable for its degree of distention. There are diverticular changes throughout the descending and sigmoid colon with mild wall thickening, but no surrounding inflammation. No small bowel abnormalities are identified. Vascular/Lymphatic: There are no enlarged abdominal or pelvic lymph nodes. Aortic and branch vessel atherosclerosis post aorto bi iliac endograft stenting. Reproductive: Central prostatic calcifications. Other: Right inguinal hernia containing fat. No ascites or pneumoperitoneum. Musculoskeletal: No acute or significant osseous findings. Multi spondylosis associated with a mild convex left thoracolumbar scoliosis. Degenerative changes of both hips. IMPRESSION: 1. No evidence of bladder leak or fistula. 2. Bladder wall thickening with mild trabeculation, likely due to chronic bladder outlet obstruction. 3. Nonobstructing bilateral renal calculi. 4. Cholelithiasis without evidence of cholecystitis or biliary dilatation. 5. Distal colonic diverticulosis without evidence of acute inflammation. 6.  Aortic Atherosclerosis (ICD10-I70.0).  Electronically Signed   By: Elsie Perone M.D.   On: 03/06/2024 08:50   ECHOCARDIOGRAM COMPLETE Result Date: 02/29/2024    ECHOCARDIOGRAM REPORT   Patient Name:   Bryan Howard. Date of Exam: 02/29/2024 Medical Rec #:  [id number]          Height:       65.0 in Accession #:    [id number]         Weight:       165.6 lb Date of Birth:  1933-01-01          BSA:          1.825 m Patient Age:    89 years           BP:           126/47 mmHg Patient Gender: M                  HR:           64 bpm. Exam Location:  [facility] Procedure: 2D Echo, Cardiac Doppler, Color Doppler and Intracardiac            Opacification Agent (Both Spectral and Color Flow Doppler were            utilized during procedure). Indications:     Shock R57.9  History:         Patient has no prior history of Echocardiogram examinations.  Sonographer:     Ashley McNeely-Sloane Referring Phys:  [id number] MAGDALENE S TUKOV-YUAL Diagnosing Phys: Evalene Lunger MD IMPRESSIONS  1. Left ventricular ejection fraction, by estimation, is 60 to 65%. The left ventricle has normal function. The left ventricle has no regional wall motion abnormalities. Left ventricular diastolic parameters are consistent with Grade II diastolic dysfunction (pseudonormalization).  2. Right ventricular systolic function is normal. The right ventricular size is normal.  3. The mitral valve is normal in structure. No evidence of mitral valve regurgitation. No evidence of mitral stenosis.  4. The aortic valve is normal in structure. Aortic valve regurgitation is not visualized. Aortic valve sclerosis/calcification is present, without any evidence of aortic stenosis.  5. The inferior vena cava is normal in size with greater than 50% respiratory variability, suggesting right atrial pressure of 3 mmHg. FINDINGS  Left Ventricle: Left ventricular ejection fraction, by estimation, is 60 to 65%. The left ventricle has normal function. The left ventricle has no regional wall motion  abnormalities. Definity contrast agent was given IV to delineate the left ventricular  endocardial borders. Strain was performed and the global longitudinal strain is indeterminate. The left ventricular internal cavity size was normal in size. There is no left ventricular hypertrophy.  Left ventricular diastolic parameters are consistent with Grade II diastolic dysfunction (pseudonormalization). Right Ventricle: The right ventricular size is normal. No increase in right ventricular wall thickness. Right ventricular systolic function is normal. Left Atrium: Left atrial size was normal in size. Right Atrium: Right atrial size was normal in size. Pericardium: There is no evidence of pericardial effusion. Mitral Valve: The mitral valve is normal in structure. No evidence of mitral valve regurgitation. No evidence of mitral valve stenosis. MV peak gradient, 7.4 mmHg. The mean mitral valve gradient is 3.0 mmHg. Tricuspid Valve: The tricuspid valve is normal in structure. Tricuspid valve regurgitation is not demonstrated. No evidence of tricuspid stenosis. Aortic Valve: The aortic valve is normal in structure. Aortic valve regurgitation is not visualized. Aortic valve sclerosis/calcification is present, without any evidence of aortic stenosis. Aortic valve mean gradient measures 19.0 mmHg. Aortic valve peak gradient measures 33.5 mmHg. Aortic valve area, by VTI measures 1.72 cm. Pulmonic Valve: The pulmonic valve was normal in structure. Pulmonic valve regurgitation is not visualized. No evidence of pulmonic stenosis. Aorta: The aortic root is normal in size and structure. Venous: The inferior vena cava is normal in size with greater than 50% respiratory variability, suggesting right atrial pressure of 3 mmHg. IAS/Shunts: No atrial level shunt detected by color flow Doppler. Additional Comments: 3D was performed not requiring image post processing on an independent workstation and was indeterminate.  LEFT VENTRICLE PLAX 2D  LVIDd:         4.40 cm      Diastology LVIDs:         2.90 cm      LV e' medial:    4.68 cm/s LV PW:         1.60 cm      LV E/e' medial:  28.8 LV IVS:        1.10 cm      LV e' lateral:   7.18 cm/s LVOT diam:     2.40 cm      LV E/e' lateral: 18.8 LV SV:         106 LV SV Index:   58 LVOT Area:     4.52 cm  LV Volumes (MOD) LV vol d, MOD A2C: 52.1 ml LV vol d, MOD A4C: 105.0 ml LV vol s, MOD A2C: 21.2 ml LV vol s, MOD A4C: 29.8 ml LV SV MOD A2C:     30.9 ml LV SV MOD A4C:     105.0 ml LV SV MOD BP:      54.6 ml RIGHT VENTRICLE RV S prime:     12.40 cm/s  PULMONARY VEINS TAPSE (M-mode): 2.0 cm      A Reversal Duration: 222.00 msec                             A Reversal Velocity: 21.90 cm/s                             Diastolic Velocity:  27.00 cm/s                             S/D Velocity:        1.70                             Systolic Velocity:  46.40 cm/s LEFT ATRIUM             Index        RIGHT ATRIUM           Index LA diam:        4.10 cm 2.25 cm/m   RA Area:     21.80 cm LA Vol (A2C):   56.7 ml 31.06 ml/m  RA Volume:   70.70 ml  38.73 ml/m LA Vol (A4C):   55.4 ml 30.35 ml/m LA Biplane Vol: 58.5 ml 32.05 ml/m  AORTIC VALVE AV Area (Vmax):    1.75 cm AV Area (Vmean):   1.66 cm AV Area (VTI):     1.72 cm AV Vmax:           289.50 cm/s AV Vmean:          197.000 cm/s AV VTI:            0.620 m AV Peak Grad:      33.5 mmHg AV Mean Grad:      19.0 mmHg LVOT Vmax:         112.00 cm/s LVOT Vmean:        72.200 cm/s LVOT VTI:          0.235 m LVOT/AV VTI ratio: 0.38  AORTA Ao Root diam: 3.30 cm MITRAL VALVE MV Area (PHT): 3.12 cm     SHUNTS MV Area VTI:   2.29 cm     Systemic VTI:  0.24 m MV Peak grad:  7.4 mmHg     Systemic Diam: 2.40 cm MV Mean grad:  3.0 mmHg MV Vmax:       1.36 m/s MV Vmean:      76.6 cm/s MV Decel Time: 243 msec MV E velocity: 135.00 cm/s MV A velocity: 114.00 cm/s MV E/A ratio:  1.18 Evalene Lunger MD Electronically signed by Evalene Lunger MD Signature Date/Time:  02/29/2024/5:03:46 PM    Final    DG Chest Port 1 View Result Date: 02/28/2024 EXAM: 1 VIEW(S) XRAY OF THE CHEST 02/28/2024 03:19:00 PM COMPARISON: 01/17/2024 CLINICAL HISTORY: Questionable sepsis - evaluate for abnormality. FINDINGS: LUNGS AND PLEURA: No focal pulmonary opacity. No pulmonary edema. No pleural effusion. No pneumothorax. HEART AND MEDIASTINUM: Stable cardiomediastinal silhouette compared to the prior study of 01/17/2024. BONES AND SOFT TISSUES: No acute osseous abnormality. IMPRESSION: 1. No acute cardiopulmonary process. Electronically signed by: Lynwood Seip MD 02/28/2024 03:25 PM EST RP Workstation: [id number]   DG Abdomen 1 View Result Date: 02/07/2024 CLINICAL DATA:  History of constipation for 1 week EXAM: ABDOMEN - 1 VIEW COMPARISON:  None Available. FINDINGS: Scattered large and small bowel gas is noted. Some retained fecal material is noted consistent with a moderate degree of constipation. This is prominent in the right colon as well as in the rectal vault. Changes of prior aortic stent graft therapy are seen. No abnormal mass or abnormal calcifications are seen. Degenerative changes of lumbar spine are noted. IMPRESSION: Moderate colonic constipation. Electronically Signed   By: Oneil Devonshire M.D.   On: 02/07/2024 22:57    Microbiology: Results for orders placed or performed during the hospital encounter of 02/28/24  Blood Culture (routine x 2)     Status: Abnormal   Collection Time: 02/28/24  3:27 PM   Specimen: BLOOD  Result Value Ref Range Status   Specimen Description   Final    BLOOD LEFT ANTECUBITAL Performed at [facility], [address], [place], [place] [address]  Special Requests   Final    BOTTLES DRAWN AEROBIC AND ANAEROBIC Blood Culture adequate volume Performed at [facility], [address] [place], [place] [address]    Culture  Setup Time   Final    GRAM POSITIVE COCCI AEROBIC BOTTLE ONLY CRITICAL RESULT CALLED TO, READ  BACK BY AND VERIFIED WITH: WILL ANDERSON PHARMD [id number] 02/29/24 HNM    Culture (A)  Final    STAPHYLOCOCCUS HOMINIS THE SIGNIFICANCE OF ISOLATING THIS ORGANISM FROM A SINGLE SET OF BLOOD CULTURES WHEN MULTIPLE SETS ARE DRAWN IS UNCERTAIN. PLEASE NOTIFY THE MICROBIOLOGY DEPARTMENT WITHIN ONE WEEK IF SPECIATION AND SENSITIVITIES ARE REQUIRED. Performed at [facility], [address], [place], [place] [address]    Report Status 03/02/2024 FINAL  Final  Blood Culture (routine x 2)     Status: None   Collection Time: 02/28/24  3:27 PM   Specimen: BLOOD  Result Value Ref Range Status   Specimen Description BLOOD BLOOD RIGHT FOREARM  Final   Special Requests   Final    BOTTLES DRAWN AEROBIC AND ANAEROBIC Blood Culture adequate volume   Culture   Final    NO GROWTH 5 DAYS Performed at [facility], [address] [place], [place] [address]    Report Status 03/04/2024 FINAL  Final  Blood Culture ID Panel (Reflexed)     Status: Abnormal   Collection Time: 02/28/24  3:27 PM  Result Value Ref Range Status   Enterococcus faecalis NOT DETECTED NOT DETECTED Final   Enterococcus Faecium NOT DETECTED NOT DETECTED Final   Listeria monocytogenes NOT DETECTED NOT DETECTED Final   Staphylococcus species DETECTED (A) NOT DETECTED Final    Comment: CRITICAL RESULT CALLED TO, READ BACK BY AND VERIFIED WITH: WILL LENON PHARMD [id number] 02/29/24 HNM    Staphylococcus aureus (BCID) NOT DETECTED NOT DETECTED Final   Staphylococcus epidermidis NOT DETECTED NOT DETECTED Final   Staphylococcus lugdunensis NOT DETECTED NOT DETECTED Final   Streptococcus species NOT DETECTED NOT DETECTED Final   Streptococcus agalactiae NOT DETECTED NOT DETECTED Final   Streptococcus pneumoniae NOT DETECTED NOT DETECTED Final   Streptococcus pyogenes NOT DETECTED NOT DETECTED Final   A.calcoaceticus-baumannii NOT DETECTED NOT DETECTED Final   Bacteroides fragilis NOT DETECTED NOT DETECTED Final   Enterobacterales  NOT DETECTED NOT DETECTED Final   Enterobacter cloacae complex NOT DETECTED NOT DETECTED Final   Escherichia coli NOT DETECTED NOT DETECTED Final   Klebsiella aerogenes NOT DETECTED NOT DETECTED Final   Klebsiella oxytoca NOT DETECTED NOT DETECTED Final   Klebsiella pneumoniae NOT DETECTED NOT DETECTED Final   Proteus species NOT DETECTED NOT DETECTED Final   Salmonella species NOT DETECTED NOT DETECTED Final   Serratia marcescens NOT DETECTED NOT DETECTED Final   Haemophilus influenzae NOT DETECTED NOT DETECTED Final   Neisseria meningitidis NOT DETECTED NOT DETECTED Final   Pseudomonas aeruginosa NOT DETECTED NOT DETECTED Final   Stenotrophomonas maltophilia NOT DETECTED NOT DETECTED Final   Candida albicans NOT DETECTED NOT DETECTED Final   Candida auris NOT DETECTED NOT DETECTED Final   Candida glabrata NOT DETECTED NOT DETECTED Final   Candida krusei NOT DETECTED NOT DETECTED Final   Candida parapsilosis NOT DETECTED NOT DETECTED Final   Candida tropicalis NOT DETECTED NOT DETECTED Final   Cryptococcus neoformans/gattii NOT DETECTED NOT DETECTED Final    Comment: Performed at [facility], [address], [place], [place] [address]  Urine Culture     Status: Abnormal   Collection Time:  02/28/24  5:42 PM   Specimen: Urine, Random  Result Value Ref Range Status   Specimen Description   Final    URINE, RANDOM Performed at [facility], [address] [place], [place] [address]    Special Requests   Final    NONE Reflexed from [id number] Performed at [facility], [address] [place], [place] [address]    Culture (A)  Final    >=100,000 COLONIES/mL ESCHERICHIA COLI >=100,000 COLONIES/mL KLEBSIELLA PNEUMONIAE Confirmed Extended Spectrum Beta-Lactamase Producer (ESBL).  In bloodstream infections from ESBL organisms, carbapenems are preferred over piperacillin/tazobactam. They are shown to have a lower risk of mortality.    Report Status 03/04/2024  FINAL  Final   Organism ID, Bacteria ESCHERICHIA COLI (A)  Final   Organism ID, Bacteria KLEBSIELLA PNEUMONIAE (A)  Final      Susceptibility   Escherichia coli - MIC*    AMPICILLIN >=32 RESISTANT Resistant     CEFAZOLIN (URINE) Value in next row Sensitive      4 SENSITIVEThis is a modified FDA-approved test that has been validated and its performance characteristics determined by the reporting laboratory.  This laboratory is certified under the Clinical Laboratory Improvement Amendments CLIA as qualified to perform high complexity clinical laboratory testing.    CEFEPIME  Value in next row Sensitive      4 SENSITIVEThis is a modified FDA-approved test that has been validated and its performance characteristics determined by the reporting laboratory.  This laboratory is certified under the Clinical Laboratory Improvement Amendments CLIA as qualified to perform high complexity clinical laboratory testing.    ERTAPENEM Value in next row Sensitive      4 SENSITIVEThis is a modified FDA-approved test that has been validated and its performance characteristics determined by the reporting laboratory.  This laboratory is certified under the Clinical Laboratory Improvement Amendments CLIA as qualified to perform high complexity clinical laboratory testing.    CEFTRIAXONE  Value in next row Sensitive      4 SENSITIVEThis is a modified FDA-approved test that has been validated and its performance characteristics determined by the reporting laboratory.  This laboratory is certified under the Clinical Laboratory Improvement Amendments CLIA as qualified to perform high complexity clinical laboratory testing.    CIPROFLOXACIN Value in next row Sensitive      4 SENSITIVEThis is a modified FDA-approved test that has been validated and its performance characteristics determined by the reporting laboratory.  This laboratory is certified under the Clinical Laboratory Improvement Amendments CLIA as qualified to perform  high complexity clinical laboratory testing.    GENTAMICIN Value in next row Sensitive      4 SENSITIVEThis is a modified FDA-approved test that has been validated and its performance characteristics determined by the reporting laboratory.  This laboratory is certified under the Clinical Laboratory Improvement Amendments CLIA as qualified to perform high complexity clinical laboratory testing.    NITROFURANTOIN Value in next row Sensitive      4 SENSITIVEThis is a modified FDA-approved test that has been validated and its performance characteristics determined by the reporting laboratory.  This laboratory is certified under the Clinical Laboratory Improvement Amendments CLIA as qualified to perform high complexity clinical laboratory testing.    TRIMETH/SULFA Value in next row Sensitive      4 SENSITIVEThis is a modified FDA-approved test that has been validated and its performance characteristics determined by the reporting laboratory.  This laboratory is certified under the Clinical Laboratory Improvement Amendments CLIA as qualified to perform high  complexity clinical laboratory testing.    AMPICILLIN/SULBACTAM Value in next row Resistant      4 SENSITIVEThis is a modified FDA-approved test that has been validated and its performance characteristics determined by the reporting laboratory.  This laboratory is certified under the Clinical Laboratory Improvement Amendments CLIA as qualified to perform high complexity clinical laboratory testing.    PIP/TAZO Value in next row Sensitive      <=4 SENSITIVEThis is a modified FDA-approved test that has been validated and its performance characteristics determined by the reporting laboratory.  This laboratory is certified under the Clinical Laboratory Improvement Amendments CLIA as qualified to perform high complexity clinical laboratory testing.    MEROPENEM Value in next row Sensitive      <=4 SENSITIVEThis is a modified FDA-approved test that has been  validated and its performance characteristics determined by the reporting laboratory.  This laboratory is certified under the Clinical Laboratory Improvement Amendments CLIA as qualified to perform high complexity clinical laboratory testing.    * >=100,000 COLONIES/mL ESCHERICHIA COLI   Klebsiella pneumoniae - MIC*    AMPICILLIN Value in next row Resistant      <=4 SENSITIVEThis is a modified FDA-approved test that has been validated and its performance characteristics determined by the reporting laboratory.  This laboratory is certified under the Clinical Laboratory Improvement Amendments CLIA as qualified to perform high complexity clinical laboratory testing.    CEFAZOLIN (URINE) Value in next row Resistant      >=32 RESISTANTThis is a modified FDA-approved test that has been validated and its performance characteristics determined by the reporting laboratory.  This laboratory is certified under the Clinical Laboratory Improvement Amendments CLIA as qualified to perform high complexity clinical laboratory testing.    CEFEPIME  Value in next row Sensitive      >=32 RESISTANTThis is a modified FDA-approved test that has been validated and its performance characteristics determined by the reporting laboratory.  This laboratory is certified under the Clinical Laboratory Improvement Amendments CLIA as qualified to perform high complexity clinical laboratory testing.    ERTAPENEM Value in next row Sensitive      >=32 RESISTANTThis is a modified FDA-approved test that has been validated and its performance characteristics determined by the reporting laboratory.  This laboratory is certified under the Clinical Laboratory Improvement Amendments CLIA as qualified to perform high complexity clinical laboratory testing.    CEFTRIAXONE  Value in next row Resistant      >=32 RESISTANTThis is a modified FDA-approved test that has been validated and its performance characteristics determined by the reporting  laboratory.  This laboratory is certified under the Clinical Laboratory Improvement Amendments CLIA as qualified to perform high complexity clinical laboratory testing.    CIPROFLOXACIN Value in next row Resistant      >=32 RESISTANTThis is a modified FDA-approved test that has been validated and its performance characteristics determined by the reporting laboratory.  This laboratory is certified under the Clinical Laboratory Improvement Amendments CLIA as qualified to perform high complexity clinical laboratory testing.    GENTAMICIN Value in next row Sensitive      >=32 RESISTANTThis is a modified FDA-approved test that has been validated and its performance characteristics determined by the reporting laboratory.  This laboratory is certified under the Clinical Laboratory Improvement Amendments CLIA as qualified to perform high complexity clinical laboratory testing.    NITROFURANTOIN Value in next row Resistant      >=32 RESISTANTThis is a modified FDA-approved test that has been validated and its performance characteristics  determined by the reporting laboratory.  This laboratory is certified under the Clinical Laboratory Improvement Amendments CLIA as qualified to perform high complexity clinical laboratory testing.    TRIMETH/SULFA Value in next row Resistant      >=32 RESISTANTThis is a modified FDA-approved test that has been validated and its performance characteristics determined by the reporting laboratory.  This laboratory is certified under the Clinical Laboratory Improvement Amendments CLIA as qualified to perform high complexity clinical laboratory testing.    AMPICILLIN/SULBACTAM Value in next row Intermediate      >=32 RESISTANTThis is a modified FDA-approved test that has been validated and its performance characteristics determined by the reporting laboratory.  This laboratory is certified under the Clinical Laboratory Improvement Amendments CLIA as qualified to perform high complexity  clinical laboratory testing.    PIP/TAZO Value in next row Sensitive      <=4 SENSITIVEThis is a modified FDA-approved test that has been validated and its performance characteristics determined by the reporting laboratory.  This laboratory is certified under the Clinical Laboratory Improvement Amendments CLIA as qualified to perform high complexity clinical laboratory testing.    MEROPENEM Value in next row Sensitive      <=4 SENSITIVEThis is a modified FDA-approved test that has been validated and its performance characteristics determined by the reporting laboratory.  This laboratory is certified under the Clinical Laboratory Improvement Amendments CLIA as qualified to perform high complexity clinical laboratory testing.    * >=100,000 COLONIES/mL KLEBSIELLA PNEUMONIAE  MRSA Next Gen by PCR, Nasal     Status: None   Collection Time: 02/28/24 10:31 PM   Specimen: Nasal Mucosa; Nasal Swab  Result Value Ref Range Status   MRSA by PCR Next Gen NOT DETECTED NOT DETECTED Final    Comment: (NOTE) The GeneXpert MRSA Assay (FDA approved for NASAL specimens only), is one component of a comprehensive MRSA colonization surveillance program. It is not intended to diagnose MRSA infection nor to guide or monitor treatment for MRSA infections. Test performance is not FDA approved in patients less than 20 years old. Performed at [facility], [address] [place], [place] [address]     Labs: CBC: Recent Labs  Lab 02/28/24 1527 02/29/24 0148 03/02/24 1053 03/03/24 0220 03/04/24 0448 03/06/24 0356  WBC 9.0 11.2* 6.2 6.9 6.8 7.6  NEUTROABS 6.9  --   --  4.6  --   --   HGB 10.7* 9.8* 10.3* 10.6* 10.2* 10.5*  HCT 34.1* 30.9* 31.4* 32.1* 30.7* 32.9*  MCV 104.3* 104.4* 100.0 100.6* 100.0 102.8*  PLT 173 182 147* 155 172 195   Basic Metabolic Panel: Recent Labs  Lab 02/28/24 1807 02/28/24 2040 02/29/24 0148 03/01/24 0423 03/01/24 1948 03/02/24 0438 03/03/24 0220 03/04/24 0448  03/05/24 0727 03/06/24 0356  NA  --    < > 143 142  --  140 142 142 140 138  K  --    < > 4.3 3.8  --  4.1 4.5 4.2 4.1 4.4  CL  --    < > 112* 109  --  106 106 104 103 102  CO2  --    < > 22 24  --  27 29 28 31 27   GLUCOSE  --    < > 176* 86  --  88 98 92 92 102*  BUN  --    < > 22 12  --  11 12 19 20 21   CREATININE  --    < > 0.78 0.67  --  0.58* 0.65 0.70 0.74 0.82  CALCIUM  --    < > 9.0 9.0  --  9.3 9.7 9.7 9.8 10.2  MG 1.7  --  1.7 1.9  --   --  2.3  --   --   --   PHOS 2.6  --  2.5 1.6* 2.8 2.5 2.6  --   --   --    < > = values in this interval not displayed.   Liver Function Tests: Recent Labs  Lab 02/28/24 1527  AST 21  ALT 18  ALKPHOS 86  BILITOT 0.4  PROT 6.6  ALBUMIN 3.7   CBG: Recent Labs  Lab 02/28/24 1806 02/28/24 2225  GLUCAP 82 109*    Discharge time spent: greater than 30 minutes.  Signed: Alban Pepper, MD [facility] 03/06/2024

## 2024-03-06 NOTE — Progress Notes (Signed)
 Occupational Therapy Treatment Patient Details Name: Bryan Howard. MRN: [id number] DOB: 1932-06-06 Today's Date: 03/06/2024   History of present illness 89 year old male PMH of recurrent UTIs, AAA, CAD, diverticulosis, esophageal stricture, GERD, hyperlipidemia, hyperthyroidism and hypothyroidism who presented to the ED via EMS for complaints of increased confusion, low blood pressure and possible UTI. Has a history of frequent UTIs and his last urinalysis showed ESBL in urine. MD dx includes septic shock, lactic acidosis, hx of CAD, hx of HTN.   OT comments  Upon entering the room, pt seated in recliner chair and agreeable to OT intervention. Pt washing face and hands with set up A. Pt brushing teeth from seated position at recliner chair with set up A to obtain all needed items. Pt reports pain in buttocks. He stands for ~ 2 minutes with min A. Pillow to offload buttocks when seated. Pt returning to seated position in recliner chair. Chair alarm activated and call bell within reach.       If plan is discharge home, recommend the following:  A lot of help with bathing/dressing/bathroom;A lot of help with walking and/or transfers;Two people to help with walking and/or transfers   Equipment Recommendations  Other (comment) (defer to next venue of care)       Precautions / Restrictions Precautions Precautions: Fall Recall of Precautions/Restrictions: Impaired       Mobility Bed Mobility               General bed mobility comments: seated in recliner chair    Transfers Overall transfer level: Needs assistance Equipment used: Rolling walker (2 wheels) Transfers: Sit to/from Stand Sit to Stand: Min assist                 Balance Overall balance assessment: Needs assistance Sitting-balance support: Feet supported, Bilateral upper extremity supported Sitting balance-Leahy Scale: Fair     Standing balance support: Bilateral upper extremity supported, During  functional activity, Reliant on assistive device for balance Standing balance-Leahy Scale: Poor                             ADL either performed or assessed with clinical judgement   ADL Overall ADL's : Needs assistance/impaired     Grooming: Wash/dry face;Sitting;Set up;Wash/dry hands;Oral care                                      Extremity/Trunk Assessment Upper Extremity Assessment Upper Extremity Assessment: Generalized weakness   Lower Extremity Assessment Lower Extremity Assessment: Generalized weakness        Vision Patient Visual Report: No change from baseline           Communication Communication Communication: Impaired Factors Affecting Communication: Hearing impaired   Cognition Arousal: Alert Behavior During Therapy: WFL for tasks assessed/performed Cognition: No apparent impairments                               Following commands: Impaired Following commands impaired: Follows one step commands with increased time      Cueing   Cueing Techniques: Verbal cues, Tactile cues, Visual cues             Pertinent Vitals/ Pain       Pain Assessment Pain Assessment: Faces Faces Pain Scale: Hurts little more Pain Location: buttocks Pain  Descriptors / Indicators: Discomfort Pain Intervention(s): Limited activity within patient's tolerance, Monitored during session, Repositioned         Frequency  Min 2X/week        Progress Toward Goals  OT Goals(current goals can now be found in the care plan section)  Progress towards OT goals: Progressing toward goals      AM-PAC OT 6 Clicks Daily Activity     Outcome Measure   Help from another person eating meals?: A Little Help from another person taking care of personal grooming?: A Little Help from another person toileting, which includes using toliet, bedpan, or urinal?: A Lot Help from another person bathing (including washing, rinsing, drying)?: A  Lot Help from another person to put on and taking off regular upper body clothing?: A Lot Help from another person to put on and taking off regular lower body clothing?: A Lot 6 Click Score: 14    End of Session Equipment Utilized During Treatment: Rolling walker (2 wheels)  OT Visit Diagnosis: Other abnormalities of gait and mobility (R26.89);Muscle weakness (generalized) (M62.81);Unsteadiness on feet (R26.81)   Activity Tolerance Patient tolerated treatment well   Patient Left with call bell/phone within reach;in chair;with chair alarm set   Nurse Communication Mobility status        Time: 8879-8856 OT Time Calculation (min): 23 min  Charges: OT General Charges $OT Visit: 1 Visit OT Treatments $Self Care/Home Management : 23-37 mins  Izetta Claude, MS, OTR/L , CBIS ascom [phone]  03/06/24, 12:57 PM

## 2024-03-06 NOTE — Plan of Care (Signed)
  Problem: Education: Goal: Knowledge of General Education information will improve Description: Including pain rating scale, medication(s)/side effects and non-pharmacologic comfort measures Outcome: Progressing   Problem: Clinical Measurements: Goal: Cardiovascular complication will be avoided Outcome: Progressing   Problem: Nutrition: Goal: Adequate nutrition will be maintained Outcome: Progressing   Problem: Elimination: Goal: Will not experience complications related to bowel motility Outcome: Progressing Goal: Will not experience complications related to urinary retention Outcome: Progressing   Problem: Pain Managment: Goal: General experience of comfort will improve and/or be controlled Outcome: Progressing   Problem: Safety: Goal: Ability to remain free from injury will improve Outcome: Progressing

## 2024-03-31 NOTE — Consults (Signed)
 "   Infectious Diseases Consultation Note   ADMIT DATE: 03/30/2024 DATE OF CONSULTATION: 03/31/2024 SERVICE REQUESTING CONSULTATION: Internal Medicine REASON FOR CONSULTATION: complicated urinary tract infection CHIEF COMPLAINT: recurrent UTI, dysuria present   Subjective   HISTORY OF PRESENT ILLNESS:  Bryan Howard is a 89 y.o. male with hypertension, OSA (not on CPAP), HFpEF, moderate AS, prior PE (2024, now off anticoagulation), 1st degree AVB, TIA, AAA s/p endovascular repair (2019), hypothyroidism, prostate cancer s/p cryoablation, and recurrent UTIs admitted with dysuria and generalized weakness.   Main complain on admission was generalized weakness accompanied by a burning pain when he urinates. He thinks this is distinct from his general difficulty urinating (which he has since prostate cancer diagnosis). Notably, he was recently admitted for the same - last treated with gentamicin x1 for cystitis. He is chronically incontinent and usually in pullups at hoome.   With this admission, he has been afebrile, free of leukocytosis, and with negative blood cultures to date.   Of note, given extensive history of suspected polymicrobial UTIs and intermittent air on CT imaging, he was evaluated for a possible colovesicular fistula with an initial cystoscopy showing debris concerning for fistula (though not definitive) and  later repeat cystoscopy no longer showed signs of fistula. He eventually had a negative cystogram at [facility] in November.   Antibiotics:   Meropenem  11/27-11/28, 12/13-present   Cefepime  11/27-11/28   Gentamicin 12/2   Amoxicillin -clavulanate for multiple courses  REVIEW OF SYSTEMS: A complete 10-system review of systems is negative except as noted above and for the following: Negative except as noted above  PAST MEDICAL HISTORY: Past Medical History:  Diagnosis Date   Advance directive on file 12/14/2011   Advance directive on file 12/14/2011   Barrett's  esophagus    hx, with resolution on his repeat.   Cervical stenosis of spine    Choledocholithiasis 03/2016   Underwent ERCP with sphincterotomy   Chronic obstructive pulmonary disease, unspecified COPD type (CMS/HHS-HCC) 11/10/2022   Congestive heart failure, unspecified HF chronicity, unspecified heart failure type (CMS/HHS-HCC) 11/10/2022   Coronary atherosclerosis 10/13/2007   Depression    Foot drop, right 02/26/2015   GERD (gastroesophageal reflux disease) 08/02/2011   Gout, joint    Hepatitis    as child   History of cataract    Hyperlipidemia    Hypertension    Neuropathy    OSA (obstructive sleep apnea) 09/08/2011   no sleep study at this time   Osteoarthritis    Prostate cancer (CMS/HHS-HCC)    Refusal of blood transfusions as patient is Jehovah's Witness 12/14/2011   Sleep apnea    Stroke (CMS/HHS-HCC)    TIA approx. 10 years - no residual effects   Thyroid disease    TIA (transient ischemic attack)    04/20/2011   Transfusion of blood product refused for religious reason 12/14/2011   Past Surgical History:  Procedure Laterality Date   INGUINAL HERNIA REPAIR Right 1963   Lumbar laminectomy L4-L5  1973   REPAIR ABDOMINAL AORTIC ANEURYSM  2008   CRYOSURGERY PROSTATE N/A 12/28/2011   Procedure: CRYOSURGERY PROSTATE;  Surgeon: Debby Lynwood Mends, MD;  Location: [facility] OR;  Service: Urology;  Laterality: N/A;   CYSTOURETHROSCOPY N/A 12/28/2011   Procedure: CYSTOURETHROSCOPY;  Surgeon: Debby Lynwood Mends, MD;  Location: [facility] OR;  Service: Urology;  Laterality: N/A;   CYSTOURETHROSCOPY W/INSERTION URETHRAL STENT N/A 12/28/2011   Procedure: CYSTOURETHROSCOPY W/INSERTION URETHRAL STENT;  Surgeon: Debby Lynwood Mends, MD;  Location: [facility];  Service: Urology;  Laterality: N/A;   ESOPHAGOGASTRODOUDENOSCOPY W/BIOPSY N/A 02/20/2014   Procedure: EGD;  Surgeon: Charlie Franky Debarah Mickey., MD;  Location: [facility];   Service: Gastroenterology;  Laterality: N/A;   LAMINECTOMY POSTERIOR CERVICLE DECOMP W/FACETECTOMY & FORAMINOTOMY Bilateral 12/11/2014   Procedure: **AIRO #1** C6-T2 laminectomy, C3-T2 fusion/stabilization with AIRO guidance;  Surgeon: Cena Rankin Gaines, MD;  Location: [facility];  Service: Neurosurgery;  Laterality: Bilateral;   LAMINECTOMY POSTERIOR CERVICLE DECOMP W/FACETECTOMY & FORAMINOTOMY Bilateral 12/11/2014   Procedure: LAMINEC/FACETECT/FORAMIN,EACH ADDNL;  Surgeon: Cena Rankin Gaines, MD;  Location: [facility];  Service: Neurosurgery;  Laterality: Bilateral;   POSTERIOR CERVICLE SPINE FUSION MULTIPLE LEVELS Bilateral 12/11/2014   Procedure: POSTERIOR CERVICLE SPINE FUSION MULTIPLE LEVELS;  Surgeon: Cena Rankin Gaines, MD;  Location: [facility];  Service: Neurosurgery;  Laterality: Bilateral;   ARTHRODESIS POSTERIOR CERVICLE SPINE Bilateral 12/11/2014   Procedure: ARTHRODESIS POSTERIOR CERVICLE SPINE;  Surgeon: Cena Rankin Gaines, MD;  Location: [facility];  Service: Neurosurgery;  Laterality: Bilateral;   INSTRUMENTATION POSTERIOR SPINE 7 TO 12 VERTEBRAL SEGMENTS Bilateral 12/11/2014   Procedure: POSTERIOR SEGMENTAL INSTRUMENTATION (EG, PEDICLE FIXATION, DUAL RODS WITH MULTIPLE HOOKS AND SUBLAMINAR WIRES); 7 TO 12 VERTEBRAL SEGMENTS (LIST IN ADDITION TO PRIMARY PROCEDURE);  Surgeon: Cena Rankin Gaines, MD;  Location: [facility];  Service: Neurosurgery;  Laterality: Bilateral;   INSERTION MORSELIZED BONE ALLOGRAFT FOR SPINE SURGERY Bilateral 12/11/2014   Procedure: INSERTION MORSELIZED BONE ALLOGRAFT FOR SPINE SURGERY;  Surgeon: Cena Rankin Gaines, MD;  Location: [facility];  Service: Neurosurgery;  Laterality: Bilateral;   AUTOGRAFT OBTAINED SAME INCISION FOR SPINE SURGERY Bilateral 12/11/2014   Procedure: AUTOGRAFT OBTAINED SAME INCISION FOR SPINE SURGERY;  Surgeon: Cena Rankin Gaines, MD;  Location: [facility];  Service: Neurosurgery;  Laterality: Bilateral;   LAMINECTOMY POSTERIOR LUMBAR FACETECTOMY & FORAMINOTOMY W/DECOMP Bilateral 06/11/2015   Procedure: Lumbar three-five laminectomy with Cell Saver;  Surgeon: Cena Rankin Gaines, MD;  Location: [facility];  Service: Neurosurgery;  Laterality: Bilateral;   LAMINECTOMY POSTERIOR CERVICLE DECOMP W/FACETECTOMY & FORAMINOTOMY Bilateral 06/11/2015   Procedure: LAMINECTOMY, FACETECTOMY AND FORAMINOTOMY, SINGLE VERTEBRAL SEGMENT; EACH ADDITIONAL SEGMENT, CERVICAL, THORACIC, OR LUMBAR (LIST IN ADDITION TO PRIMARY PROCEDURE);  Surgeon: Cena Rankin Gaines, MD;  Location: [facility];  Service: Neurosurgery;  Laterality: Bilateral;   ENDOSCOPIC RETROGRADE CHOLANGIO-PANCREATOGRAPHY W/SPHINCTEROTOMY N/A 04/07/2016   Procedure: ENDOSCOPIC RETROGRADE CHOLANGIOPANCREATOGRAPHY (ERCP); WITH SPHINCTEROTOMY/PAPILLOTOMY;  Surgeon: Deward Ray Napoleon, MD;  Location: [facility];  Service: Gastroenterology;  Laterality: N/A;   ENDOSCOPIC RETROGRADE CHOLANGIO-PANCREATOGRAPHY W/EXTRACTION STONE N/A 04/07/2016   Procedure: ENDOSCOPIC RETROGRADE CHOLANGIOPANCREATOGRAPHY (ERCP); WITH REMOVAL OF CALCULI/DEBRIS FROM BILIARY/PANCREATIC DUCT(S);  Surgeon: Paul Simon Jowell, MD;  Location: [facility];  Service: Gastroenterology;  Laterality: N/A;   ENDOSCOPY OF BILIARY DUCT  04/07/2016   Procedure: ENDOSCOPIC CATHETERIZATION OF THE BILIARY DUCTAL SYSTEM, RADIOLOGICAL SUPERVISION AND INTERPRETATION;  Surgeon: Deward Ray Napoleon, MD;  Location: [facility];  Service: Gastroenterology;;   CORI W/BIOPSY  11/24/2017   Procedure: ESOPHAGOGASTRODUODENOSCOPY, FLEXIBLE, TRANSORAL; WITH BIOPSY, SINGLE OR MULTIPLE;  Surgeon: Louann [name] Hora, MD;  Location: [facility];  Service: Gastroenterology;;   LAMINECTOMY POSTERIOR LUMBAR 1/2 SEGMENTS Right 08/02/2019   Procedure: Right L2-L3 Minimally  Invasive Laminectomy;  Surgeon: Than, Rodrick Boer, MD;  Location: [facility] OR;  Service: Neurosurgery;  Laterality: Right;   ORCHIECTOMY Right 01/30/2022   Procedure: ORCHIECTOMY, SIMPLE (INCLUDING SUBCAPSULAR), WITH OR WITHOUT TESTICULAR, PROSTHESIS, SCROTAL OR INGUINAL APPROACH;  Surgeon: Maryanne Reyes Rana,  MD;  Location: [facility];  Service: Urology;  Laterality: Right;   ESOPHAGOGASTRODOUDENOSCOPY W/BIOPSY N/A 07/07/2022   Procedure: ESOPHAGOGASTRODUODENOSCOPY, FLEXIBLE, TRANSORAL; WITH BIOPSY, SINGLE OR MULTIPLE;  Surgeon: Dorris Nat Sours, MD;  Location: [facility];  Service: Gastroenterology;  Laterality: N/A;   COLONOSCOPY W/REMOVAL LESIONS BY SNARE N/A 07/07/2022   Procedure: COLONOSCOPY, FLEXIBLE; WITH REMOVAL OF TUMOR(S), POLYP(S), OR OTHER LESION(S) BY SNARE TECHNIQUE;  Surgeon: Dorris Nat Sours, MD;  Location: [facility];  Service: Gastroenterology;  Laterality: N/A;   CYSTOURETHROSCOPY N/A 01/02/2024   Procedure: CYSTOURETHROSCOPY (SEPARATE PROCEDURE);  Surgeon: Scales, Carlin Vicenta Raddle., MD;  Location: [facility] OR;  Service: Urology;  Laterality: N/A;   APPENDECTOMY     Bilateral cataract surgery     CATARACT EXTRACTION     JOINT REPLACEMENT Bilateral 2003, 2004   bilateral knee replacement    PROSTATE SURGERY  cryo ablation   REPAIR ROTATOR CUFF TEAR ACUTE OPEN Left    ALLERGIES: Allergies  Allergen Reactions   Captopril Cough   Quinolones Other (See Comments)    AAA patient- avoid if possible   CURRENT MEDICATIONS: Medication List & Administration Record reviewed on rounds  Scheduled Meds:   acetaminophen   650 mg Oral Daily   ascorbic acid (vitamin C)  500 mg Oral BID   atorvastatin   80 mg Oral QHS   celecoxib  200 mg Oral BID   cholecalciferol   1,000 Units Oral Daily   cyanocobalamin   1,000 mcg Oral Daily   DULoxetine   60 mg Oral Daily   gabapentin   300 mg Oral QHS   heparin  (porcine)  5,000 Units Subcutaneous Q8H SCH    HYDROcodone -acetaminophen   1 tablet Oral Daily   levothyroxine   100 mcg Oral Daily before breakfast (0630)   pantoprazole  20 mg Oral BID AC   polyethylene glycol  17 g Oral Daily   Infusions:  PRNs: acetaminophen , carboxymethylcellulose, lidocaine , melatonin, menthol, sennosides-docusate  PROBLEM LIST: Principal Problem:   UTI (urinary tract infection) Resolved Problems:   * No resolved hospital problems. *  Immunizations: Immunization History  Administered Date(s) Administered   [employer] Monovalent Vaccine 06/26/2019, 07/16/2019, 04/01/2020   COVID-19 vaccine >12 years (Pfizer[employer], Bivalent) IM Injection 30 mcg/0.3mL 02/09/2021   PNEUMOCOCCAL (PCV13) (BIRTH-48YR) VACCINE (PREVNAR 13) 03/11/2014, 03/11/2014   PNEUMOCOCCAL (PPSV23)(>=98YRS -OR- >=2 YRS WITH RISK) VACCINE (PNEUMOVAX 23) 04/19/2008   TDAP (>=31YR) VACCINE (ADACEL/BOOSTRIX) 09/15/2010    SOCIAL HISTORY: Place of residence and approximate distance from [facility]: [address] [place] [address] Tobacco:  reports that he quit smoking about 47 years ago. His smoking use included cigarettes. He started smoking about 65 years ago. He has a 55.5 pack-year smoking history. He has never used smokeless tobacco. Alcohol:  reports that he does not currently use alcohol. Illicit drug use: none Sexual history:  reports never being sexually active. Travel (location, approximate date and time spent): none recently   FAMILY HISTORY: family history includes Aneurysm in his sister; Arthritis in an other family member; COPD in his sister; Coronary Artery Disease (Blocked arteries around heart) in his father and mother; Heart disease in his father and sister; Lung cancer in his sister and sister; Nephrolithiasis in an other family member; Prostate cancer in his cousin.  Objective   PHYSICAL EXAMINATION: Vitals:   03/31/24 0739  BP:   Pulse: 54  Resp:   Temp:    Temp (24hrs), Avg:36.4 C (97.6  F), Min:36.1 C (97 F), Max:36.7 C (98.1 F)  Ht Readings from  Last 1 Encounters:  03/30/24 167.6 cm (5' 6)   Wt Readings from Last 1 Encounters:  03/30/24 74.5 kg (164 lb 3.9 oz)   I/O last 3 completed shifts: In: 1100 [IV Piggyback:1100] Out: -   General: alert, cooperative, chronically ill appearing  Psych: oriented to time, place and person, mood and affect are approprate Eyes: PERRL, EOMI, conjunctiva clear, anicteric sclera Neck: supple and no adenopathy HENT: oropharynx clear, moist mucous membranes Cardiovascular: regular rate and rhythm, without murmurs, rubs or gallops Respiratory: clear to auscultation, good air exchange Abdomen: soft, nontender, nondistended, normoactive bowel sounds , no CVA tenderness, no significant bladder tenderness GU: no bogginess or tenderness on prostate exam  Extremities: no lower extremity edema Skin: no rashes or lesions  MICROBIOLOGY:   05/18/23: Urine cx:   1) >100,000 Aerococcus urinae   2) 1,000-10,000 mixed flora    07/10/23: Urine cx:   1) >100,000 Aerococcus urinae   2) 1,000-10,000 mixed flora    07/31/23: Urine cx: >100,000 Enterococcus faecium (AmpR, DaptoS, LinezS, NitroI, TetI, VancS)    09/03/23: Urine cx:   1) >100,000 Aerococcus urinae   2) 50,000-100,000 Enterococcus faecium    09/25/23: Blood cx: No growth x2   09/25/23: Urine cx:   1) Klebsiella pneumoniae >100,000 (AmiS, AmoxClavS, AmpR, CefS, CefuroxS, CiproS, NitroS, PiptazoS, TetS, TobraS, BactrimS)   2) Aerococcus urinae >100,000   10/18/23: Urine cx:   1) Enterococcus faecium (AmpR, VancS)   2) Mixed flora    12/09/23: Urine cx:   1) E coli (AmiS, AmpR, CefS, CefuroxS, CiproS, GentS, NitroS, PiptazoS, TmpSmxS)     2) Mixed flora    01/02/24: Urine cx: Mixed flora    03/15/24: Blood cx: No growth x2   03/15/24: Urine cx:   1) E coli (AmiS, AmpR, CefS/I, CefuroxS, CiproS, GentS, LevoS, NitroS, PiptazoS, TobraS)   2) Klebsiella pneumoniae (AmiS, AmoxClavS, AmpSulI,  CefR, CefeR, CtxR, CiproR, ErtaS, MeroS, NitroI, TobraS, TmpSmxR)    03/30/24: Blood cx: No growth x2   03/31/24: Urine cx: Pending   LABORATORY STUDIES:  Labs in chart were reviewed. Lab Results  Component Value Date   WBC 6.2 03/31/2024   HGB 10.3 (L) 03/31/2024   HCT 31.6 (L) 03/31/2024   PLT 151 03/31/2024   Lab Results  Component Value Date   NA 140 03/31/2024   K 4.4 03/31/2024   CL 103 03/31/2024   CO2 29 03/31/2024   BUN 20 03/31/2024   CREATININE 0.9 03/31/2024   GLUCOSE 98 03/31/2024   Lab Results  Component Value Date   CALCIUM  9.8 03/31/2024   MG 1.8 03/31/2024   PHOS 2.9 12/09/2023   Lab Results  Component Value Date   AST 22 03/30/2024   ALT 23 03/30/2024   ALKPHOS 81 03/30/2024   TBILI 0.7 03/30/2024   CONJBILI 0.3 12/11/2017   ALB 3.6 03/30/2024   TOTALPROTEIN 7.4 03/30/2024   Lab Results  Component Value Date   APTT 28.9 03/15/2024   INR 0.9 03/15/2024   Lab Results  Component Value Date   COLORU Yellow 03/31/2024   CLARITYU Cloudy (!) 03/31/2024   SPECGRAV 1.020 03/31/2024   PHUR 7.0 10/24/2009   GLUCOSEU Negative 03/31/2024   KETONESU 1+ (!) 03/31/2024   BLOODU Negative 03/31/2024   NITRITE Positive (!) 03/31/2024   LEUKOCYTESUR 3+ (!) 03/31/2024   BILIRUBINUR Negative 03/31/2024   UROBILINOGEN 0.2 03/31/2024   RBCUA 0 03/31/2024   WBCUA >182 (H) 03/31/2024   SQUAMEPI 1 03/31/2024   CASTUA  0 01/08/2020   BACTERIA >50 (!) 10/01/2019   IMAGING:    03/05/24: CT cystogram: IMPRESSION:  1. No evidence of bladder leak or fistula.  2. Bladder wall thickening with mild trabeculation, likely due to  chronic bladder outlet obstruction.  3. Nonobstructing bilateral renal calculi.  4. Cholelithiasis without evidence of cholecystitis or biliary  dilatation.  5. Distal colonic diverticulosis without evidence of acute  inflammation.  6. Aortic Atherosclerosis (ICD10-I70.0).     03/15/24: CT: Impression: 1.  No acute intra-abdominal  abnormality. 2.  Similar small volume of air within the bladder, fistula not excluded.  3.  Diverticulosis without evidence of diverticulitis.    03/20/24: XR gastrograffin: Impression:  No definite colovesical fistula to the level of the proximal descending colon was identified on this study. If there is ongoing clinical concern, consider further evaluation with CT cystogram once enteric contrast has cleared from the lower gastrointestinal system.    03/30/24: CT brain: IMPRESSION:  1.  No acute intracranial abnormalities.  2.  Unchanged ventriculomegaly with enlargement of the sylvian fissures and mildly narrowed callosal angle. These findings are nonspecific but can be seen in the setting of normal pressure hydrocephalus in the correct clinical setting.  Assessment & Plan   ASSESSMENT: 89 y.o. male with hypertension, OSA (not on CPAP), HFpEF, moderate AS, prior PE (2024, now off anticoagulation), 1st degree AVB, TIA, AAA s/p endovascular repair (2019), hypothyroidism, prostate cancer s/p cryoablation, and recurrent UTIs admitted with dysuria and generalized weakness.   He is systemically afebrile and not at all unstable, so empiric treatment technically not necessary while awaiting culture. He's already been started on meropenem , and since he does have symptomatic correlate (dysuria), I suppose OK to continue for the moment simply because its already been given for today - but there is no fever, CVA tenderness or suggestion of any upper or complicated infection - so short course will be strongly preferred especially in setting of his repeated issues (longer or repeated exposures to antibiotics will only increase risk for resistance - Cai et al CID 2015). H  We can tailor this course based on the isolate, though more broadly, there is no role for antibiotic suppression when the natural history of repeated UTI in setting of chronic prostate cancer, incomplete voiding, and urinary  incontinence pretty much assures the urine will never be free of bacteria.   With his extensive polymicrobial growth history, prior concern for colo-vesicular fistula is noted - however I see that he's undergone cystoscopy at least twice, a CT cystogram once, and a gastrograffin study once, each without confirming fistula. I did also wonder about prostatitis, though he has no bogginess or tenderness on exam and these are polymicrobial infections with distinct organisms - not suggestive of persistence within a single nidus (as one might see with perhaps a prostatic abscess for example).   Since chronic antibiotics are unlikely to have any benefit but highly likely to simply result in eventually untreatable resistance, probably the only option once might consider - if no one can address his outlet obstruction and incontinence issues - might be methenamine  after completion of this course Davina dunker al BMJ 2022) as at least this will not trigger ever increasing loss of treatment options.   RECOMMENDATIONS:  1) Cystitis:    - OK to continue meropenem  pending culture result   - Anticipate short course appropriate for cystitis   - Would recommend against oral antibiotic suppression   - Could offer methenamine  hippurate 1 g PO  Q12h for prevention trial   ID will follow until cultures result - formal team assignment to occur Monday if results are not back before then   Mabel Bihari MD MHSc FACP Assistant Professor - Division of Infectious Diseases Pager: [phone]  Attestation Statement:   I personally performed the service. (TP)  NICHOLAS PRENTICE BIHARI, MD   "

## 2024-04-01 NOTE — Discharge Summary (Signed)
 "  [facility] Medicine Discharge Summary   Admit Date: 03/30/2024 Discharge Date: 04/03/2024  Admitting Physician: No admitting provider for patient encounter. Discharge Physician: Bryan Slater Macintosh, MD  Primary Care Provider: Jyl Heron Haff, MD, Phone [phone]  Discharge Destination: Skilled nursing facility: [facility]  Admission Diagnoses:  Bilateral hip pain [M25.551, M25.552] Acute pain of right shoulder [M25.511] Urinary tract infection without hematuria, site unspecified [N39.0] Altered mental status, unspecified altered mental status type [R41.82] UTI (urinary tract infection) [N39.0]  Discharge Diagnoses:  Principal Problem:   UTI (urinary tract infection) Active Problems:   OSA (obstructive sleep apnea)   Ureterolithiasis, left   Hx of transient ischemic attack (TIA) 12/12   Hx of abdominal aortic aneurysm   Transfusion of blood product refused for religious reason   Anemia of chronic disease   Prostate cancer (CMS/HHS-HCC) Resolved Problems:   * No resolved hospital problems. *  Primary Diagnosis: Admitted for acute cystitis   Changes Made (with rationale):  Started methenamine  for recurrent UTI  To-Do List (incidental findings, follow-up studies, etc.): Follow-up with urology for any further evaluation of anatomical etiology of recurrent UTI  Consider iron  infusions as outpt   Anticipatory Guidance for Outpatient Care:  Has previously undergone extensive workup for structural causes of recurrent UTI without identifying etiology. Started on methenamine  per ID. No need for ID followup per ID.    Results Pending at Discharge:  None Please see phone numbers at end of this summary for lab contact information.   Follow-up/Care Transition Plan: Sched. appts: Future Appointments  Date Time Provider Department Center  04/05/2024 12:30 PM Bryan Howard, [place] [facility]  04/06/2024 12:00 PM Bryan Browning, LCSW [facility]  04/11/2024 10:00 AM Bryan Heron Haff, MD [facility]  05/03/2024 10:00 AM Bryan Howard, Bryan Vicenta Raddle., MD [facility]  06/07/2024 10:30 AM LAB [facility] [place]  07/24/2024  2:00 PM Bryan Howard, [place] [facility]  08/17/2024 11:15 AM Bryan Howard, Bryan Jansky, MD DERM [address]  12/11/2024 12:00 PM Bryan Howard, Bryan Lacks, PA [facility]    Follow-up info: Bryan Howard [address] [place]  [address] [phone]        Allergies/Intolerances:  Allergies  Allergen Reactions   Captopril Cough   Quinolones Other (See Comments)    AAA patient- avoid if possible     New Adverse Drug Events: none  Medications:     Current Discharge Medication List     START taking these medications      Instructions  methenamine  mandelate 1 GM tablet Refills: 0  Commonly known as: MANDELAMINE Take 1 tablet (1 g total) by mouth 4 (four) times daily       These medications have been CHANGED      Instructions  atorvastatin  80 MG tablet Quantity: 100 tablet Refills: 3 What changed: when to take this  Commonly known as: LIPITOR Take 1 tablet (80 mg total) by mouth once daily Last time this was given: 80 mg on April 02, 2024  8:38 PM       CONTINUE taking these medications      Instructions  acetaminophen  650 MG ER tablet Refills: 0  Commonly known as: TYLENOL  Take one tablet every morning and two tablets nightly Last time this was given: Ask your nurse or doctor   alpha lipoic acid 200 mg Cap Refills: 0  Take 600 mg by mouth at  bedtime   ascorbic acid (vitamin C) 500 MG tablet Refills: 0  Commonly known as: VITAMIN C Take 500 mg by mouth 2 (two) times daily Last time this was given: 500 mg on April 03, 2024  8:31 AM   celecoxib 200 MG capsule Quantity: 60 capsule Refills: 5  Commonly known as: CeleBREX Take 1 capsule (200 mg total)  by mouth 2 (two) times daily Last time this was given: 200 mg on April 03, 2024  8:30 AM   cholecalciferol  25 mcg (1,000 unit) tablet Refills: 0 Generic drug: cholecalciferol   Take 1,000 Units by mouth once daily Last time this was given: 1,000 Units on April 03, 2024  8:31 AM   coenzyme Q10-vitamin E 100-5 mg-unit Cap Refills: 0  Take 200 mg by mouth   cyanocobalamin  1000 MCG tablet Quantity: 90 tablet Refills: 3  Commonly known as: VITAMIN B12 Take 1 tablet (1,000 mcg total) by mouth once daily Last time this was given: 1,000 mcg on April 03, 2024  8:31 AM   DULoxetine  60 MG DR capsule Quantity: 90 capsule Refills: 3  Commonly known as: CYMBALTA  Take 1 capsule (60 mg total) by mouth once daily Last time this was given: 60 mg on April 03, 2024  8:31 AM   folic acid  800 MCG tablet Refills: 0  Commonly known as: FOLVITE  Take 1 tablet by mouth at bedtime   gabapentin  300 MG capsule Quantity: 100 capsule Refills: 2  Commonly known as: NEURONTIN  Take 1 capsule (300 mg total) by mouth at bedtime Doctor's comments: Please send a replace/new response with 90-Day Supply if appropriate to maximize member benefit. Requesting 1 year supply. Last time this was given: 300 mg on April 02, 2024  8:38 PM   HYDROcodone -acetaminophen  7.5-325 mg tablet Refills: 0  Commonly known as: NORCO Take 1 tablet by mouth once daily Last time this was given: Ask your nurse or doctor   levothyroxine  100 MCG tablet Quantity: 90 tablet Refills: 3  Commonly known as: SYNTHROID  Take 1 tablet (100 mcg total) by mouth once daily Take on an empty stomach with a glass of water at least 30-60 minutes before breakfast. Last time this was given: 100 mcg on April 03, 2024  6:26 AM   naloxone 4 mg/actuation nasal spray Quantity: 1 each Refills: 0  Commonly known as: NARCAN Place 1 spray (4 mg total) into one nostril once as needed for up to 1 dose   omeprazole 20 MG DR  capsule Refills: 0  Commonly known as: PriLOSEC Take 1 capsule (20 mg total) by mouth 2 (two) times daily before meals Doctor's comments: Please send a replace/new response with 90-Day Supply if appropriate to maximize member benefit. Requesting 1 year supply.   phenazopyridine 100 MG tablet Refills: 0  Commonly known as: PYRIDIUM Take 100 mg by mouth 3 (three) times daily as needed   polyethylene glycol packet Refills: 0  Commonly known as: MIRALAX  Take 1 packet (17 g total) by mouth 2 (two) times daily Mix in 4-8ounces of fluid prior to taking. Last time this was given: 17 g on April 03, 2024  8:30 AM         Brief History of Present Illness:  Bryan Howard is a 89 y.o. male w/ hx of obesity (BMI 30), HTN, HL, OSA (not on CPAP), HFpEF, moderate AS, prior PE (2024, no longer on [facility]), 1st-degree heart block, non-obstructive CAD, TIA, AAA s/p endovascular repair (2019), hypothyroidism, prior diverticulitis, chronic pain (on maintenance  opiate therapy), prostate cancer s/p cryoablation, and recurrent UTIs with multiple recent admission for recurrent UTI who presents with generalized weakness.   He presents today because he's been extremely weak. At baseline, he is able to stand up by himself and transition without any issues. Since being discharged, he has only been able to transfer by himself once, which was Saturday morning. He was mobile and independent with changing, cooking. Starting later on Saturday, he became much weaker and no longer able to stand. He also has been very sleepy. His caregiver has also noticed a change in his urine odor.    His son also fell on him when moving him in bed and heard a pop on his right shoulder.    No SOB, chest pain, abdominal pain, fevers.   He lives by himself with caregivers. His son [name] lives in [place].    Per recent hospitalization at [facility] 11/27[facility]12[facility]: Pt with recurrent UTIs including recent admission for septic shock in the  setting of ESBL Klebsiella UTI, now with weakness and dysuria with pyuria on U/A. Given lack of alternative explanation for generalized weakness and fatigue of relatively acute onset, will treat for recurrent UTI, initially started on merropenem then narrowed to cefepime  per ID recs. Urine culture grew E coli that was sens, with 1-10k of ESBL Klebsiella. Genatmicin 5mg /kg x1 dose for acute cystitis was used to complete treatment for both once isolated. No further abx needed on discharge.   On presentation to the ED, vitals were T 36.1 C (97 F), HR 59, BP (!) 144/71, RR 18, and satting 95 % on RA.    ED work-up notable for: - Chem 7 w/ Cr 1.0, bicarb 27 - CBC notable for WBC 6.8, Hgb 11.4*, plts 170.  - Lactate 0.9 - UA w/ 3+ leukocytes, >182 WBC, positive nitrites. - Blood cultures drawn x2 - CXR: Focal opacity in the lower peripheral right lung. This may reflect atelectasis, aspiration, or infectious/inflammatory process.   ED interventions: - IV meropenem  given recent ESBL Klebsiella in urine _____________________   Hospital Course by Problem:  H/o recurrent UTIs/recent ESBL Klebsiella pneumoniae UTI  Pyuria  Pt has history of recurrent UTIs including recent admission for septic shock in the setting of ESBL Klebsiella UTI s/p gentamicin x 1 dose for acute cystitis. He presented with weakness, dysuria, and pyuria on UA without CVA tenderness. Given lack of alternative explanation for generalized weakness and fatigue of relatively acute onset, treated for recurrent UTI with meropenem  x 4 days followed by one dose of tobramycin. Urine culture was lost in transit and no organism was identified. Blood cultures without growth. We considered whether there is an anatomical component to patient's recurrent UTI, but prior workup has demonstrated no fistula based on review of prior workup; and this admission there was no evidence of prostatits based on ID's DRE exam. Query whether there is a stone  present and thus we don't have source control. ID recommended a short course with meropenem  and new initiation of methenamine  to to prevent recurrent. No need for ID followup.  Generalized Weakness Falls Xrays negative for fracture. Weakness improved with treatment of UTI per above. He was evaluated by PT/OT and recommended for SNF.  Chronic Back Pain Continued home acetaminophen , hydrocodone , duloxetine , gabapentin , and celecoxib.   HLD Continued home atorvastatin    Hypothyroidism Continued home levothyroxine    GERD Continued PPI   Jehovah's Witness Status Pt is a Jehovah's Witness, decline all blood products. Blood conservation order placed. Continued  home B12 and folate supplements.     Social Drivers of Health with Concerns   Tobacco Use: Medium Risk (03/30/2024)   Patient History    Smoking Tobacco Use: Former    Smokeless Tobacco Use: Never  Physical Activity: Insufficiently Active (01/04/2024)   Exercise Vital Sign    Days of Exercise per Week: 2 days    Minutes of Exercise per Session: 20 min  Social Connections: Moderately Isolated (02/29/2024)   Received from [facility]   Social Connection and Isolation Panel    In a typical week, how many times do you talk on the phone with family, friends, or neighbors?: Twice a week    How often do you get together with friends or relatives?: Twice a week    How often do you attend church or religious services?: Never    Do you belong to any clubs or organizations such as church groups, unions, fraternal or athletic groups, or school groups?: Yes    How often do you attend meetings of the clubs or organizations you belong to?: Never    Are you married, widowed, divorced, separated, never married, or living with a partner?: Widowed  Depression: Moderate depression (01/04/2024)   PHQ-9    PHQ-9 Score: 12  Health Literacy: Inadequate Health Literacy (01/04/2024)   B1300 Health Literacy    Frequency of need for help  with medical instructions: Sometimes    Surgeries and Procedures Performed:  None  _____________________  Discharge Exam:  BP 134/67 (BP Location: Right upper arm, Patient Position: Lying)   Pulse 89   Temp 36.9 C (98.5 F) (Oral)   Resp 18   Ht 167.6 cm (5' 5.98)   Wt 73.3 kg (161 lb 11.2 oz)   SpO2 91%   BMI 26.11 kg/m  O2 Device: Nasal cannula (03/31/24 1037) GEN:  Readily arousable. Communicative.  Non-toxic appearing. No acute distress.  CHEST:  Normal effort.  Clear to auscultation, without rales, rhonchi, wheezing or diminished breath sounds. CARDIAC:  Rhythm is regular.  Normal S1 and S2. No gallop or murmurs.  ABDOMEN:  Positive bowel sounds.  Soft, nondistended, nontender.  No guarding or rebound.   EXTREMITIES:  No c/c/e. 2+ distal pulses  Pertinent Lab Testing: Recent Labs  Lab 04/01/24 0624 04/02/24 0537 04/03/24 0445  NA 139 139 139  K 3.8 4.1 4.5  CL 105 103 103  CO2 27 27 30   BUN 25* 21* 18  CREATININE 0.9 0.9 0.8  GLUCOSE 100 102 94  CALCIUM  9.6 9.2 9.0   Recent Labs  Lab 03/30/24 2049  AST 22  ALT 23  ALKPHOS 81  TBILI 0.7    Recent Labs  Lab 04/01/24 0624 04/02/24 0537 04/03/24 0445  WBC 6.0 5.6 4.7  HGB 10.5* 10.4* 10.6*  HCT 31.6* 31.6* 32.9*  PLT 142* 118* 114*   No results for input(s): APTT, INR in the last 168 hours.   Other Pertinent Labs:  None  Micro:  Lab Results  Component Value Date   BLDCULT No growth at 3 days 03/30/2024   BLDCULT No growth at 3 days 03/30/2024    No results found for this visit on 03/30/24.    Pertinent Imaging:   X-ray shoulder complete right minimum 2 views Result Date: 03/31/2024 XR SHOULDER COMPLETE RIGHT MINIMUM 2 VIEWS Number of views: 4. Indication:  GENERALIZED WEAKNESS, SHOULDER PAIN, M25.511 Pain in right shoulder. Comparison: 11/19/2022 and 09/19/2018. Findings: Redemonstrated findings of advanced rotator cuff arthropathy of the glenohumeral joint  with severe joint space loss,  superior subluxation, and pseudoarticulation with the acromion process. No acute fracture or dislocation. Moderate degenerative changes of the AC joint. Unremarkable soft tissues. Visualized right hemithorax is unremarkable. Impression: 1.  Redemonstrated findings of advanced arthropathy of the glenohumeral joint and chronic rotator cuff pathology, similar to November 19, 2022. 2.  Moderate degenerative changes of the [facility] joint. The preliminary report (critical or emergent communication) was reviewed prior to this dictation and there are no substantial differences between the preliminary results and the impressions in this final report. Electronically Reviewed by:  Burnard maple Mon, MD, [facility] Electronically Reviewed on:  03/31/2024 3:19 AM I have reviewed the images and concur with the above findings. Electronically Signed by:  Dorise Flatten, MD, [facility] Electronically Signed on:  03/31/2024 8:05 AM  X-ray pelvis 1 to 2 views Result Date: 03/31/2024 Pelvis radiograph, AP view Clinical information: GENERALIZED WEAKNESS, SHOULDER PAIN, M25.551 Pain in right hip, M25.552 Pain in left hip. Comparison: Radiographs dated July 22, 2023 and September 19, 2018. Findings: Single view of the pelvis demonstrates anatomic alignment of the bilateral femoral heads and acetabula. No sacroiliac or symphysis diastases. The sacrum and lower lumbar spine are partially obscured by bowel gas but appear intact. Lower lumbar spondylosis. Generalized osseous demineralization. No acute fracture or dislocation on single AP view. Mild degenerative changes of the bilateral hips. Status post aortobiiliac stenting. Surgical clips projecting over the bilateral hips. Impression: No acute fracture or dislocation on single AP view of the pelvis. If there is clinical concern for a radiographically occult hip fracture, recommend MRI of the pelvis. The preliminary report (critical or emergent communication) was reviewed prior to this dictation and  there are no substantial differences between the preliminary results and the impressions in this final report. Electronically Reviewed by:  Burnard maple Mon, MD, [facility] Electronically Reviewed on:  03/31/2024 3:22 AM I have reviewed the images and concur with the above findings. Electronically Signed by:  Dorise Flatten, MD, [facility] Electronically Signed on:  03/31/2024 7:51 AM  X-ray chest single view portable Result Date: 03/30/2024 XR CHEST SINGLE VIEW PORTABLE INDICATION: Lung Aeration, R41.82 Altered mental status, unspecified COMPARISON: Chest radiograph March 15, 2024 FINDINGS/IMPRESSION: Redemonstrated enlarged cardiomediastinal silhouette with tortuous aorta, stable from prior. Aortic calcifications. Focal opacity in the lower peripheral right lung. This may reflect atelectasis, aspiration, or infectious/inflammatory process. Basilar predominant linear opacities, likely atelectasis. No large pleural effusion. No pneumothorax. Partially visualized cervical hardware. Electronically Reviewed by:  Wanna Hoit, MD, [facility] Electronically Reviewed on:  03/30/2024 9:02 PM I have reviewed the images and concur with the above findings. Electronically Signed by:  Nelwyn Hummer, MD, [facility] Electronically Signed on:  03/30/2024 9:04 PM  CT brain without contrast Result Date: 03/30/2024 CT BRAIN WITHOUT CONTRAST INDICATION: ams, R41.82 Altered mental status, unspecified COMPARISON: CT brain 12/10/2023 TECHNIQUE: Standard noncontrast brain CT. FINDINGS: Brain Parenchyma: There is no hemorrhage, cerebral edema, acute cortical infarction, mass, mass effect, or midline shift. Moderate periventricular and deep Bryan matter hypoattenuation compatible sequelae of microvascular angiopathy. Ventricles and Sulci: Unchanged prominence of the lateral and third ventricles with enlargement of the sylvian fissures and mild crowding of the high convexity sulci relative to the sylvian fissures.  Mildly narrowed callosal angle. Extra-Axial Spaces: No extra-axial fluid collection. Basal Cisterns: Normal. Paranasal Sinuses: Normal. Mastoids: Normal. Orbits: Normal. Cranium and Bones: Normal. Soft Tissues: Normal. IMPRESSION: 1.  No acute intracranial abnormalities. 2.  Unchanged ventriculomegaly with enlargement of the sylvian  fissures and mildly narrowed callosal angle. These findings are nonspecific but can be seen in the setting of normal pressure hydrocephalus in the correct clinical setting. Electronically Reviewed by:  Emeline Rosella, MD, [facility] Electronically Reviewed on:  03/30/2024 5:45 PM I have reviewed the images and concur with the above findings. Electronically Signed by:  Lynwood Spearman, MD, [facility] Electronically Signed on:  03/30/2024 5:49 PM  _____________________  Code Status: DNAR Goals of care were not addressed during this admission.   Status on Discharge:  Current activity: Chairfast (04/03/24 0900) Current mobility: Slightly limited (04/03/24 0900)  Activity Recommendation: activity as tolerated  Other Discharge Instructions: Services setup at discharge: SNF PT/OT Tubes/lines at discharge: None  Diet: Diet dysphagia IDDSI L7E easy to chew L0 Thin Liquids  Wound Care Order Instructions             Wound Care  As directed       Comments: Implement and continue Pressure Injury Plan of Care per Pressure Injury Prevention Policy and/or Unit Pressure Injury Prevention Bundle Q shift.  (Q2Hr turns, reposition Q1Hr while in chair, use air chair cushion, float heels off bed/chair surface, pad all medical devices using silicone border foam, pad bony prominence with multilayer silicone border foam)  Question:  Body Site  Answer:  Implement and continue Pressure Injury Plan of Care per Pressure Injury Prevention Policy and/or Unit Pressure Injury Prevention Bundle Q shift.      Wound Care  As directed       Comments: Sacrum 1. Cleanse wound with hypochlorous  acid (Vashe 118 ml DJE#[id number]; SAP# [id number]) moisten gauze  2. Apply No Sting barrier film (SAP # [id number]) to protect periwound skin.  3. Cover with silicone foam bordered dressing (SAP# [phone] - 9x9 large sacral) 4. Change every 3 days and PRN soilage or dislodgement  Question:  Body Site  Answer:  Sacrum      Wound Care  As directed       Comments: Scrotum 1. Cleanse wound with hypochlorous acid (Vashe 118 ml DJE#643928; SAP# [id number]) moisten gauze   2. Apply antifungal ointment (SAP [id number]) to scrotum BID and PRN incontinent episode  Question:  Body Site  Answer:  Scrotum              _____________________  Time spent on discharge process: 35 minutes    ISAIAH CHRISTELLA SESSIONS, MD, PhD [facility]  04/03/2024   Hospital Contact Information:  [facility] [place]) [facility]) [facility])  Pending tests:  Laboratory: [phone] Microbiology: [phone] Pathology: [phone] Radiology: [phone]  General questions: [phone] Pending tests: Laboratory: [phone] Microbiology: [phone] Pathology: [phone] Radiology: [phone]  General questions:  [phone] Pending tests:  Laboratory: [phone] Microbiology: [phone] Pathology: [phone] Radiology: [phone]  General questions:  [phone] Pending tests: Laboratory: [phone] Microbiology: [phone] Pathology: [phone] Radiology: [phone]  General questions:  [phone]    ------------------------------------------------------------------------------- Attestation signed by Bryan Slater Macintosh, MD at 04/03/2024  6:01 PM Attestation Statement:   I personally saw and evaluated the patient, and participated in the management and treatment plan as documented in the resident/fellow note.  SLATER SHAUNNA MARGET,  MD  ------------------------------------------------------------------------------- "

## 2024-04-01 NOTE — Progress Notes (Signed)
 " General Medicine Daily Progress Note   Author: ISAIAH CHRISTELLA SESSIONS, MD  Hospital Admission Date: 03/30/2024  Date of Service: 04/01/2024     Attending of Record: Marget Slater Macintosh, MD   PCP: Jyl Heron Haff, MD   Bryan Howard is a 89 y.o. male who is currently Hospital Day: 3 with principal problem of UTI (urinary tract infection)  PATIENT ID:  Bryan Howard is a 89 y.o. male w/ hx of obesity (BMI 30), HTN, HL, OSA (not on CPAP), HFpEF, moderate AS, prior PE (2024, no longer on [facility]), 1st-degree heart block, non-obstructive CAD, TIA, AAA s/p endovascular repair (2019), hypothyroidism, prior diverticulitis, chronic pain (on maintenance opiate therapy), prostate cancer s/p cryoablation, and recurrent UTIs with multiple recent admission for recurrent UTI who presents with generalized weakness and recurrent UTI.  Interval History: - continued on meropenem  for short course per ID - more drowsy this AM - speech reassess, now on L4 diet   REVIEW OF SYSTEMS: all systems reviewed, negative except as noted   Current Vital Signs 24h Vital Sign Ranges  T 36.8 C (98.2 F) Temp  Avg: 36.7 C (98.1 F)  Min: 36.6 C (97.8 F)  Max: 36.8 C (98.2 F)  BP (!) 117/93 BP  Min: 103/73  Max: 125/85  HR 75 Pulse  Avg: 85.2  Min: 75  Max: 90  RR 18 Resp  Avg: 19.8  Min: 18  Max: 22  SaO2 92 % Nasal cannula SpO2  Avg: 93.8 %  Min: 92 %  Max: 96 %       24 Hour I/O Current Shift I/O  Time Ins Outs 12/13 0701 - 12/14 0700 In: 704.3 [P.O.:50; I.V.:30] Out: 1600 [Urine:1600] 12/14 0701 - 12/14 1900 In: 165.9 [I.V.:30] Out: -        Weights   First 74.5 kg (164 lb 3.9 oz)   Last 73.3 kg (161 lb 11.2 oz)    GEN:  Drowsy, somnolent. Opens eyes to touch.  Non-toxic appearing. No acute distress.  CHEST:  Normal effort.  Clear to auscultation, without rales, rhonchi, wheezing or diminished breath sounds. CARDIAC:  Rhythm is regular.  Normal S1 and S2. No gallop or murmurs.   ABDOMEN:  Positive bowel sounds.  Soft, nondistended, nontender.  No guarding or rebound.   EXTREMITIES:  No c/c/e. 2+ distal pulses  SCHEDULED MEDICATIONS  acetaminophen , 650 mg, Oral, Daily ascorbic acid (vitamin C), 500 mg, Oral, BID atorvastatin , 80 mg, Oral, QHS celecoxib, 200 mg, Oral, BID cholecalciferol , 1,000 Units, Oral, Daily cyanocobalamin , 1,000 mcg, Oral, Daily DULoxetine , 60 mg, Oral, Daily gabapentin , 300 mg, Oral, QHS heparin  (porcine), 5,000 Units, Subcutaneous, Q8H SCH HYDROcodone -acetaminophen , 1 tablet, Oral, Daily levothyroxine , 100 mcg, Oral, Daily before breakfast (0630) magnesium  sulfate, 4 g, Intravenous, Once meropenem  (MERREM ) IV Traditional Therapy, 500 mg, Intravenous, Q8H pantoprazole, 20 mg, Oral, BID AC polyethylene glycol, 17 g, Oral, Daily    MEDICATION INFUSIONS     PRN MEDICATIONS  acetaminophen , 650 mg, Oral, Q6H PRN carboxymethylcellulose, 2 drop, Both Eyes, TID PRN lidocaine , 0.5 mL, Subcutaneous, As Directed melatonin, 3 mg, Oral, QHS PRN menthol, 1 lozenge, Buccal, TID PRN sennosides-docusate, 2 tablet, Oral, BID PRN     Recent Labs  Lab 03/30/24 2049 03/31/24 1005 04/01/24 0624  NA 141 140 139  K 4.8 4.4 3.8  CL 104 103 105  CO2 27 29 27   BUN 26* 20 25*  CREATININE 1.0 0.9 0.9  GLUCOSE 85 98 100  Recent Labs  Lab 03/30/24 2049 03/31/24 1005 04/01/24 0624  WBC 6.8 6.2 6.0  HGB 11.4* 10.3* 10.5*  HCT 35.1* 31.6* 31.6*  PLT 170 151 142*    Recent Labs  Lab 03/30/24 2049 03/31/24 1005 04/01/24 0624  CALCIUM  10.0 9.8 9.6  MG  --  1.8 1.7*   No results for input(s): INR, APTT in the last 168 hours.   Recent Labs  Lab 03/30/24 2049  ALKPHOS 81  ALT 23  AST 22     No results for input(s): CHOLTOTAL, HDL, LDLCALC, TRIG in the last 168 hours. No results for input(s): HGBA1C, TSH, T4FREE, PROBNP in the last 168 hours.  No results for input(s): POCGLU in the last 168 hours.    Micro:    Lab Results  Component Value Date   URCULT >=100,000 CFU/mL Escherichia coli (!) 03/15/2024   URCULT (!) 03/15/2024    1,000-10,000 CFU/mL Klebsiella pneumoniae (Klebsiella pneumoniae complex)     Imaging: Reviewed  Assessment: [name] is a 89 y.o. male w/ hx of obesity (BMI 30), HTN, HL, OSA (not on CPAP), HFpEF, moderate AS, prior PE (2024, no longer on [facility]), 1st-degree heart block, non-obstructive CAD, TIA, AAA s/p endovascular repair (2019), hypothyroidism, prior diverticulitis, chronic pain (on maintenance opiate therapy), prostate cancer s/p cryoablation, and recurrent UTIs with multiple recent admission for recurrent UTI who presents with generalized weakness and recurrent UTI.  Principal Problem:   UTI (urinary tract infection) Active Problems:   OSA (obstructive sleep apnea)   Ureterolithiasis, left   Hx of transient ischemic attack (TIA) 12/12   Hx of abdominal aortic aneurysm   Transfusion of blood product refused for religious reason   Anemia of chronic disease   Prostate cancer (CMS/HHS-HCC)   Plan: H/o recurrent UTIs/recent ESBL Klebsiella pneumoniae UTI  Pyuria Pt with recurrent UTIs including recent admission for septic shock in the setting of ESBL Klebsiella UTI, now with weakness and dysuria with pyuria on U/A. As with most recent admission, given lack of alternative explanation for generalized weakness and fatigue of relatively acute onset, will treat for recurrent UTI with meropenem  given recent ESBL klebsiella on urine culture. No CVA tenderness or imaging evidence of pyelo. No evidence of fistula based on review of prior workup; no evidence of prostatits based on ID's DRE exam. Query whether there is a stone present and thus we don't have source control. Appreciate ID's assistance. - ID consulted - meropenem  12/13 -  [ ]  fu urine cultures & blood cultures [ ]  consider methenamine  outpatient to prevent recurrece   Generalized Weakness Recent  admission, patient presented with relatively acute-onset generalized weakness without localizing symptom and with minimal to mild improvement in generalized weakness after treatment for UTI. He was recommended for SNF but chose to discharge home with HHPT/OT.  - PT/OT consult   Chronic Back Pain Pt with chronic back pain, prior spinal surgery, is on acetaminophen , hydrocodone , duloxetine , gabapentin , and celecoxib at home.  - Continue home regimen   HLD - Continue home atorvastatin    Hypothyroidism - Continue home levothyroxine    GERD - Continue pantoprazole in lieu of home omeprazole   Jehovah's Witness Status Pt is a Jehovah's Witness, decline all blood products. Hgb currently 11.4* (12/12).  - Blood conservation consult in place - Continue home B12 and folate supplements - Last admission: Iron  levels 46, ferritin 44 and tsat 20; can consider iron  transfusion  Patient Lines/Drains/Airways Status     Active Lines,Drains,Airways  Name Placement date Placement time Site Days   Peripheral IV 03/30/24 Left Antecubital 03/30/24  1639  Antecubital  1   Peripheral IV 03/30/24 Right Forearm 03/30/24  2051  Forearm  1   Peripheral IV 03/30/24 Left Forearm 03/30/24  2121  Forearm  1   Male External Urinary Containment 03/31/24  1613  --  less than 1   Pressure Injury (Ulcer) 03/31/24 Right Heel 03/31/24  2310  Heel  less than 1   Pressure Injury (Ulcer) 03/31/24 Mid Sacrum 03/31/24  2310  Sacrum  less than 1   Wound 12/10/23 Right;Anterior Groin 12/10/23  0010  Groin  113   Wound 12/10/23 Left;Proximal;Anterior Groin 12/10/23  0010  Groin  113   Wound 01/02/24 Urethra 01/02/24  1108  Urethra  90   Wound 03/31/24 Bilateral Pannus 03/31/24  2310  Pannus  less than 1   Wound 03/31/24 Bilateral Groin 03/31/24  2310  Groin  less than 1   Wound 03/31/24 Generalized Scrotum 03/31/24  2310  Scrotum  less than 1             Isaiah Sessions, MD, PhD Internal Medicine,  PGY-3   ------------------------------------------------------------------------------- Attestation signed by Marget Slater Macintosh, MD at 04/01/2024  3:48 PM Attestation Statement:   I personally saw and evaluated the patient, and participated in the management and treatment plan as documented in the resident/fellow note.  Will need to define duration of course of meropenem  (3 days? Would end tomorrow in that case; or 5 days? Would continue through Wednesday).  Current recommendation for post-hospitalization level of care from PT and OT is for skilled care (additional rehab) with PT and OT.  SLATER SHAUNNA MARGET, MD  ------------------------------------------------------------------------------- "

## 2024-04-02 NOTE — Progress Notes (Signed)
 "   Infectious Diseases Consultation Note   ADMIT DATE: 03/30/2024 DATE OF CONSULTATION: 04/02/2024 SERVICE REQUESTING CONSULTATION: Internal Medicine REASON FOR CONSULTATION: complicated urinary tract infection CHIEF COMPLAINT: recurrent UTI, dysuria present   Subjective   HISTORY OF PRESENT ILLNESS:  Bryan Howard is a 89 y.o. male with hypertension, OSA (not on CPAP), HFpEF, moderate AS, prior PE (2024, now off anticoagulation), 1st degree AVB, TIA, AAA s/p endovascular repair (2019), hypothyroidism, prostate cancer s/p cryoablation, and recurrent UTIs admitted with dysuria and generalized weakness.   Initial presentation Main complain on admission was generalized weakness accompanied by a burning pain when he urinates. He thinks this is distinct from his general difficulty urinating (which he has since prostate cancer diagnosis). Notably, he was recently admitted for the same - last treated with gentamicin x1 for cystitis. He is chronically incontinent and usually in pullups at hoome.   With this admission, he has been afebrile, free of leukocytosis, and with negative blood cultures to date.   Of note, given extensive history of suspected polymicrobial UTIs and intermittent air on CT imaging, he was evaluated for a possible colovesicular fistula with an initial cystoscopy showing debris concerning for fistula (though not definitive) and  later repeat cystoscopy no longer showed signs of fistula. He eventually had a negative cystogram at [facility] in November.   Follow up today:  Patient was a challenging historian giving limited responses to my questions and not fully participating in the interview. He reports no new symptoms though cannot describe why exactly he feels bad. He does say the dysuria is unchanged. He has remained afebrile and HD stable.    Antibiotics:   Meropenem  11/27-11/28, 12/13-present   Cefepime  11/27-11/28   Gentamicin 12/2   Amoxicillin -clavulanate for  multiple courses  REVIEW OF SYSTEMS: A complete 10-system review of systems is negative except as noted above and for the following: Negative except as noted above  PAST MEDICAL HISTORY: Past Medical History:  Diagnosis Date   Advance directive on file 12/14/2011   Advance directive on file 12/14/2011   Barrett's esophagus    hx, with resolution on his repeat.   Cervical stenosis of spine    Choledocholithiasis 03/2016   Underwent ERCP with sphincterotomy   Chronic obstructive pulmonary disease, unspecified COPD type (CMS/HHS-HCC) 11/10/2022   Congestive heart failure, unspecified HF chronicity, unspecified heart failure type (CMS/HHS-HCC) 11/10/2022   Coronary atherosclerosis 10/13/2007   Depression    Foot drop, right 02/26/2015   GERD (gastroesophageal reflux disease) 08/02/2011   Gout, joint    Hepatitis    as child   History of cataract    Hyperlipidemia    Hypertension    Neuropathy    OSA (obstructive sleep apnea) 09/08/2011   no sleep study at this time   Osteoarthritis    Prostate cancer (CMS/HHS-HCC)    Refusal of blood transfusions as patient is Jehovah's Witness 12/14/2011   Sleep apnea    Stroke (CMS/HHS-HCC)    TIA approx. 10 years - no residual effects   Thyroid disease    TIA (transient ischemic attack)    04/20/2011   Transfusion of blood product refused for religious reason 12/14/2011   Past Surgical History:  Procedure Laterality Date   INGUINAL HERNIA REPAIR Right 1963   Lumbar laminectomy L4-L5  1973   REPAIR ABDOMINAL AORTIC ANEURYSM  2008   CRYOSURGERY PROSTATE N/A 12/28/2011   Procedure: CRYOSURGERY PROSTATE;  Surgeon: Debby Lynwood Mends, MD;  Location: [facility] OR;  Service:  Urology;  Laterality: N/A;   CYSTOURETHROSCOPY N/A 12/28/2011   Procedure: CYSTOURETHROSCOPY;  Surgeon: Debby Lynwood Mends, MD;  Location: [facility] OR;  Service: Urology;  Laterality: N/A;   CYSTOURETHROSCOPY W/INSERTION URETHRAL  STENT N/A 12/28/2011   Procedure: CYSTOURETHROSCOPY W/INSERTION URETHRAL STENT;  Surgeon: Debby Lynwood Mends, MD;  Location: [facility] OR;  Service: Urology;  Laterality: N/A;   ESOPHAGOGASTRODOUDENOSCOPY W/BIOPSY N/A 02/20/2014   Procedure: EGD;  Surgeon: Charlie Franky Debarah Mickey., MD;  Location: [facility];  Service: Gastroenterology;  Laterality: N/A;   LAMINECTOMY POSTERIOR CERVICLE DECOMP W/FACETECTOMY & FORAMINOTOMY Bilateral 12/11/2014   Procedure: **AIRO #1** C6-T2 laminectomy, C3-T2 fusion/stabilization with AIRO guidance;  Surgeon: Cena Rankin Gaines, MD;  Location: [facility];  Service: Neurosurgery;  Laterality: Bilateral;   LAMINECTOMY POSTERIOR CERVICLE DECOMP W/FACETECTOMY & FORAMINOTOMY Bilateral 12/11/2014   Procedure: LAMINEC/FACETECT/FORAMIN,EACH ADDNL;  Surgeon: Cena Rankin Gaines, MD;  Location: [facility];  Service: Neurosurgery;  Laterality: Bilateral;   POSTERIOR CERVICLE SPINE FUSION MULTIPLE LEVELS Bilateral 12/11/2014   Procedure: POSTERIOR CERVICLE SPINE FUSION MULTIPLE LEVELS;  Surgeon: Cena Rankin Gaines, MD;  Location: [facility];  Service: Neurosurgery;  Laterality: Bilateral;   ARTHRODESIS POSTERIOR CERVICLE SPINE Bilateral 12/11/2014   Procedure: ARTHRODESIS POSTERIOR CERVICLE SPINE;  Surgeon: Cena Rankin Gaines, MD;  Location: [facility];  Service: Neurosurgery;  Laterality: Bilateral;   INSTRUMENTATION POSTERIOR SPINE 7 TO 12 VERTEBRAL SEGMENTS Bilateral 12/11/2014   Procedure: POSTERIOR SEGMENTAL INSTRUMENTATION (EG, PEDICLE FIXATION, DUAL RODS WITH MULTIPLE HOOKS AND SUBLAMINAR WIRES); 7 TO 12 VERTEBRAL SEGMENTS (LIST IN ADDITION TO PRIMARY PROCEDURE);  Surgeon: Cena Rankin Gaines, MD;  Location: [facility];  Service: Neurosurgery;  Laterality: Bilateral;   INSERTION MORSELIZED BONE ALLOGRAFT FOR SPINE SURGERY Bilateral 12/11/2014   Procedure: INSERTION MORSELIZED BONE ALLOGRAFT FOR  SPINE SURGERY;  Surgeon: Cena Rankin Gaines, MD;  Location: [facility];  Service: Neurosurgery;  Laterality: Bilateral;   AUTOGRAFT OBTAINED SAME INCISION FOR SPINE SURGERY Bilateral 12/11/2014   Procedure: AUTOGRAFT OBTAINED SAME INCISION FOR SPINE SURGERY;  Surgeon: Cena Rankin Gaines, MD;  Location: [facility];  Service: Neurosurgery;  Laterality: Bilateral;   LAMINECTOMY POSTERIOR LUMBAR FACETECTOMY & FORAMINOTOMY W/DECOMP Bilateral 06/11/2015   Procedure: Lumbar three-five laminectomy with Cell Saver;  Surgeon: Cena Rankin Gaines, MD;  Location: [facility];  Service: Neurosurgery;  Laterality: Bilateral;   LAMINECTOMY POSTERIOR CERVICLE DECOMP W/FACETECTOMY & FORAMINOTOMY Bilateral 06/11/2015   Procedure: LAMINECTOMY, FACETECTOMY AND FORAMINOTOMY, SINGLE VERTEBRAL SEGMENT; EACH ADDITIONAL SEGMENT, CERVICAL, THORACIC, OR LUMBAR (LIST IN ADDITION TO PRIMARY PROCEDURE);  Surgeon: Cena Rankin Gaines, MD;  Location: [facility];  Service: Neurosurgery;  Laterality: Bilateral;   ENDOSCOPIC RETROGRADE CHOLANGIO-PANCREATOGRAPHY W/SPHINCTEROTOMY N/A 04/07/2016   Procedure: ENDOSCOPIC RETROGRADE CHOLANGIOPANCREATOGRAPHY (ERCP); WITH SPHINCTEROTOMY/PAPILLOTOMY;  Surgeon: Deward Ray Napoleon, MD;  Location: [facility];  Service: Gastroenterology;  Laterality: N/A;   ENDOSCOPIC RETROGRADE CHOLANGIO-PANCREATOGRAPHY W/EXTRACTION STONE N/A 04/07/2016   Procedure: ENDOSCOPIC RETROGRADE CHOLANGIOPANCREATOGRAPHY (ERCP); WITH REMOVAL OF CALCULI/DEBRIS FROM BILIARY/PANCREATIC DUCT(S);  Surgeon: Paul Simon Jowell, MD;  Location: [facility];  Service: Gastroenterology;  Laterality: N/A;   ENDOSCOPY OF BILIARY DUCT  04/07/2016   Procedure: ENDOSCOPIC CATHETERIZATION OF THE BILIARY DUCTAL SYSTEM, RADIOLOGICAL SUPERVISION AND INTERPRETATION;  Surgeon: Deward Ray Napoleon, MD;  Location: [facility];  Service: Gastroenterology;;    CORI W/BIOPSY  11/24/2017   Procedure: ESOPHAGOGASTRODUODENOSCOPY, FLEXIBLE, TRANSORAL; WITH BIOPSY, SINGLE OR MULTIPLE;  Surgeon: Louann [name] Hora, MD;  Location: [facility];  Service: Gastroenterology;;   LAMINECTOMY POSTERIOR LUMBAR  1/2 SEGMENTS Right 08/02/2019   Procedure: Right L2-L3 Minimally Invasive Laminectomy;  Surgeon: Than, Rodrick Boer, MD;  Location: [facility] OR;  Service: Neurosurgery;  Laterality: Right;   ORCHIECTOMY Right 01/30/2022   Procedure: ORCHIECTOMY, SIMPLE (INCLUDING SUBCAPSULAR), WITH OR WITHOUT TESTICULAR, PROSTHESIS, SCROTAL OR INGUINAL APPROACH;  Surgeon: Maryanne Reyes Rana, MD;  Location: [facility] OR;  Service: Urology;  Laterality: Right;   ESOPHAGOGASTRODOUDENOSCOPY W/BIOPSY N/A 07/07/2022   Procedure: ESOPHAGOGASTRODUODENOSCOPY, FLEXIBLE, TRANSORAL; WITH BIOPSY, SINGLE OR MULTIPLE;  Surgeon: Dorris Nat Sours, MD;  Location: [facility];  Service: Gastroenterology;  Laterality: N/A;   COLONOSCOPY W/REMOVAL LESIONS BY SNARE N/A 07/07/2022   Procedure: COLONOSCOPY, FLEXIBLE; WITH REMOVAL OF TUMOR(S), POLYP(S), OR OTHER LESION(S) BY SNARE TECHNIQUE;  Surgeon: Dorris Nat Sours, MD;  Location: [facility];  Service: Gastroenterology;  Laterality: N/A;   CYSTOURETHROSCOPY N/A 01/02/2024   Procedure: CYSTOURETHROSCOPY (SEPARATE PROCEDURE);  Surgeon: Scales, Carlin Vicenta Raddle., MD;  Location: [facility] OR;  Service: Urology;  Laterality: N/A;   APPENDECTOMY     Bilateral cataract surgery     CATARACT EXTRACTION     JOINT REPLACEMENT Bilateral 2003, 2004   bilateral knee replacement    PROSTATE SURGERY  cryo ablation   REPAIR ROTATOR CUFF TEAR ACUTE OPEN Left    ALLERGIES: Allergies  Allergen Reactions   Captopril Cough   Quinolones Other (See Comments)    AAA patient- avoid if possible   CURRENT MEDICATIONS: Medication List & Administration Record reviewed on rounds  Scheduled Meds:   acetaminophen   650 mg  Oral Daily   ascorbic acid (vitamin C)  500 mg Oral BID   atorvastatin   80 mg Oral QHS   celecoxib  200 mg Oral BID   cholecalciferol   1,000 Units Oral Daily   cyanocobalamin   1,000 mcg Oral Daily   DULoxetine   60 mg Oral Daily   gabapentin   300 mg Oral QHS   heparin  (porcine)  5,000 Units Subcutaneous Q8H SCH   HYDROcodone -acetaminophen   1 tablet Oral Daily   levothyroxine   100 mcg Oral Daily before breakfast (0630)   meropenem  (MERREM ) IV Traditional Therapy  500 mg Intravenous Q8H   pantoprazole  20 mg Oral BID AC   polyethylene glycol  17 g Oral Daily   Infusions:  PRNs: acetaminophen , carboxymethylcellulose, lidocaine , melatonin, menthol, sennosides-docusate  PROBLEM LIST: Principal Problem:   UTI (urinary tract infection) Active Problems:   OSA (obstructive sleep apnea)   Ureterolithiasis, left   Hx of transient ischemic attack (TIA) 12/12   Hx of abdominal aortic aneurysm   Transfusion of blood product refused for religious reason   Anemia of chronic disease   Prostate cancer (CMS/HHS-HCC) Resolved Problems:   * No resolved hospital problems. *  Immunizations: Immunization History  Administered Date(s) Administered   [employer] Monovalent Vaccine 06/26/2019, 07/16/2019, 04/01/2020   COVID-19 vaccine >12 years (Pfizer-[employer]ion[employer], Bivalent) IM Injection 30 mcg/0.3mL 02/09/2021   PNEUMOCOCCAL (PCV13) (BIRTH-68YR) VACCINE (PREVNAR 13) 03/11/2014, 03/11/2014   PNEUMOCOCCAL (PPSV23)(>=75YRS -OR- >=2 YRS WITH RISK) VACCINE (PNEUMOVAX 23) 04/19/2008   TDAP (>=14YR) VACCINE (ADACEL/BOOSTRIX) 09/15/2010    SOCIAL HISTORY: Place of residence and approximate distance from [facility]: [address] [place] [address] Tobacco:  reports that he quit smoking about 47 years ago. His smoking use included cigarettes. He started smoking about 66 years ago. He has a 55.5 pack-year smoking history. He has never used smokeless tobacco. Alcohol:  reports  that he does not currently use alcohol. Illicit drug use: none Sexual history:  reports never being sexually active. Travel (location, approximate date and time spent): none recently   FAMILY HISTORY: family history includes Aneurysm in his sister; Arthritis in an other family member; COPD in his sister; Coronary Artery Disease (Blocked arteries around heart) in his father and mother; Heart disease in his father and sister; Lung cancer in his sister and sister; Nephrolithiasis in an other family member; Prostate cancer in his cousin.  Objective   PHYSICAL EXAMINATION: Vitals:   04/02/24 1534  BP: 110/62  Pulse: 76  Resp: 18  Temp: 36.9 C (98.4 F)   Temp (24hrs), Avg:37.1 C (98.8 F), Min:36.9 C (98.4 F), Max:37.4 C (99.3 F)  Ht Readings from Last 1 Encounters:  03/31/24 167.6 cm (5' 5.98)   Wt Readings from Last 1 Encounters:  03/31/24 73.3 kg (161 lb 11.2 oz)   I/O last 3 completed shifts: In: 1237 [P.O.:680; I.V.:60; IV Piggyback:497] Out: 1950 [Urine:1950]  General: alert, cooperative, chronically ill appearing  Psych: oriented to time, place and person, mood and affect are approprate Eyes: PERRL, EOMI, conjunctiva clear, anicteric sclera Neck: supple and no adenopathy HENT: oropharynx clear, moist mucous membranes Cardiovascular: regular rate and rhythm, without murmurs, rubs or gallops Respiratory: clear to auscultation, good air exchange Abdomen: soft, nontender, nondistended, normoactive bowel sounds , no CVA tenderness, no significant bladder tenderness GU: no bogginess or tenderness on prostate exam  Extremities: no lower extremity edema Skin: no rashes or lesions  MICROBIOLOGY:   05/18/23: Urine cx:   1) >100,000 Aerococcus urinae   2) 1,000-10,000 mixed flora    07/10/23: Urine cx:   1) >100,000 Aerococcus urinae   2) 1,000-10,000 mixed flora    07/31/23: Urine cx: >100,000 Enterococcus faecium (AmpR, DaptoS, LinezS, NitroI, TetI, VancS)    09/03/23:  Urine cx:   1) >100,000 Aerococcus urinae   2) 50,000-100,000 Enterococcus faecium    09/25/23: Blood cx: No growth x2   09/25/23: Urine cx:   1) Klebsiella pneumoniae >100,000 (AmiS, AmoxClavS, AmpR, CefS, CefuroxS, CiproS, NitroS, PiptazoS, TetS, TobraS, BactrimS)   2) Aerococcus urinae >100,000   10/18/23: Urine cx:   1) Enterococcus faecium (AmpR, VancS)   2) Mixed flora    12/09/23: Urine cx:   1) E coli (AmiS, AmpR, CefS, CefuroxS, CiproS, GentS, NitroS, PiptazoS, TmpSmxS)     2) Mixed flora    01/02/24: Urine cx: Mixed flora    03/15/24: Blood cx: No growth x2   03/15/24: Urine cx:   1) E coli (AmiS, AmpR, CefS/I, CefuroxS, CiproS, GentS, LevoS, NitroS, PiptazoS, TobraS)   2) Klebsiella pneumoniae (AmiS, AmoxClavS, AmpSulI, CefR, CefeR, CtxR, CiproR, ErtaS, MeroS, NitroI, TobraS, TmpSmxR)    03/30/24: Blood cx: No growth x2   03/31/24: Urine cx: Pending   LABORATORY STUDIES:  Labs in chart were reviewed. Lab Results  Component Value Date   WBC 5.6 04/02/2024   HGB 10.4 (L) 04/02/2024   HCT 31.6 (L) 04/02/2024   PLT 118 (L) 04/02/2024   Lab Results  Component Value Date   NA 139 04/02/2024   K 4.1 04/02/2024   CL 103 04/02/2024   CO2 27 04/02/2024   BUN 21 (H) 04/02/2024   CREATININE 0.9 04/02/2024   GLUCOSE 102 04/02/2024   Lab Results  Component Value Date   CALCIUM  9.2 04/02/2024   MG 2.2 04/02/2024   PHOS 2.9 12/09/2023   Lab Results  Component Value Date   AST 22 03/30/2024   ALT 23 03/30/2024   ALKPHOS 81 03/30/2024  TBILI 0.7 03/30/2024   CONJBILI 0.3 12/11/2017   ALB 3.6 03/30/2024   TOTALPROTEIN 7.4 03/30/2024   Lab Results  Component Value Date   APTT 28.9 03/15/2024   INR 0.9 03/15/2024   Lab Results  Component Value Date   COLORU Yellow 03/31/2024   CLARITYU Cloudy (!) 03/31/2024   SPECGRAV 1.020 03/31/2024   PHUR 7.0 10/24/2009   GLUCOSEU Negative 03/31/2024   KETONESU 1+ (!) 03/31/2024   BLOODU Negative 03/31/2024   NITRITE Positive  (!) 03/31/2024   LEUKOCYTESUR 3+ (!) 03/31/2024   BILIRUBINUR Negative 03/31/2024   UROBILINOGEN 0.2 03/31/2024   RBCUA 0 03/31/2024   WBCUA >182 (H) 03/31/2024   SQUAMEPI 1 03/31/2024   CASTUA 0 01/08/2020   BACTERIA >50 (!) 10/01/2019   IMAGING:    03/05/24: CT cystogram: IMPRESSION:  1. No evidence of bladder leak or fistula.  2. Bladder wall thickening with mild trabeculation, likely due to  chronic bladder outlet obstruction.  3. Nonobstructing bilateral renal calculi.  4. Cholelithiasis without evidence of cholecystitis or biliary  dilatation.  5. Distal colonic diverticulosis without evidence of acute  inflammation.  6. Aortic Atherosclerosis (ICD10-I70.0).     03/15/24: CT: Impression: 1.  No acute intra-abdominal abnormality. 2.  Similar small volume of air within the bladder, fistula not excluded.  3.  Diverticulosis without evidence of diverticulitis.    03/20/24: XR gastrograffin: Impression:  No definite colovesical fistula to the level of the proximal descending colon was identified on this study. If there is ongoing clinical concern, consider further evaluation with CT cystogram once enteric contrast has cleared from the lower gastrointestinal system.    03/30/24: CT brain: IMPRESSION:  1.  No acute intracranial abnormalities.  2.  Unchanged ventriculomegaly with enlargement of the sylvian fissures and mildly narrowed callosal angle. These findings are nonspecific but can be seen in the setting of normal pressure hydrocephalus in the correct clinical setting.  Assessment & Plan   ASSESSMENT: 89 y.o. male with hypertension, OSA (not on CPAP), HFpEF, moderate AS, prior PE (2024, now off anticoagulation), 1st degree AVB, TIA, AAA s/p endovascular repair (2019), hypothyroidism, prostate cancer s/p cryoablation, and recurrent UTIs admitted with dysuria and generalized weakness.   He continues to be systemically afebrile without leukocytosis and symptoms that do  not appear to have responded to empiric therapy thus far.  Rather than resistant infection this makes me more suspicious his symptoms are unrelated to infection at all.  I worry the urine culture was lost as it is still listed as being at [facility] and not having even made it to the lab.  Can continue the empiric therapy for now but short course will be strongly preferred especially in setting of his repeated issues (longer or repeated exposures to antibiotics will only increase risk for resistance - Cai et al CID 2015).   If the sample is lost I would favor again just giving him the single dose aminoglycoside and completing his therapy.   There is no role for antibiotic suppression when the natural history of repeated UTI in setting of chronic prostate cancer, incomplete voiding, and urinary incontinence pretty much assures the urine will never be free of bacteria.   With his extensive polymicrobial growth history, prior concern for colo-vesicular fistula is noted - however I see that he's undergone cystoscopy at least twice, a CT cystogram once, and a gastrograffin study once, each without confirming fistula. I did also wonder about prostatitis, though he has no bogginess or tenderness on  exam and these are polymicrobial infections with distinct organisms - not suggestive of persistence within a single nidus (as one might see with perhaps a prostatic abscess for example).   Since chronic antibiotics are unlikely to have any benefit but highly likely to simply result in eventually untreatable resistance, probably the only option once might consider - if no one can address his outlet obstruction and incontinence issues - might be methenamine  after completion of this course Davina dunker al BMJ 2022) as at least this will not trigger ever increasing loss of treatment options.   RECOMMENDATIONS:  1) Cystitis:    - OK to continue meropenem  pending culture result   - Anticipate short course appropriate  for cystitis if the Ucx is indeed lost would plan on single dose tobramycin to complete course   - Would recommend against oral antibiotic suppression   - Could offer methenamine  hippurate 1 g PO Q12h for prevention trial   ID will continue to follow Reach out to Gen ID Team 2 for questions      Attestation Statement:   I personally performed the service. (TP)  ALBERTEEN BURNET, MD   "

## 2024-04-03 NOTE — Discharge Summary (Addendum)
 Discharge to Outside Facility: RN  Report called to [facility] by Waddell at 1600 Discharge Unit: [id number] Discharge Unit Phone #: [phone]  Transport Equipment/Life Support Measures Needed                    Oxygen: via O2 Device: Nasal cannula (03/31/24 1037)   Pulse Oximeter: SpO2: 91 % (04/03/24 0739)      Precautions/Positioning:    Vital Signs and Patient Condition At Time of Transfer    Vitals:     Vitals:   04/03/24 0736  BP: 134/67  Pulse: 89  Resp: 18  Temp: 36.9 C (98.5 F)     Mental Status:   (WDL = Within Defined Limits)           Orientation Level: Oriented to person, Confused (04/03/24 0900)   Cognition: Poor judgement, Poor safety awareness, Poor attention/concentration (04/03/24 0900)   Communication: Verbal (04/03/24 0900)   Pain:    Pain Assessment %%: 0-10 (04/03/24 0930)    Pain Score %%: 0-No pain (04/03/24 0930)    PAINAD Score: 0 (04/01/24 2030)  Nursing Summary    Activities of Daily Living:        Mobility: Slightly limited (04/03/24 0900)   Activity: Chairfast (04/03/24 0900)   Sensory Aides:   Vision - Corrective Lenses: Glasses (03/31/24 2305)        Elimination:       Stool Occurrence: 0 (04/01/24 2030)   Urinary Incontinence: Yes (04/03/24 0900)   Bowel Incontinence: Yes (04/03/24 0900)   Nutrition:   Does the pt require assistance with eating?: Able to feed self (set-up) (04/03/24 0900)           Skin Condition / Wounds:     Pressure Injury (Ulcer) 03/31/24 Right Heel (Active)  Wound Image    03/31/24 2310  Wound Assessment Dry 04/03/24 0900  Peri-wound Assessment Blanchable erythema 04/02/24 1944  Drainage Amount None 04/01/24 0956  Dressing Type Open to air 04/03/24 0900  Dressing Status/ Intervention Open to Air 04/03/24 0900  Number of days: 3     Pressure Injury (Ulcer) 03/31/24 Mid Sacrum (Active)  Wound Image   03/31/24 2310  Wound Assessment Dressing change not due 04/02/24 1944  Peri-wound  Assessment Pink 03/31/24 2310  Drainage Amount Scant 03/31/24 2310  Drainage Description Sanguineous 03/31/24 2310  Dressing Type Specialty dressing 04/01/24 0956  Specialty Dressing Type Sacral shaped silicone foam border 04/01/24 0956  Dressing Status/ Intervention Clean, Dry, Intact 04/02/24 1944  Dressing Last Changed 03/31/24 04/02/24 1944  Number of days: 3     Wound 12/10/23 Right;Anterior Groin (Active)  Number of days: 115     Wound 12/10/23 Left;Proximal;Anterior Groin (Active)  Number of days: 115     Wound 01/02/24 Urethra (Active)  Dressing Type Open to air 03/20/24 0800  Number of days: 92     Wound 03/31/24 Bilateral Pannus (Active)  Wound Image    03/31/24 2310  Wound Assessment Moist;Pink 04/03/24 0900  Peri-wound Assessment Pink;Flaky/ peeling;Pale 04/02/24 1944  Closure None 04/02/24 1944  Drainage Amount Scant 03/31/24 2310  Drainage Description Serosanguineous 03/31/24 2310  Dressing Type Specialty dressing 04/03/24 0900  Specialty Dressing Type Silver moisture wicking fabric 04/03/24 0900  Topical Silver moisture - wicking fabric 04/03/24 0900  Dressing Status/ Intervention Clean, Dry, Intact 04/03/24 0900  Dressing Last Changed 04/01/24 04/02/24 1944  Number of days: 3     Wound 03/31/24 Bilateral Groin (Active)  Wound Image    03/31/24  2310  Wound Assessment Pink;Red 04/03/24 0900  Peri-wound Assessment Flaky/ peeling 04/03/24 0900  Closure None 04/01/24 0956  Drainage Amount Scant 04/01/24 2030  Drainage Description Sanguineous 04/01/24 2030  Dressing Type Open to air 04/03/24 0900  Topical Prescribed Ointment/Cream (ilex, colonel's butt paste, ketoconazole, silver sulfadiazin) 04/03/24 0900  Dressing Status/ Intervention Open to Air 04/03/24 0900  Number of days: 3     Wound 03/31/24 Generalized Scrotum (Active)  Wound Image   03/31/24 2310  Wound Assessment Moist;Pink 04/02/24 1944  Peri-wound Assessment Excoriated;Flaky/ peeling 04/02/24  1944  Closure None 04/02/24 1944  Drainage Amount Scant 04/01/24 2030  Drainage Description Serosanguineous 04/01/24 2030  Dressing Type Open to air 04/02/24 1944  Topical Barrier ointment 04/02/24 1944  Dressing Status/ Intervention Open to Air 04/02/24 1944  Number of days: 3    (RN to validate provider order for wound care)  Signed By: TAYLOR  GANCI

## 2024-04-03 NOTE — Discharge Summary (Addendum)
 Discharge to Outside Facility: Case Manager  Lehman, Whiteley  Birth Date:  [id number] Age:  89 y.o. Sex:  male Gender:  male  Admit Date/Time:  03/30/2024  4:50 PM  Rationale For Transfer: Change in Level of Care  Planning For Transfer    Discharge Date:  04/03/2024    Discharge Disposition:  Skilled nursing facility    Method of Transport: ambulance     Receiving Facility:          [facility]   - [address]  [place] [address]        Person Accepting:          Barnie Senters         Report called to Charge or care RN at:  [phone]                 Accepting Physician:      Facility MD         Referring Provider Pager #:       Provider Role Specialty Pager   * Marget Slater Macintosh, MD Attending Psychiatry * [phone]      Destination - Admitted Since 03/30/2024     Service Provider Services Address Phone Fax Patient Preferred   [facility] [address], [place] [address] [phone] Yes        Records Sent Demographic information Discharge to Outside Facility form Discharge summary H&P Progress notes (may include op notes, procedure notes, therapy notes, consults) Radiology reports Lab reports Medication administration record  Signed By: LYDIA B CATO  *Some images could not be shown.

## 2024-05-03 NOTE — Progress Notes (Signed)
 CHIEF COMPLAINT: F/u hematuria, recurrent UTI  HISTORY OF PRESENT ILLNESS: Bryan Howard is a 89 y.o. male who was initially referred for consultation by Jyl Heron Haff, MD for further evaluation of recurrent UTI.    He underwent cystoscopy in the OR on 01/02/2024 which demonstrated blanched mucosa but otherwise there was no definitive evidence of a colovesical or rectovesical fistula. He had a significant history of fecal impaction and constipation.  He has 2 very small renal stones, one in each kidney. His extensive evaluation for recurrent UTI has not identified an anatomic cause.   INTERVAL HISTORY: he returns today in follow up.    He was hospitalized at [facility] from 03/30/24 - 04/03/24 for a urinary tract infection.    During the hospitalization he was started on methenamine  to reduce the risk of recurrent UTI. Current dose is 1gm tablet PO QID per records from facility.    Overall he has done well since his hospitalization.  No fevers, chills, nausea or vomiting.  Continues to urinate on his own, diapers overnight.      MEDICATIONS: Current Outpatient Medications  Medication Sig Dispense Refill   acetaminophen  (TYLENOL ) 650 MG ER tablet Take one tablet every morning and two tablets nightly     alpha lipoic acid 200 mg Cap Take 600 mg by mouth at bedtime     ascorbic acid, vitamin C, (VITAMIN C) 500 MG tablet Take 500 mg by mouth 2 (two) times daily     atorvastatin  (LIPITOR) 80 MG tablet Take 1 tablet (80 mg total) by mouth once daily (Patient taking differently: Take 80 mg by mouth at bedtime) 100 tablet 3   celecoxib (CELEBREX) 200 MG capsule Take 1 capsule (200 mg total) by mouth 2 (two) times daily 60 capsule 5   cholecalciferol  (CHOLECALCIFEROL ) 1000 unit tablet Take 1,000 Units by mouth once daily     coenzyme Q10-vitamin E 100-5 mg-unit Cap Take 200 mg by mouth     cyanocobalamin  (VITAMIN B12) 1000 MCG tablet Take 1 tablet (1,000 mcg  total) by mouth once daily 90 tablet 3   DULoxetine  (CYMBALTA ) 60 MG DR capsule Take 1 capsule (60 mg total) by mouth once daily 90 capsule 3   folic acid  (FOLVITE ) 800 MCG tablet Take 1 tablet by mouth at bedtime     gabapentin  (NEURONTIN ) 300 MG capsule Take 1 capsule (300 mg total) by mouth at bedtime 100 capsule 2   HYDROcodone -acetaminophen  (NORCO) 7.5-325 mg tablet Take 1 tablet by mouth once daily     levothyroxine  (SYNTHROID ) 100 MCG tablet Take 1 tablet (100 mcg total) by mouth once daily Take on an empty stomach with a glass of water at least 30-60 minutes before breakfast. 90 tablet 3   methenamine  mandelate (MANDELAMINE) 1 GM tablet Take 1 tablet (1 g total) by mouth 4 (four) times daily     miconazole 2 % cream Apply topically once daily To scrotum     naloxone (NARCAN) 4 mg/actuation nasal spray Place 1 spray (4 mg total) into one nostril once as needed for up to 1 dose 1 each 0   omeprazole (PRILOSEC) 20 MG DR capsule Take 1 capsule (20 mg total) by mouth 2 (two) times daily before meals     phenazopyridine (PYRIDIUM) 100 MG tablet Take 100 mg by mouth 3 (three) times daily as needed     polyethylene glycol (MIRALAX ) packet Take 1 packet (17 g total) by mouth 2 (two) times daily Mix in 4-8ounces  of fluid prior to taking.     No current facility-administered medications for this visit.    ALLERGIES: Allergies  Allergen Reactions   Captopril Cough   Quinolones Other (See Comments)    AAA patient- avoid if possible    REVIEW OF SYSTEMS: 13 point ROS is otherwise negative.  PHYSICAL EXAM: BP 122/71 (BP Location: Right upper arm, Patient Position: Sitting, BP Cuff Size: Adult)   Pulse 70   Temp 36.4 C (97.5 F) (Temporal)   Resp 17   Ht 167.6 cm (5' 6)   Wt 70.7 kg (155 lb 12.8 oz)   BMI 25.15 kg/m   General Appearance:    Alert, cooperative, no distress, appears stated age  Head:    Normocephalic, without obvious abnormality, atraumatic  Neck:    Symmetric, trachea midline, no obvious masses  Lungs:     Respirations unlabored, no use of accessory muscles  Abdomen:     Soft, non-tender, non-distended.  Genitalia:    Not performed.  Extremities:   Extremities normal, atraumatic, no cyanosis or edema  Skin:   Skin color, texture, turgor normal, no rashes or lesions  Musculoskeletal:     Steady gait, ambulates unassisted  Neurologic:    Normal strength, sensation throughout    LABS: I personally reviewed and reviewed with the patient the results from  Lab Results  Component Value Date   CREATININE 0.8 04/03/2024    IMAGING:   I personally reviewed and reviewed with the patient the results from barium enema performed on 03/20/24. No evidence of colovesical fistula.    ASSESSMENT: 89 y.o. male with recurrent UTI, now on methenamine  per ID recommendation at last hospitalization.    No UTI or symptoms since last discharge.  Plan:  1. Continue methenamine  as prescribed 2. RTC 6 months  I spent a total of 32 minutes in both face-to-face and non-face-to-face activities, excluding procedures performed, for this visit on the date of this encounter.

## 2024-05-05 NOTE — Progress Notes (Addendum)
 " Chief Complaint  Patient presents with   Medication Refill    Would like to be prescribed something for UTI prevention (had 5 UTIs in past 2 month)     Subjective  Bryan Howard is a 89 y.o. male who presents for Medication Refill (Would like to be prescribed something for UTI prevention (had 5 UTIs in past 2 month) ) HPI History of Present Illness Bryan Howard is an 89 year old male who presents for medication management of recurrent urinary tract infections. He is accompanied by son who assists with his care.  Recurrent urinary tract infections - Five urinary tract infections over the past two months - Each infection required at least a five-day hospitalization - No urinary tract infections in the past four weeks since starting current medication regimen - needs refill of methenamine .  No current symptoms.           Review of Systems    Outpatient Medications Prior to Visit  Medication Sig Dispense Refill   acetaminophen  (TYLENOL ) 650 MG ER tablet Take one tablet every morning and two tablets nightly     alpha lipoic acid 200 mg Cap Take 600 mg by mouth at bedtime     ascorbic acid, vitamin C, (VITAMIN C) 500 MG tablet Take 500 mg by mouth 2 (two) times daily     atorvastatin  (LIPITOR) 80 MG tablet Take 1 tablet (80 mg total) by mouth once daily (Patient taking differently: Take 80 mg by mouth at bedtime) 100 tablet 3   celecoxib (CELEBREX) 200 MG capsule Take 1 capsule (200 mg total) by mouth 2 (two) times daily 60 capsule 5   cholecalciferol  (CHOLECALCIFEROL ) 1000 unit tablet Take 1,000 Units by mouth once daily     coenzyme Q10-vitamin E 100-5 mg-unit Cap Take 200 mg by mouth     cyanocobalamin  (VITAMIN B12) 1000 MCG tablet Take 1 tablet (1,000 mcg total) by mouth once daily 90 tablet 3   DULoxetine  (CYMBALTA ) 60 MG DR capsule Take 1 capsule (60 mg total) by mouth once daily 90 capsule 3   folic acid  (FOLVITE ) 800 MCG tablet Take 1 tablet by mouth at bedtime      gabapentin  (NEURONTIN ) 300 MG capsule Take 1 capsule (300 mg total) by mouth at bedtime 100 capsule 2   HYDROcodone -acetaminophen  (NORCO) 7.5-325 mg tablet Take 1 tablet by mouth once daily     levothyroxine  (SYNTHROID ) 100 MCG tablet Take 1 tablet (100 mcg total) by mouth once daily Take on an empty stomach with a glass of water at least 30-60 minutes before breakfast. 90 tablet 3   methenamine  mandelate (MANDELAMINE) 1 GM tablet Take 1 tablet (1 g total) by mouth 4 (four) times daily     miconazole 2 % cream Apply topically once daily To scrotum     omeprazole (PRILOSEC) 20 MG DR capsule Take 1 capsule (20 mg total) by mouth 2 (two) times daily before meals     phenazopyridine (PYRIDIUM) 100 MG tablet Take 100 mg by mouth 3 (three) times daily as needed     polyethylene glycol (MIRALAX ) packet Take 1 packet (17 g total) by mouth 2 (two) times daily Mix in 4-8ounces of fluid prior to taking.     naloxone (NARCAN) 4 mg/actuation nasal spray Place 1 spray (4 mg total) into one nostril once as needed for up to 1 dose 1 each 0   No facility-administered medications prior to visit.      Objective  Vitals:  05/05/24 1345  BP: 136/76  Pulse: 80  Resp: 18  Temp: 36.8 C (98.2 F)  TempSrc: Oral  SpO2: 98%  PainSc: 0-No pain   There is no height or weight on file to calculate BMI.  Home Vitals:     Physical Exam Physical Exam   Constitutional: alert, in NAD, and communicates well Eye exam: sclera anicteric.  Results      Assessment/Plan:   Assessment & Plan Recurrent urinary tract infection - Continue methenamine  mandelate four times a day. ([facility] doesn't have this, will switch to methenamine  hippurate due to this)     This visit was coded based on medical decision making (MDM).  There are no Patient Instructions on file for this visit.  An after visit summary was provided for the patient either in written format (printed) or through [facility].  This note has been created using automated tools and reviewed for accuracy by ELEFTHERIA THEOFANIS NAKOS.  "

## 2024-05-10 NOTE — Progress Notes (Signed)
 " Chief Complaint  Patient presents with   Referral Request    [facility]ants [facility].     Inflammation    Wants steroid shot for inflammation in his entire body    Subjective  Bryan Howard is a 89 y.o. male who presents for Referral Request ([facility]ants [facility].  ) and Inflammation (Wants steroid shot for inflammation in his entire body) HPI History of Present Illness Bryan Howard is an 89 year old male with recurrent urinary tract infections and recent sepsis who presents for follow-up after hospitalization and rehabilitation.  He was hospitalized in mid-December for a urinary tract infection that progressed to sepsis. He received treatment in the hospital and was then transferred to [facility] for about a month. He returned home around January 17th or 18th and is currently on methenamine  hippurate to prevent urinary infections. He has experienced multiple urinary tract infections in the past, of unknown reason.  He has issues with memory, including difficulty recalling the date, month, and year, and requires assistance with daily activities such as feeding. He does not initiate feeding himself. His son manages his affairs.  The caretaker who brought the patient to the clinic reports he did not recognize her.  She said she found him 3 days ago on the floor underneath his bed, it appears that he he may had slipped off from bed and falling to the floor.  He has been experiencing chronic pain and has previously declined steroids due to concerns about long-term effects.  The son is requesting for him to get started on steroids.  Also to note he is both urinary and fecal incontinent.          Review of Systems  Patient Active Problem List  Diagnosis   Hyperlipidemia   Family hx of prostate cancer   GERD (gastroesophageal reflux disease)   Barrett's esophagus   OSA (obstructive sleep apnea)   Chronic lower back pain   Osteoarthritis   Ureterolithiasis, left    Hypothyroidism   ED (erectile dysfunction)   Hx of transient ischemic attack (TIA) 12/12   Total knee replacement status   Cervical spinal stenosis   Hx of abdominal aortic aneurysm   Transfusion of blood product refused for religious reason   Advance directive on file   Obesity   Bilateral carpal tunnel syndrome   History of colonic polyps   Coronary atherosclerosis   Diverticulosis of colon   External hemorrhoids   Diaphragmatic hernia   Anemia of chronic disease   Choledocholithiasis   Greater trochanteric bursitis of left hip   Greater trochanteric bursitis of right hip   Leg weakness   Dizziness and giddiness   Sensorineural hearing loss, asymmetrical   Bilateral cervical radiculitis   Osteoarthritis of both glenohumeral joints   Essential hypertension   Right inguinal hernia   Degenerative disc disease, lumbar s/p prior fusions, laminectomy   Disequilibrium   Hiatal hernia   Esophageal stricture   Personal history of unspecified circulatory disease   Hx of bacterial meningitis- staphylococal   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   Epididymo-orchitis   Lethargy   Chronic obstructive pulmonary disease, unspecified COPD type (CMS/HHS-HCC)   Congestive heart failure, unspecified HF chronicity, unspecified heart failure type (CMS/HHS-HCC)   Current moderate episode of major depressive disorder, unspecified whether recurrent (CMS-HCC)   Fall   UTI (urinary tract infection)   Personal history of other malignant neoplasm of skin   History of pulmonary embolism  Infrarenal abdominal aortic aneurysm (AAA) without rupture ()   constipation   Acute cystitis without hematuria   Mild cognitive impairment   Moderate aortic stenosis   Hx of pulmonary embolus 2024   Chronic heart failure with preserved ejection fraction (HFpEF) (CMS/HHS-HCC)   Hypoxia   Prostate cancer (CMS/HHS-HCC)   Septic shock (CMS/HHS-HCC)    Malnutrition of moderate degree (HHS-HCC)   History of spinal surgery   History of orchiectomy   Acute pulmonary embolism (CMS/HHS-HCC)    Outpatient Medications Prior to Visit  Medication Sig Dispense Refill   acetaminophen  (TYLENOL ) 650 MG ER tablet Take one tablet every morning and two tablets nightly     alpha lipoic acid 200 mg Cap Take 600 mg by mouth at bedtime     ascorbic acid, vitamin C, (VITAMIN C) 500 MG tablet Take 500 mg by mouth 2 (two) times daily     atorvastatin  (LIPITOR) 80 MG tablet Take 1 tablet (80 mg total) by mouth once daily (Patient taking differently: Take 80 mg by mouth at bedtime) 100 tablet 3   celecoxib (CELEBREX) 200 MG capsule Take 1 capsule (200 mg total) by mouth 2 (two) times daily 60 capsule 5   cholecalciferol  (CHOLECALCIFEROL ) 1000 unit tablet Take 1,000 Units by mouth once daily     coenzyme Q10-vitamin E 100-5 mg-unit Cap Take 200 mg by mouth     cyanocobalamin  (VITAMIN B12) 1000 MCG tablet Take 1 tablet (1,000 mcg total) by mouth once daily 90 tablet 3   DULoxetine  (CYMBALTA ) 60 MG DR capsule Take 1 capsule (60 mg total) by mouth once daily 90 capsule 3   folic acid  (FOLVITE ) 800 MCG tablet Take 1 tablet by mouth at bedtime     gabapentin  (NEURONTIN ) 300 MG capsule Take 1 capsule (300 mg total) by mouth at bedtime 100 capsule 2   HYDROcodone -acetaminophen  (NORCO) 7.5-325 mg tablet Take 1 tablet by mouth once daily     levothyroxine  (SYNTHROID ) 100 MCG tablet Take 1 tablet (100 mcg total) by mouth once daily Take on an empty stomach with a glass of water at least 30-60 minutes before breakfast. 90 tablet 3   methenamine  hippurate (HIPREX) 1 gram tablet Take 1 tablet (1 g total) by mouth 2 (two) times daily 60 tablet 0   miconazole 2 % cream Apply topically once daily To scrotum     omeprazole (PRILOSEC) 20 MG DR capsule Take 1 capsule (20 mg total) by mouth 2 (two) times daily before meals     phenazopyridine (PYRIDIUM) 100 MG  tablet Take 100 mg by mouth 3 (three) times daily as needed     polyethylene glycol (MIRALAX ) packet Take 1 packet (17 g total) by mouth 2 (two) times daily Mix in 4-8ounces of fluid prior to taking.     naloxone (NARCAN) 4 mg/actuation nasal spray Place 1 spray (4 mg total) into one nostril once as needed for up to 1 dose 1 each 0   No facility-administered medications prior to visit.      Objective  Vitals:   05/10/24 1221  BP: 104/69  Pulse: 63  SpO2: 91%  Weight: 70.3 kg (155 lb)  PainSc:   8  PainLoc: Generalized   Body mass index is 25.02 kg/m.  Home Vitals:     Physical Exam Physical Exam    Constitutional: alert, in NAD, somnolent, and unable to fully report his medical condition displaying memory lapses Eye exam: pupils equal and reactive, extraocular eye movements intact. Neck: supple, no  thyroid enlargement or cervical adenopathy, and no bruits heard Respiratory: clear to auscultation, without rales or wheezes  Cardiovascular: regular rate and rhythm and without murmurs, rubs or gallops Lower extremities: lower extremity edema minimal Neurological: Wheelchair dependent  Results Labs Urine Cx (03/2024): Positive for urinary tract infection Blood Cx (03/2024): Positive for sepsis     Assessment/Plan:   Assessment & Plan Urosepsis Recent hospitalization for sepsis secondary to a urinary tract infection. Discharged to rehab and returned home on January 17th, 2026 after he left AMA. Memory issues noted, possibly related to recent hospitalization and sepsis. Concerns about decision-making capacity due to memory issues and recent AMA discharge from rehab. - Coordinated with palliative care for evaluation of decision-making capacity and pain management. - Resumed [facility] services for wellness coordination.  Recurrent urinary tract infection Recurrent UTIs with recent episode leading to sepsis. Currently on methylamine hippurate medication to prevent  further infections. - Continue prophylactic medication for UTI prevention.  Moderate vascular dementia Memory issues, including disorientation to time and place. Recent hospitalization and sepsis may have exacerbated cognitive decline. Concerns about decision-making capacity due to memory issues. - Coordinated with palliative care for evaluation of cognitive function and decision-making capacity.  Urinary and fecal incontinence Full incontinence noted, both urinary and fecal.  He is taking care of by several home health aides.  Chronic pain The patient's son who was not in the room but able to communicate through the telephone is requesting the patient to be placed on steroids because of his chronic pain.  I discussed with the son that they may be best since the patient already has palliative care to get the [facility] care specialist to manage his chronic pain.  This can be also accomplished through the patient's PCP.  Left AGAINST MEDICAL ADVICE The son reports his father after the hospitalization was placed on rehab center.  Approximately 4 days ago he was told that he left AGAINST MEDICAL ADVICE.  The son is uncertain how could this be because of the patient's dementia and uncertainty of capable of making medical decisions. He wanted letter to state this.  I told the patient's son may be best if palliative care is able to do full evaluation of his mental capacity and decision making so they can generate a document in case if the son needs it.  Diagnoses and all orders for this visit:  Urosepsis follow-up -     Ambulatory Referral to Home Health  Recurrent UTI (urinary tract infection)  Moderate vascular dementia without behavioral disturbance, psychotic disturbance, mood disturbance, or anxiety (CMS-HCC) -     Ambulatory Referral to Home Health  Urinary and fecal incontinence  Other chronic pain  Chronic diastolic (congestive) heart failure (CMS/HHS-HCC) Symptoms are stable.   Followed by a specialist.  Chronic obstructive pulmonary disease, unspecified COPD type (CMS/HHS-HCC) Symptoms are stable.  Followed by a specialist.  Current moderate episode of major depressive disorder, unspecified whether recurrent (CMS-HCC) Symptoms are stable.  Followed by a specialist.     I spent a total of in both face-to-face and non-face-to-face activities, excluding procedures performed, for this visit on the date of this encounter.            Future Appointments     Date/Time Provider Department Center Visit Type   06/07/2024 10:30 AM (Arrive by 10:15 AM) [facility]   07/24/2024 2:00 PM Barefoot, Mliss, LAC [facility]  60   08/17/2024 11:15 AM (Arrive by 11:00 AM) Jarrell Courtland Jansky, MD [facility] 2nd Floor [address] DERM RET PATIENT   12/11/2024 12:00 PM (Arrive by 11:45 AM) Coppolino, Lauraine Lacks, PA [facility] INTERNAL REFERRAL   12/20/2024 12:30 PM (Arrive by 12:00 PM) Scales, Carlin Vicenta Raddle., MD [facility] RETURN VISIT       There are no Patient Instructions on file for this visit.  An after visit summary was provided for the patient either in written format (printed) or through [facility].  This note has been created using automated tools and reviewed for accuracy by MARIO E OLMEDO.  "

## 2024-05-13 ENCOUNTER — Emergency Department

## 2024-05-13 ENCOUNTER — Inpatient Hospital Stay
Admission: EM | Admit: 2024-05-13 | Discharge: 2024-05-18 | DRG: 689 | Disposition: A | Discharge status: Skilled Nursing Facility | Attending: Student | Admitting: Student

## 2024-05-13 ENCOUNTER — Other Ambulatory Visit: Payer: Self-pay

## 2024-05-13 DIAGNOSIS — Z7401 Bed confinement status: Secondary | ICD-10-CM

## 2024-05-13 DIAGNOSIS — D509 Iron deficiency anemia, unspecified: Secondary | ICD-10-CM | POA: Diagnosis present

## 2024-05-13 DIAGNOSIS — I10 Essential (primary) hypertension: Secondary | ICD-10-CM | POA: Diagnosis present

## 2024-05-13 DIAGNOSIS — Z66 Do not resuscitate: Secondary | ICD-10-CM | POA: Diagnosis present

## 2024-05-13 DIAGNOSIS — G4700 Insomnia, unspecified: Secondary | ICD-10-CM | POA: Diagnosis present

## 2024-05-13 DIAGNOSIS — Z96653 Presence of artificial knee joint, bilateral: Secondary | ICD-10-CM | POA: Diagnosis present

## 2024-05-13 DIAGNOSIS — K219 Gastro-esophageal reflux disease without esophagitis: Secondary | ICD-10-CM | POA: Diagnosis present

## 2024-05-13 DIAGNOSIS — D696 Thrombocytopenia, unspecified: Secondary | ICD-10-CM | POA: Diagnosis present

## 2024-05-13 DIAGNOSIS — G9340 Encephalopathy, unspecified: Secondary | ICD-10-CM | POA: Diagnosis not present

## 2024-05-13 DIAGNOSIS — L89152 Pressure ulcer of sacral region, stage 2: Secondary | ICD-10-CM | POA: Diagnosis present

## 2024-05-13 DIAGNOSIS — E639 Nutritional deficiency, unspecified: Secondary | ICD-10-CM | POA: Diagnosis present

## 2024-05-13 DIAGNOSIS — Z888 Allergy status to other drugs, medicaments and biological substances status: Secondary | ICD-10-CM

## 2024-05-13 DIAGNOSIS — F329 Major depressive disorder, single episode, unspecified: Secondary | ICD-10-CM | POA: Diagnosis present

## 2024-05-13 DIAGNOSIS — G8929 Other chronic pain: Secondary | ICD-10-CM | POA: Diagnosis present

## 2024-05-13 DIAGNOSIS — F0393 Unspecified dementia, unspecified severity, with mood disturbance: Secondary | ICD-10-CM | POA: Diagnosis present

## 2024-05-13 DIAGNOSIS — Z79899 Other long term (current) drug therapy: Secondary | ICD-10-CM

## 2024-05-13 DIAGNOSIS — Z95828 Presence of other vascular implants and grafts: Secondary | ICD-10-CM

## 2024-05-13 DIAGNOSIS — I5032 Chronic diastolic (congestive) heart failure: Secondary | ICD-10-CM | POA: Diagnosis present

## 2024-05-13 DIAGNOSIS — F0394 Unspecified dementia, unspecified severity, with anxiety: Secondary | ICD-10-CM | POA: Diagnosis present

## 2024-05-13 DIAGNOSIS — N200 Calculus of kidney: Secondary | ICD-10-CM | POA: Diagnosis present

## 2024-05-13 DIAGNOSIS — Z8546 Personal history of malignant neoplasm of prostate: Secondary | ICD-10-CM

## 2024-05-13 DIAGNOSIS — Z9842 Cataract extraction status, left eye: Secondary | ICD-10-CM

## 2024-05-13 DIAGNOSIS — Z800 Family history of malignant neoplasm of digestive organs: Secondary | ICD-10-CM

## 2024-05-13 DIAGNOSIS — G9341 Metabolic encephalopathy: Secondary | ICD-10-CM | POA: Diagnosis present

## 2024-05-13 DIAGNOSIS — L8990 Pressure ulcer of unspecified site, unspecified stage: Secondary | ICD-10-CM | POA: Insufficient documentation

## 2024-05-13 DIAGNOSIS — Z87440 Personal history of urinary (tract) infections: Secondary | ICD-10-CM

## 2024-05-13 DIAGNOSIS — G4733 Obstructive sleep apnea (adult) (pediatric): Secondary | ICD-10-CM | POA: Diagnosis present

## 2024-05-13 DIAGNOSIS — F03918 Unspecified dementia, unspecified severity, with other behavioral disturbance: Secondary | ICD-10-CM | POA: Diagnosis present

## 2024-05-13 DIAGNOSIS — R159 Full incontinence of feces: Secondary | ICD-10-CM | POA: Diagnosis present

## 2024-05-13 DIAGNOSIS — Z87891 Personal history of nicotine dependence: Secondary | ICD-10-CM

## 2024-05-13 DIAGNOSIS — Z8673 Personal history of transient ischemic attack (TIA), and cerebral infarction without residual deficits: Secondary | ICD-10-CM

## 2024-05-13 DIAGNOSIS — N3090 Cystitis, unspecified without hematuria: Principal | ICD-10-CM | POA: Diagnosis present

## 2024-05-13 DIAGNOSIS — I110 Hypertensive heart disease with heart failure: Secondary | ICD-10-CM | POA: Diagnosis present

## 2024-05-13 DIAGNOSIS — Z8679 Personal history of other diseases of the circulatory system: Secondary | ICD-10-CM

## 2024-05-13 DIAGNOSIS — N390 Urinary tract infection, site not specified: Secondary | ICD-10-CM

## 2024-05-13 DIAGNOSIS — J449 Chronic obstructive pulmonary disease, unspecified: Secondary | ICD-10-CM | POA: Diagnosis present

## 2024-05-13 DIAGNOSIS — I2510 Atherosclerotic heart disease of native coronary artery without angina pectoris: Secondary | ICD-10-CM | POA: Diagnosis present

## 2024-05-13 DIAGNOSIS — E785 Hyperlipidemia, unspecified: Secondary | ICD-10-CM | POA: Diagnosis present

## 2024-05-13 DIAGNOSIS — F411 Generalized anxiety disorder: Secondary | ICD-10-CM | POA: Diagnosis present

## 2024-05-13 DIAGNOSIS — J4489 Other specified chronic obstructive pulmonary disease: Secondary | ICD-10-CM | POA: Diagnosis present

## 2024-05-13 DIAGNOSIS — Z1152 Encounter for screening for COVID-19: Secondary | ICD-10-CM

## 2024-05-13 DIAGNOSIS — Z79890 Hormone replacement therapy (postmenopausal): Secondary | ICD-10-CM

## 2024-05-13 DIAGNOSIS — Z9841 Cataract extraction status, right eye: Secondary | ICD-10-CM

## 2024-05-13 DIAGNOSIS — E039 Hypothyroidism, unspecified: Secondary | ICD-10-CM | POA: Diagnosis present

## 2024-05-13 DIAGNOSIS — Z86711 Personal history of pulmonary embolism: Secondary | ICD-10-CM

## 2024-05-13 DIAGNOSIS — R531 Weakness: Secondary | ICD-10-CM | POA: Diagnosis present

## 2024-05-13 DIAGNOSIS — E860 Dehydration: Secondary | ICD-10-CM | POA: Diagnosis present

## 2024-05-13 LAB — URINALYSIS, ROUTINE W REFLEX MICROSCOPIC
Bilirubin Urine: NEGATIVE
Glucose, UA: NEGATIVE mg/dL
Ketones, ur: NEGATIVE mg/dL
Nitrite: NEGATIVE
Protein, ur: 30 mg/dL — AB
RBC / HPF: >50 RBC/hpf (ref 0–5)
Specific Gravity, Urine: >1.046 — ABNORMAL HIGH (ref 1.005–1.030)
WBC, UA: >50 WBC/hpf (ref 0–5)
pH: 6 (ref 5.0–8.0)

## 2024-05-13 LAB — COMPREHENSIVE METABOLIC PANEL WITH GFR
ALT: 17 U/L (ref 0–44)
AST: 25 U/L (ref 15–41)
Albumin: 3.7 g/dL (ref 3.5–5.0)
Alkaline Phosphatase: 93 U/L (ref 38–126)
Anion gap: 11 (ref 5–15)
BUN: 25 mg/dL — ABNORMAL HIGH (ref 8–23)
CO2: 27 mmol/L (ref 22–32)
Calcium: 10.5 mg/dL — ABNORMAL HIGH (ref 8.9–10.3)
Chloride: 101 mmol/L (ref 98–111)
Creatinine, Ser: 0.83 mg/dL (ref 0.61–1.24)
GFR, Estimated: >60 mL/min
Glucose, Bld: 115 mg/dL — ABNORMAL HIGH (ref 70–99)
Potassium: 4.1 mmol/L (ref 3.5–5.1)
Sodium: 138 mmol/L (ref 135–145)
Total Bilirubin: 0.8 mg/dL (ref 0.0–1.2)
Total Protein: 7.5 g/dL (ref 6.5–8.1)

## 2024-05-13 LAB — CBC WITH DIFFERENTIAL/PLATELET
Abs Immature Granulocytes: 0.13 10*3/uL — ABNORMAL HIGH (ref 0.00–0.07)
Basophils Absolute: 0 10*3/uL (ref 0.0–0.1)
Basophils Relative: 0 %
Eosinophils Absolute: 0 10*3/uL (ref 0.0–0.5)
Eosinophils Relative: 0 %
HCT: 32.4 % — ABNORMAL LOW (ref 39.0–52.0)
Hemoglobin: 10.5 g/dL — ABNORMAL LOW (ref 13.0–17.0)
Immature Granulocytes: 1 %
Lymphocytes Relative: 5 %
Lymphs Abs: 0.7 10*3/uL (ref 0.7–4.0)
MCH: 33.4 pg (ref 26.0–34.0)
MCHC: 32.4 g/dL (ref 30.0–36.0)
MCV: 103.2 fL — ABNORMAL HIGH (ref 80.0–100.0)
Monocytes Absolute: 1.1 10*3/uL — ABNORMAL HIGH (ref 0.1–1.0)
Monocytes Relative: 9 %
Neutro Abs: 11.3 10*3/uL — ABNORMAL HIGH (ref 1.7–7.7)
Neutrophils Relative %: 85 %
Platelets: 164 10*3/uL (ref 150–400)
RBC: 3.14 MIL/uL — ABNORMAL LOW (ref 4.22–5.81)
RDW: 15.4 % (ref 11.5–15.5)
WBC: 13.3 10*3/uL — ABNORMAL HIGH (ref 4.0–10.5)
nRBC: 0 % (ref 0.0–0.2)

## 2024-05-13 LAB — RESP PANEL BY RT-PCR (RSV, FLU A&B, COVID)  RVPGX2
Influenza A by PCR: NEGATIVE
Influenza B by PCR: NEGATIVE
Resp Syncytial Virus by PCR: NEGATIVE
SARS Coronavirus 2 by RT PCR: NEGATIVE

## 2024-05-13 LAB — TYPE AND SCREEN
ABO/RH(D): A POS
Antibody Screen: NEGATIVE

## 2024-05-13 LAB — AMMONIA: Ammonia: <13 umol/L (ref 9–35)

## 2024-05-13 LAB — TROPONIN T, HIGH SENSITIVITY
Troponin T High Sensitivity: 55 ng/L — ABNORMAL HIGH (ref 0–19)
Troponin T High Sensitivity: 54 ng/L — ABNORMAL HIGH (ref 0–19)

## 2024-05-13 LAB — PROCALCITONIN: Procalcitonin: 0.18 ng/mL

## 2024-05-13 LAB — TSH: TSH: 0.795 u[IU]/mL (ref 0.350–4.500)

## 2024-05-13 LAB — OSMOLALITY: Osmolality: 298 mosm/kg — ABNORMAL HIGH (ref 275–295)

## 2024-05-13 LAB — MAGNESIUM: Magnesium: 1.8 mg/dL (ref 1.7–2.4)

## 2024-05-13 LAB — PRO BRAIN NATRIURETIC PEPTIDE: Pro Brain Natriuretic Peptide: 1704 pg/mL — ABNORMAL HIGH

## 2024-05-13 LAB — LACTIC ACID, PLASMA: Lactic Acid, Venous: 0.9 mmol/L (ref 0.5–1.9)

## 2024-05-13 LAB — LIPASE, BLOOD: Lipase: 12 U/L (ref 11–51)

## 2024-05-13 MED ORDER — ACETAMINOPHEN 325 MG PO TABS: 650.0000 mg | ORAL_TABLET | Freq: Four times a day (QID) | ORAL | Status: DC | PRN

## 2024-05-13 MED ORDER — DULOXETINE HCL 30 MG PO CPEP
60.0000 mg | ORAL_CAPSULE | Freq: Every day | ORAL | Status: DC
  Administered 2024-05-14 – 2024-05-18 (×4): 60 mg via ORAL
  Filled 2024-05-13 (×5): qty 2

## 2024-05-13 MED ORDER — METHENAMINE MANDELATE 0.5 G PO TABS
1.0000 g | ORAL_TABLET | Freq: Two times a day (BID) | ORAL | Status: DC
  Administered 2024-05-14 – 2024-05-18 (×9): 1 g via ORAL
  Filled 2024-05-13 (×10): qty 2

## 2024-05-13 MED ORDER — ACETAMINOPHEN 650 MG RE SUPP: 650.0000 mg | Freq: Four times a day (QID) | RECTAL | Status: DC | PRN

## 2024-05-13 MED ORDER — MAGNESIUM SULFATE 2 GM/50ML IV SOLN
2.0000 g | Freq: Once | INTRAVENOUS | Status: AC
  Administered 2024-05-13: 2 g via INTRAVENOUS
  Filled 2024-05-13: qty 50

## 2024-05-13 MED ORDER — SODIUM CHLORIDE 0.9 % IV SOLN
1.0000 g | Freq: Once | INTRAVENOUS | Status: AC
  Administered 2024-05-13: 1 g via INTRAVENOUS
  Filled 2024-05-13: qty 10

## 2024-05-13 MED ORDER — SODIUM CHLORIDE 0.9% FLUSH
3.0000 mL | Freq: Two times a day (BID) | INTRAVENOUS | Status: DC
  Administered 2024-05-13 – 2024-05-18 (×10): 3 mL via INTRAVENOUS

## 2024-05-13 MED ORDER — IOHEXOL 300 MG/ML  SOLN
100.0000 mL | Freq: Once | INTRAMUSCULAR | Status: AC | PRN
  Administered 2024-05-13: 100 mL via INTRAVENOUS

## 2024-05-13 MED ORDER — ONDANSETRON HCL 4 MG PO TABS: 4.0000 mg | ORAL_TABLET | Freq: Four times a day (QID) | ORAL | Status: DC | PRN

## 2024-05-13 MED ORDER — OLANZAPINE 10 MG IM SOLR: 2.5000 mg | Freq: Once | INTRAMUSCULAR | Status: DC | PRN

## 2024-05-13 MED ORDER — LACTATED RINGERS IV SOLN: INTRAVENOUS | Status: AC

## 2024-05-13 MED ORDER — HEPARIN SODIUM (PORCINE) 5000 UNIT/ML IJ SOLN
5000.0000 [IU] | Freq: Three times a day (TID) | INTRAMUSCULAR | Status: DC
  Administered 2024-05-13 – 2024-05-18 (×16): 5000 [IU] via SUBCUTANEOUS
  Filled 2024-05-13 (×16): qty 1

## 2024-05-13 MED ORDER — SENNOSIDES-DOCUSATE SODIUM 8.6-50 MG PO TABS: 1.0000 | ORAL_TABLET | Freq: Every evening | ORAL | Status: DC | PRN

## 2024-05-13 MED ORDER — VITAMIN D3 25 MCG (1000 UNIT) PO TABS
1000.0000 [IU] | ORAL_TABLET | Freq: Every day | ORAL | Status: DC
  Administered 2024-05-14 – 2024-05-18 (×4): 1000 [IU] via ORAL
  Filled 2024-05-13 (×10): qty 1

## 2024-05-13 MED ORDER — ATORVASTATIN CALCIUM 20 MG PO TABS
80.0000 mg | ORAL_TABLET | Freq: Every day | ORAL | Status: DC
  Administered 2024-05-14 – 2024-05-18 (×4): 80 mg via ORAL
  Filled 2024-05-13 (×5): qty 4

## 2024-05-13 MED ORDER — ONDANSETRON HCL 4 MG/2ML IJ SOLN: 4.0000 mg | Freq: Four times a day (QID) | INTRAMUSCULAR | Status: DC | PRN

## 2024-05-13 MED ORDER — LEVOTHYROXINE SODIUM 100 MCG PO TABS
100.0000 ug | ORAL_TABLET | Freq: Every day | ORAL | Status: DC
  Administered 2024-05-14 – 2024-05-18 (×5): 100 ug via ORAL
  Filled 2024-05-13 (×5): qty 1

## 2024-05-13 NOTE — ED Provider Notes (Signed)
 "  [facility] Provider Note    Event Date/Time   First MD Initiated Contact with Patient 05/13/24 [phone]     (approximate)   History   Altered Mental Status   HPI  Bryan Howard. is a 89 y.o. male  male w/ hx of obesity (BMI 30), HTN, HL, OSA (not on CPAP), HFpEF, moderate AS, prior PE (2024, no longer on [facility]), 1st-degree heart block, non-obstructive CAD, TIA, AAA s/p endovascular repair (2019), hypothyroidism, prior diverticulitis, chronic pain (on maintenance opiate therapy), prostate cancer s/p cryoablation, and recurrent UTIs with multiple recent admission for recurrent UTI who presents to the emergency department with 24 hours of increased confusion and altered mental status.  History is obtained by EMS and calling the patient's son who provides a history.  Patient was recently released from a skilled nursing facility approximately 1 week ago.  He is normally alert and oriented x 3 with mild confusion.  However for the past 24 hours patient has become increasingly confused and altered similar to what he experiences when he has a UTI.  He has also experienced a cough for the past 3 days as well.  Patient does not answer questions correctly.     Physical Exam   Triage Vital Signs: ED Triage Vitals  Encounter Vitals Group     BP      Girls Systolic BP Percentile      Girls Diastolic BP Percentile      Boys Systolic BP Percentile      Boys Diastolic BP Percentile      Pulse      Resp      Temp      Temp src      SpO2      Weight      Height      Head Circumference      Peak Flow      Pain Score      Pain Loc      Pain Education      Exclude from Growth Chart     Most recent vital signs: Vitals:   05/13/24 1500 05/13/24 1800  BP: (!) 114/92 106/88  Pulse: 100 100  Resp: (!) 23 16  Temp: 98.4 F (36.9 C) 98.4 F (36.9 C)  SpO2: 94% 95%    Nursing Triage Note reviewed. Vital signs reviewed and patients oxygen saturation is  normoxic  General: Patient is well nourished, well developed, awake and alert, appears chronically ill Head: Normocephalic and atraumatic Eyes: Normal inspection, extraocular muscles intact, no conjunctival pallor Ear, nose, throat: Normal external exam Neck: Normal range of motion Respiratory: Patient is in no respiratory distress, lungs possible mild bronchospasm Cardiovascular: Patient is tachycardic, RR without murmur appreciated GI: Abd Soft, ttp generally with no guarding or rebound  Back: Normal inspection of the back with good strength and range of motion throughout all ext Extremities: pulses intact with good cap refills, no LE pitting edema or calf tenderness Neuro: The patient is alert and oriented to person, not to place or time very confused, moves all extremities sensation intact  skin: Pale   ED Results / Procedures / Treatments   Labs (all labs ordered are listed, but only abnormal results are displayed) Labs Reviewed  CBC WITH DIFFERENTIAL/PLATELET - Abnormal; Notable for the following components:      Result Value   WBC 13.3 (*)    RBC 3.14 (*)    Hemoglobin 10.5 (*)    HCT  32.4 (*)    MCV 103.2 (*)    Neutro Abs 11.3 (*)    Monocytes Absolute 1.1 (*)    Abs Immature Granulocytes 0.13 (*)    All other components within normal limits  COMPREHENSIVE METABOLIC PANEL WITH GFR - Abnormal; Notable for the following components:   Glucose, Bld 115 (*)    BUN 25 (*)    Calcium  10.5 (*)    All other components within normal limits  PRO BRAIN NATRIURETIC PEPTIDE - Abnormal; Notable for the following components:   Pro Brain Natriuretic Peptide 1,704.0 (*)    All other components within normal limits  URINALYSIS, ROUTINE W REFLEX MICROSCOPIC - Abnormal; Notable for the following components:   Color, Urine YELLOW (*)    APPearance HAZY (*)    Specific Gravity, Urine >1.046 (*)    Hgb urine dipstick LARGE (*)    Protein, ur 30 (*)    Leukocytes,Ua LARGE (*)     Bacteria, UA RARE (*)    All other components within normal limits  OSMOLALITY - Abnormal; Notable for the following components:   Osmolality 298 (*)    All other components within normal limits  TROPONIN T, HIGH SENSITIVITY - Abnormal; Notable for the following components:   Troponin T High Sensitivity 55 (*)    All other components within normal limits  TROPONIN T, HIGH SENSITIVITY - Abnormal; Notable for the following components:   Troponin T High Sensitivity 54 (*)    All other components within normal limits  RESP PANEL BY RT-PCR (RSV, FLU A&B, COVID)  RVPGX2  CULTURE, BLOOD (ROUTINE X 2)  CULTURE, BLOOD (ROUTINE X 2)  URINE CULTURE  LIPASE, BLOOD  LACTIC ACID, PLASMA  PROCALCITONIN  AMMONIA  MAGNESIUM   TSH  BASIC METABOLIC PANEL WITH GFR  CBC  TYPE AND SCREEN     EKG EKG and rhythm strip are interpreted by myself:   EKG: Tachycardic sinus rhythm] at heart rate of 97, normal QRS duration, QTc 455, nonspecific ST segments and T waves no ectopy EKG not consistent with Acute STEMI Rhythm strip: Tachycardic in lead II   RADIOLOGY CT head: No intracranial hemorrhage on my independent review interpretation radiologist agrees CT abdomen pelvis: No acute abnormality on my independent review interpretation radiologist agrees Chest x-ray: Consistent with viral bronchitis per radiologist    PROCEDURES:  Critical Care performed: No  Procedures   MEDICATIONS ORDERED IN ED: Medications  sodium chloride  flush (NS) 0.9 % injection 3 mL (3 mLs Intravenous Given 05/13/24 1307)  acetaminophen  (TYLENOL ) tablet 650 mg (has no administration in time range)    Or  acetaminophen  (TYLENOL ) suppository 650 mg (has no administration in time range)  senna-docusate (Senokot-S) tablet 1 tablet (has no administration in time range)  heparin  injection 5,000 Units (5,000 Units Subcutaneous Given 05/13/24 1453)  lactated ringers  infusion ( Intravenous Stopped 05/13/24 1847)  ondansetron   (ZOFRAN ) tablet 4 mg (has no administration in time range)    Or  ondansetron  (ZOFRAN ) injection 4 mg (has no administration in time range)  OLANZapine  (ZYPREXA ) injection 2.5 mg (has no administration in time range)  magnesium  sulfate IVPB 2 g 50 mL ( Intravenous Infusion Verify 05/13/24 1854)  iohexol  (OMNIPAQUE ) 300 MG/ML solution 100 mL (100 mLs Intravenous Contrast Given 05/13/24 1053)  ceFEPIme  (MAXIPIME ) 1 g in sodium chloride  0.9 % 100 mL IVPB (0 g Intravenous Stopped 05/13/24 1225)     IMPRESSION / MDM / ASSESSMENT AND PLAN / ED COURSE  Differential diagnosis includes, but is not limited to, intracranial hemorrhage, UTI, sepsis, elevated ammonia, pneumonia, CHF   ED course: Patient presents very confused but without any focal neurological deficits.  CT head demonstrated no evidence of a intracranial hemorrhage.  He did have a leukocytosis but no acute renal insufficiency.  He did have some abdominal tenderness to palpation so CT abdomen pelvis was ordered which demonstrated evidence of cystitis.  Given this I did decide to cover him with antibiotics and upon last urinary culture he was sensitive to cefepime  and this was administered.  Urinalysis was finally obtained but is currently pending.  Case was discussed with hospitalist for admission   Clinical Course as of 05/13/24 1910  [place] May 13, 2024  0954 Called son: Change in behavior yesterday afternoon, agitated overnight, unable to take his pills. Didn't know how to swallow. Normal able A&O 2-3, mildly confused. Was in rehab, released one week ago;  Bed bound at baseline    [HD]  1113 Troponin T High Sensitivity(!): 55 [HD]  1113 CT Head Wo Contrast No acute abnormality [HD]  1114 DG Chest 1 View Consistent with bronchitis [HD]  1131 Procalcitonin: 0.18 Not elevated [HD]  1131 CT ABDOMEN PELVIS W CONTRAST Possibly consistent with cystitis [HD]  1224 Case discussed with hospitalist for admission  [HD]    Clinical Course User Index [HD] Nicholaus Rolland BRAVO, MD   -- Risk: 5 This patient has a high risk of morbidity due to further diagnostic testing or treatment. Rationale: This patients evaluation and management involve a high risk of morbidity due to the potential severity of presenting symptoms, need for diagnostic testing, and/or initiation of treatment that may require close monitoring. The differential includes conditions with potential for significant deterioration or requiring escalation of care. Treatment decisions in the ED, including medication administration, procedural interventions, or disposition planning, reflect this level of risk. COPA: 5 The patient has the following acute or chronic illness/injury that poses a possible threat to life or bodily function: [X] : The patient has a potentially serious acute condition or an acute exacerbation of a chronic illness requiring urgent evaluation and management in the Emergency Department. The clinical presentation necessitates immediate consideration of life-threatening or function-threatening diagnoses, even if they are ultimately ruled out.   FINAL CLINICAL IMPRESSION(S) / ED DIAGNOSES   Final diagnoses:  Acute encephalopathy  Urinary tract infection without hematuria, site unspecified     Rx / DC Orders   ED Discharge Orders     None        Note:  This document was prepared using Dragon voice recognition software and may include unintentional dictation errors.   Nicholaus Rolland BRAVO, MD 05/13/24 1910  "

## 2024-05-13 NOTE — ED Triage Notes (Signed)
 BIBEMS from home. Pt lives w/ son, according to son pt has been altered for about 24 hrs. Pt also has a productive cough that started today per pt. Hx of UTIs.   EMS VS: 96 HR 126/68 BP 37 End-Tidal 147 CBG 98.6 F

## 2024-05-13 NOTE — Progress Notes (Signed)
 Patient admitted to the floor. Patient is A&O to self only. Full assessment completed but unable to complete admission portion. No family present.

## 2024-05-13 NOTE — H&P (Addendum)
 " History and Physical    Bryan Howard. FMW:[id number] DOB: 02-02-1933 DOA: 05/13/2024  DOS: the patient was seen and examined on 05/13/2024  PCP: Jyl Railing, MD   Patient coming from: Home  I have personally briefly reviewed patient's old medical records in [facility] and CareEverywhere  HPI:   Bryan Howard. is a 89 y.o. year old male with medical history of HTN, HLD, hypothyroidism, and recurrent urinary tract infections presenting to the ED via EMS after son noted him to be confused.  Son states patient is forgetful at baseline but does not have diagnosis of dementia.  He states he is able to carry out a conversation with slight forgetfulness.  He states patient has not been to his baseline since October 2025 which is when he started having recurrent urinary tract infections. Per son since yesterday he has been more confused and overnight was very agitated.  Typically he takes his medications but was unable to take his medications as he would not swallow them.  Patient has also had decreased intake including decreased water intake.  Pt denies any acute concerns. He is not able to contribute to history and states he is feeling well. He is unable to state the reason for his visit.  Patient was admitted on June/2025 for urinary tract infection.  He has had 6 admissions since then with this being fifth admission since October 2025.  Chart review shows pt having thorough work up urology including cystoscopy but no anatomic cause has been identified for his UTI. On arrival to the ED patient was noted to be HDS stable.  Lab work and imaging obtained.  BC with mild leukocytosis, baseline anemia.  CMP overall unremarkable aside mild hypercalcemia and slight BUN elevation.  Ammonia checked and normal.  Lipase checked and normal. Troponin mildly elevated.  proBNP mildly elevated at 1704.  Respiratory panel negative for COVID, flu, RSV.  Procalcitonin mildly elevated at 0.18.  Chest x-ray  without any acute findings.  CT head without any acute findings.  CT abdomen pelvis shows mild diffuse bladder wall thickening concerning for cystitis versus chronic bladder outlet obstruction.  He has bilateral nephrolithiasis but no ureteral calculi or hydronephrosis present.  UA and urine culture obtained.  Blood cultures ordered.  Given history of recurrent infections last data reviewed and patient started on cefepime .  Given need for further care, [facility] contacted for admission.  Review of Systems: As mentioned in the history of present illness. All other systems reviewed and are negative.   Past Medical History:  Diagnosis Date   Abdominal aortic aneurysm    Coronary artery disease    Diverticulosis of colon    Esophageal stricture    GERD (gastroesophageal reflux disease)    Hemorrhoids, external    Hiatal hernia    Hyperlipidemia    Hypothyroidism     Past Surgical History:  Procedure Laterality Date   ABDOMINAL AORTIC ANEURYSM REPAIR     APPENDECTOMY     EYE SURGERY     bilateral cataract surgery   INGUINAL HERNIA REPAIR     Right inguinal herniography   NECK SURGERY     fuse vertabrae    ROTATOR CUFF REPAIR     left   TOTAL KNEE ARTHROPLASTY     left    TOTAL KNEE ARTHROPLASTY     right     Allergies[1]  Family History  Problem Relation Age of Onset   Colon cancer Cousin  maternal 1st cousin   Heart disease Unknown     Prior to Admission medications  Medication Sig Start Date End Date Taking? Authorizing Provider  acetaminophen  (TYLENOL ) 650 MG CR tablet Take one tablet every morning and two tablets nightly 08/12/22  Yes [provider]  Alpha-Lipoic Acid 200 MG CAPS Take 200 mg by mouth daily.   Yes [provider]  ascorbic acid (VITAMIN C) 500 MG tablet Take 500 mg by mouth 2 (two) times daily.   Yes [provider]  atorvastatin  (LIPITOR) 80 MG tablet Take 80 mg by mouth daily. 03/15/22 07/10/24 Yes [provider]   celecoxib (CELEBREX) 200 MG capsule Take 200 mg by mouth 2 (two) times daily. 02/13/24  Yes [provider]  Cholecalciferol  25 MCG (1000 UT) tablet Take 1,000 Units by mouth daily.   Yes [provider]  cyanocobalamin  ([employer] VITAMIN B12) 1000 MCG tablet Take 1,000 mcg by mouth daily.    Yes [provider]  DULoxetine  (CYMBALTA ) 60 MG capsule Take 60 mg by mouth daily. 05/07/24  Yes [provider]  folic acid  (FOLVITE ) 800 MCG tablet Take 800 mcg by mouth at bedtime. 07/11/23  Yes [provider]  gabapentin  (NEURONTIN ) 300 MG capsule Take 300 mg by mouth at bedtime.   Yes [provider]  HYDROcodone -acetaminophen  (NORCO) 7.5-325 MG tablet Take 1 tablet by mouth 2 (two) times daily as needed for moderate pain (pain score 4-6) or severe pain (pain score 7-10).   Yes [provider]  levothyroxine  (SYNTHROID ) 112 MCG tablet Take 112 mcg by mouth daily before breakfast. 07/09/22 07/10/24 Yes [provider]  methenamine  (HIPREX) 1 g tablet Take 1 g by mouth 2 (two) times daily with a meal. 05/05/24 05/05/25 Yes [provider]  omeprazole (PRILOSEC) 20 MG capsule Take 20 mg by mouth 2 (two) times daily as needed.    Yes [provider]  polyethylene glycol (MIRALAX  / GLYCOLAX ) 17 g packet Take 17 g by mouth daily as needed for mild constipation or moderate constipation. 11/23/22  Yes [provider]  sennosides-docusate sodium  (SENOKOT-S) 8.6-50 MG tablet Take 1 tablet by mouth daily.   Yes [provider]  Coenzyme Q10 100 MG capsule Take 200 mg by mouth.    [provider]  DULoxetine  HCl 30 MG CSDR Take 30 mg by mouth daily. Patient not taking: Reported on 05/13/2024    [provider]  [Paused] furosemide (LASIX) 40 MG tablet Take 40 mg by mouth. Patient not taking: Reported on 02/28/2024 Wait to take this until your doctor or other care provider tells you to start again. 12/30/21    [provider]  [Paused] linezolid (ZYVOX) 600 MG tablet Take 600 mg by mouth 2 (two) times daily. Patient not taking: Reported on 02/28/2024 Wait to take this until your doctor or other care provider tells you to start again. 09/03/23   [provider]  [Paused] losartan (COZAAR) 50 MG tablet Take 50 mg by mouth daily. Patient not taking: Reported on 05/13/2024 Wait to take this until your doctor or other care provider tells you to start again.    [provider]  miconazole (MICOTIN) 2 % cream Apply 1 Application topically 2 (two) times daily. Patient not taking: Reported on 05/13/2024    [provider]  naloxone [facility]) nasal spray 4 mg/0.1 mL Place 1 spray into the nose once as needed. Patient not taking: Reported on 05/13/2024 09/29/23   [provider]  [  Paused] phenazopyridine (PYRIDIUM) 100 MG tablet Take 100 mg by mouth 3 (three) times daily as needed. Patient not taking: Reported on 02/28/2024 Wait to take this until your doctor or other care provider tells you to start again. 01/02/24   [provider]  [Paused] senna (SENOKOT) 8.6 MG tablet Take 1 tablet by mouth 2 (two) times daily. Wait to take this until your doctor or other care provider tells you to start again. 09/29/23 09/28/24  [provider]    Social History:  reports that he has quit smoking. He has never used smokeless tobacco. He reports that he does not drink alcohol and does not use drugs. Lives by himself with caretakers available except at nighttime.  Does not smoke or drink.  His ADLs and IADLs.   Physical Exam: Vitals:   05/13/24 0907 05/13/24 0932 05/13/24 1320 05/13/24 1500  BP:   (!) 130/91 (!) 114/92  Pulse:   100 100  Resp:   20 (!) 23  Temp:    98.4 F (36.9 C)  TempSrc:    Oral  SpO2:  100% 95% 94%  Weight: 68.7 kg       Gen: NAD HENT: NCAT CV: normal heart sounds Lung: CTAB Abd: No TTP, normal bowel sounds MSK: No asymmetry,  good bulk and tone Neuro: alert and oriented x1   Labs on Admission: I have personally reviewed following labs and imaging studies  CBC: Recent Labs  Lab 05/13/24 0913  WBC 13.3*  NEUTROABS 11.3*  HGB 10.5*  HCT 32.4*  MCV 103.2*  PLT 164   Basic Metabolic Panel: Recent Labs  Lab 05/13/24 0913  NA 138  K 4.1  CL 101  CO2 27  GLUCOSE 115*  BUN 25*  CREATININE 0.83  CALCIUM  10.5*   GFR: Estimated Creatinine Clearance: 52.5 mL/min (by C-G formula based on SCr of 0.83 mg/dL). Liver Function Tests: Recent Labs  Lab 05/13/24 0913  AST 25  ALT 17  ALKPHOS 93  BILITOT 0.8  PROT 7.5  ALBUMIN 3.7   Recent Labs  Lab 05/13/24 0913  LIPASE 12   Recent Labs  Lab 05/13/24 0912  AMMONIA <13   Coagulation Profile: No results for input(s): INR, PROTIME in the last 168 hours. Cardiac Enzymes: No results for input(s): CKTOTAL, CKMB, CKMBINDEX, TROPONINI, TROPONINIHS in the last 168 hours. BNP (last 3 results) Recent Labs    01/27/24 2324  BNP 144.1*   HbA1C: No results for input(s): HGBA1C in the last 72 hours. CBG: No results for input(s): GLUCAP in the last 168 hours. Lipid Profile: No results for input(s): CHOL, HDL, LDLCALC, TRIG, CHOLHDL, LDLDIRECT in the last 72 hours. Thyroid Function Tests: No results for input(s): TSH, T4TOTAL, FREET4, T3FREE, THYROIDAB in the last 72 hours. Anemia Panel: No results for input(s): VITAMINB12, FOLATE, FERRITIN, TIBC, IRON , RETICCTPCT in the last 72 hours. Urine analysis:    Component Value Date/Time   COLORURINE YELLOW (A) 02/28/2024 1742   APPEARANCEUR CLOUDY (A) 02/28/2024 1742   LABSPEC 1.023 02/28/2024 1742   PHURINE 5.0 02/28/2024 1742   GLUCOSEU 50 (A) 02/28/2024 1742   HGBUR NEGATIVE 02/28/2024 1742   BILIRUBINUR NEGATIVE 02/28/2024 1742   KETONESUR 5 (A) 02/28/2024 1742   PROTEINUR 100 (A) 02/28/2024 1742   NITRITE NEGATIVE 02/28/2024 1742    LEUKOCYTESUR LARGE (A) 02/28/2024 1742    Radiological Exams on Admission: I have personally reviewed images CT ABDOMEN PELVIS W CONTRAST Result Date: 05/13/2024 CLINICAL DATA:  Worsening abdominal pain.  Altered  mental status. EXAM: CT ABDOMEN AND PELVIS WITH CONTRAST TECHNIQUE: Multidetector CT imaging of the abdomen and pelvis was performed using the standard protocol following bolus administration of intravenous contrast. RADIATION DOSE REDUCTION: This exam was performed according to the departmental dose-optimization program which includes automated exposure control, adjustment of the mA and/or kV according to patient size and/or use of iterative reconstruction technique. CONTRAST:  OMNIPAQUE  IOHEXOL  300 MG/ML  SOLN COMPARISON:  03/05/2024 FINDINGS: Lower Chest: No acute findings.  Stable bibasilar scarring. Hepatobiliary: No suspicious hepatic masses identified. Multiple gallstones are again seen, but without signs of cholecystitis or biliary dilatation. Mild pneumobilia is seen, consistent with prior sphincterotomy. Pancreas: Diffuse parenchymal atrophy appears stable. No mass or inflammatory changes. Spleen: Within normal limits in size. Stable 2.7 cm benign-appearing cyst. Adrenals/Urinary Tract: No suspicious masses identified. A few small benign-appearing cysts are again noted (No followup imaging is recommended). A few small less than 5 mm bilateral renal calculi are seen. No evidence of ureteral calculi or hydronephrosis. Stable mild diffuse bladder wall thickening, which may be due to chronic bladder obstruction or cystitis. Stomach/Bowel: No evidence of obstruction, inflammatory process or abnormal fluid collections. Diverticulosis is seen mainly involving the descending and sigmoid colon, however there is no evidence of diverticulitis. Vascular/Lymphatic: No pathologically enlarged lymph nodes. No acute vascular findings. Prior stent graft repair of abdominal aortic aneurysm again seen.  Native aneurysm sac measures 3 point 3 cm, without significant change. Reproductive:  No mass or other significant abnormality. Other:  None. Musculoskeletal: No suspicious bone lesions identified. Severe degenerative spondylosis is seen throughout the lower thoracic and the lumbar spine. IMPRESSION: No acute findings. Stable mild diffuse bladder wall thickening, which may be due to chronic bladder outlet obstruction or cystitis. Bilateral nephrolithiasis. No evidence of ureteral calculi or hydronephrosis. Cholelithiasis. No radiographic evidence of cholecystitis. Colonic diverticulosis, without radiographic evidence of diverticulitis. Stable prior stent graft repair of abdominal aortic aneurysm. Electronically Signed   By: Norleen DELENA Kil M.D.   On: 05/13/2024 11:28   CT Head Wo Contrast Result Date: 05/13/2024 EXAM: CT HEAD WITHOUT CONTRAST 05/13/2024 10:53:49 AM TECHNIQUE: CT of the head was performed without the administration of intravenous contrast. Automated exposure control, iterative reconstruction, and/or weight based adjustment of the mA/kV was utilized to reduce the radiation dose to as low as reasonably achievable. COMPARISON: Head CT 01/27/2024. CLINICAL HISTORY: 89 year old male with 24 hours of altered mental status. FINDINGS: BRAIN AND VENTRICLES: No acute hemorrhage. No evidence of acute infarct. No hydrocephalus. No extra-axial collection. No mass effect or midline shift. Stable brain volume. Patchy and confluent periventricular white matter hypodensity. Gray-white differentiation is stable. 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 No suspicious intracranial vascular hyperdensity. Stable chronic small vessel disease. ORBITS: No gaze deviation. SINUSES: No acute abnormality. SOFT TISSUES AND SKULL: No acute soft tissue abnormality. No skull fracture. Calcified atherosclerosis at the skull base. IMPRESSION: 1. No acute intracranial abnormality. 2. Stable chronic small vessel disease.  Electronically signed by: Helayne Hurst MD 05/13/2024 11:04 AM EST RP Workstation: [id number]   DG Chest 1 View Result Date: 05/13/2024 CLINICAL DATA:  Shortness of breath. EXAM: CHEST  1 VIEW COMPARISON:  Chest CT dated 02/28/2024 FINDINGS: Shallow inspiration. No focal consolidation, pleural effusion, diffuse chronic antral coarsening and bronchitic changes. Stable cardiac silhouette. Atherosclerotic calcification of the aorta. No acute osseous pathology. IMPRESSION: 1. No acute cardiopulmonary process. 2. Chronic bronchitic changes. Electronically Signed   By: Vanetta Chou M.D.   On: 05/13/2024 09:24  EKG: My personal interpretation of EKG shows: Sinus rhythm without any acute ST changes    Assessment/Plan Principal Problem:   Acute encephalopathy Active Problems:   Chronic heart failure with preserved ejection fraction (HFpEF) (HCC)   Hypothyroidism   Hyperlipidemia   History of TIA (transient ischemic attack)   OSA (obstructive sleep apnea)   Essential hypertension   Chronic obstructive pulmonary disease (HCC)   Patient with history of dementia presenting with acute encephalopathy likely in setting of UTI versus dehydration.  Other etiologies considered and less likely.  Patient given cefepime  given prior cultures.  I reviewed culture data from 11/27 where patient grew greater than 100,000 colonies of E. coli and 10,000 colonies of Klebsiella pneumonia and the latter was resistant to cefepime .  Given he has recurrent urinary tract infections and there is significant drug resistance already present, and pt has no complaint of dysuria, no fevers or chills and hemodynamically stable vital signs, will hold off further antibiotics and continue with IVF.  It is possible patient has asymptomatic bacteriuria and empiric treatment may lead to further drug resistance.  Patient's UA and physical exam also shows significant dehydration which can lead to acute encephalopathy.  This will be treated  with IVF.  Patient was discharged on methenamine  1 g twice daily for prophylaxis and will continue this.  If pt has fever, hypotension, worsening leukocytosis will restart empiric antibiotics with meropenem . This plan was discussed with pt's son who was in agreement. I discussed with the pt's son that there are still some causes for recurrent UTI that are patient dependant including hygiene and hydration. Pt has fecal incontinence which can lead to recurrent UTI and is dehydrated which can also lead to recurrent UTI.  CHF: Appears dehydrated on exam.  It appears patient was on furosemide 40 mg but this has been held previously.  Will continue to monitor volume status and continue to hold furosemide.  Hypertension: Holding home regimen given infection and normotensive blood pressures.  Hypothyroidism: Continue home Synthroid .  Repeat TSH ordered.  Hyperlipidemia: Continue home Lipitor.  MDD/GAD: Continue home regimen  GERD: Continue home PPI  COPD: Does not appear to be in exacerbation.  Will continue to monitor.  Will use DuoNebs as needed.  Weakness: Likely secondary to deconditioning from multiple hospitalizations.  PT and OT consulted  Dementia? Chart shows patient with concern for dementia but no formal diagnosis. Per son pt is forgetful but at baseline is alert and oriented x3. He has been more confused since 01/2024 as he has been getting recurrent UTIs and has not returned to baseline. He was alert and oriented x2 prior to coming in per son. CT scan shows cerebral atrophy along with microvascular disease suggestive of vascular dementia but this is best addressed in outpatient setting. Delirium precautions ordered.   VTE prophylaxis:  SQ Heparin   Diet: N.p.o. Code Status:  DNR/DNI(Do NOT Intubate) Telemetry:  Admission status: Observation, Telemetry bed Patient is from: Home Anticipated d/c is to: Home Anticipated d/c is in: 1-2 days   Family Communication: Updated at bedside,  called son [name]. Son request daily updates  Consults called: None   Severity of Illness: The appropriate patient status for this patient is OBSERVATION. Observation status is judged to be reasonable and necessary in order to provide the required intensity of service to ensure the patient's safety. The patient's presenting symptoms, physical exam findings, and initial radiographic and laboratory data in the context of their medical condition is felt to place them at  decreased risk for further clinical deterioration. Furthermore, it is anticipated that the patient will be medically stable for discharge from the hospital within 2 midnights of admission.    Morene Bathe, MD [place] DEL. [facility]     [1]  Allergies Allergen Reactions   Captopril Cough   Quinolones Other (See Comments)    AAA patient- avoid if possible   "

## 2024-05-13 NOTE — ED Notes (Signed)
One set of blood cultures sent to lab.  

## 2024-05-13 NOTE — ED Notes (Signed)
 Pt trying to get out of bed and continually pulling off leads. Pt states I don't know where all these wires keep coming from. Pt instructed to leave them be.

## 2024-05-13 NOTE — ED Notes (Signed)
 Advised nurse that patient has ready bed

## 2024-05-13 NOTE — ED Notes (Signed)
 Fall bundle applied with socks, arm band, and bed alarm.

## 2024-05-13 NOTE — ED Notes (Signed)
 Sacral wound noted to pt's buttocks upon rolling pt. Wound bandaged prior to arrival with sacral dressing.

## 2024-05-14 DIAGNOSIS — Z1152 Encounter for screening for COVID-19: Secondary | ICD-10-CM | POA: Diagnosis not present

## 2024-05-14 DIAGNOSIS — N3090 Cystitis, unspecified without hematuria: Secondary | ICD-10-CM | POA: Diagnosis present

## 2024-05-14 DIAGNOSIS — I110 Hypertensive heart disease with heart failure: Secondary | ICD-10-CM | POA: Diagnosis present

## 2024-05-14 DIAGNOSIS — F329 Major depressive disorder, single episode, unspecified: Secondary | ICD-10-CM | POA: Diagnosis present

## 2024-05-14 DIAGNOSIS — I2510 Atherosclerotic heart disease of native coronary artery without angina pectoris: Secondary | ICD-10-CM | POA: Diagnosis present

## 2024-05-14 DIAGNOSIS — E039 Hypothyroidism, unspecified: Secondary | ICD-10-CM | POA: Diagnosis present

## 2024-05-14 DIAGNOSIS — G9341 Metabolic encephalopathy: Secondary | ICD-10-CM | POA: Diagnosis present

## 2024-05-14 DIAGNOSIS — Z95828 Presence of other vascular implants and grafts: Secondary | ICD-10-CM | POA: Diagnosis not present

## 2024-05-14 DIAGNOSIS — D509 Iron deficiency anemia, unspecified: Secondary | ICD-10-CM | POA: Diagnosis present

## 2024-05-14 DIAGNOSIS — F03918 Unspecified dementia, unspecified severity, with other behavioral disturbance: Secondary | ICD-10-CM | POA: Diagnosis present

## 2024-05-14 DIAGNOSIS — I10 Essential (primary) hypertension: Secondary | ICD-10-CM | POA: Diagnosis not present

## 2024-05-14 DIAGNOSIS — F411 Generalized anxiety disorder: Secondary | ICD-10-CM | POA: Diagnosis present

## 2024-05-14 DIAGNOSIS — E860 Dehydration: Secondary | ICD-10-CM | POA: Diagnosis present

## 2024-05-14 DIAGNOSIS — J4489 Other specified chronic obstructive pulmonary disease: Secondary | ICD-10-CM | POA: Diagnosis present

## 2024-05-14 DIAGNOSIS — N390 Urinary tract infection, site not specified: Secondary | ICD-10-CM | POA: Diagnosis not present

## 2024-05-14 DIAGNOSIS — E785 Hyperlipidemia, unspecified: Secondary | ICD-10-CM | POA: Diagnosis present

## 2024-05-14 DIAGNOSIS — G9340 Encephalopathy, unspecified: Secondary | ICD-10-CM | POA: Diagnosis present

## 2024-05-14 DIAGNOSIS — D696 Thrombocytopenia, unspecified: Secondary | ICD-10-CM | POA: Diagnosis present

## 2024-05-14 DIAGNOSIS — G4733 Obstructive sleep apnea (adult) (pediatric): Secondary | ICD-10-CM | POA: Diagnosis present

## 2024-05-14 DIAGNOSIS — Z66 Do not resuscitate: Secondary | ICD-10-CM | POA: Diagnosis present

## 2024-05-14 DIAGNOSIS — F0394 Unspecified dementia, unspecified severity, with anxiety: Secondary | ICD-10-CM | POA: Diagnosis present

## 2024-05-14 DIAGNOSIS — N3000 Acute cystitis without hematuria: Secondary | ICD-10-CM

## 2024-05-14 DIAGNOSIS — I5032 Chronic diastolic (congestive) heart failure: Secondary | ICD-10-CM

## 2024-05-14 DIAGNOSIS — G4700 Insomnia, unspecified: Secondary | ICD-10-CM | POA: Diagnosis present

## 2024-05-14 DIAGNOSIS — F0393 Unspecified dementia, unspecified severity, with mood disturbance: Secondary | ICD-10-CM | POA: Diagnosis present

## 2024-05-14 DIAGNOSIS — L89152 Pressure ulcer of sacral region, stage 2: Secondary | ICD-10-CM | POA: Diagnosis present

## 2024-05-14 LAB — BASIC METABOLIC PANEL WITH GFR
Anion gap: 10 (ref 5–15)
BUN: 22 mg/dL (ref 8–23)
CO2: 25 mmol/L (ref 22–32)
Calcium: 9.8 mg/dL (ref 8.9–10.3)
Chloride: 102 mmol/L (ref 98–111)
Creatinine, Ser: 0.74 mg/dL (ref 0.61–1.24)
GFR, Estimated: >60 mL/min
Glucose, Bld: 101 mg/dL — ABNORMAL HIGH (ref 70–99)
Potassium: 3.8 mmol/L (ref 3.5–5.1)
Sodium: 138 mmol/L (ref 135–145)

## 2024-05-14 LAB — CBC
HCT: 29.2 % — ABNORMAL LOW (ref 39.0–52.0)
Hemoglobin: 9.6 g/dL — ABNORMAL LOW (ref 13.0–17.0)
MCH: 32.9 pg (ref 26.0–34.0)
MCHC: 32.9 g/dL (ref 30.0–36.0)
MCV: 100 fL (ref 80.0–100.0)
Platelets: 163 10*3/uL (ref 150–400)
RBC: 2.92 MIL/uL — ABNORMAL LOW (ref 4.22–5.81)
RDW: 15.3 % (ref 11.5–15.5)
WBC: 9.3 10*3/uL (ref 4.0–10.5)
nRBC: 0 % (ref 0.0–0.2)

## 2024-05-14 LAB — GLUCOSE, CAPILLARY
Glucose-Capillary: 100 mg/dL — ABNORMAL HIGH (ref 70–99)
Glucose-Capillary: 135 mg/dL — ABNORMAL HIGH (ref 70–99)

## 2024-05-14 MED ORDER — ENSURE PLUS HIGH PROTEIN PO LIQD
237.0000 mL | Freq: Two times a day (BID) | ORAL | Status: DC
  Administered 2024-05-14 – 2024-05-17 (×6): 237 mL via ORAL

## 2024-05-14 MED ORDER — QUETIAPINE FUMARATE 25 MG PO TABS
25.0000 mg | ORAL_TABLET | Freq: Every day | ORAL | Status: DC
  Administered 2024-05-14 – 2024-05-15 (×2): 25 mg via ORAL
  Filled 2024-05-14 (×2): qty 1

## 2024-05-14 MED ORDER — POLYSACCHARIDE IRON COMPLEX 150 MG PO CAPS
150.0000 mg | ORAL_CAPSULE | Freq: Every day | ORAL | Status: DC
  Administered 2024-05-14 – 2024-05-18 (×4): 150 mg via ORAL
  Filled 2024-05-14 (×5): qty 1

## 2024-05-14 NOTE — TOC Initial Note (Addendum)
 Transition of Care (TOC) - Initial/Assessment Note    Patient Details  Name: Bryan Howard. MRN: [id number] Date of Birth: [birth year]  Transition of Care [facility]) CM/SW Contact:    Daved JONETTA Hamilton, RN Phone Number: 05/14/2024, 5:02 PM  Clinical Narrative:                  Spoke with patient's son Adiel Erney, introduced self and explained role. Patient's son verbalized that prior to this hospital encounter, patient still living in his apartment alone. Per patient's son, patient recently left [facility]MA on 05/05/24 and [facility] was to start on 05/06/24 for wound care. Patient's son verbalized that patient would like to go back to rehab however, they would like patient to go to [facility]. Patient's son verbalized they don't have a second choice.  This CM reached out to Randall with [facility] to confirm patient is current with them for wound care, awaiting reply.   PASRR Obtained- [id number] H  FL2 sent for signature  Referral to [facility] sent in HUB   Expected Discharge Plan: Skilled Nursing Facility Barriers to Discharge: Continued Medical Work up   Patient Goals and CMS Choice     Choice offered to / list presented to : Adult Children      Expected Discharge Plan and Services   Discharge Planning Services: CM Consult Post Acute Care Choice: Skilled Nursing Facility Living arrangements for the past 2 months: Apartment                                      Prior Living Arrangements/Services Living arrangements for the past 2 months: Apartment Lives with:: Self Patient language and need for interpreter reviewed:: Yes        Need for Family Participation in Patient Care: Yes (Comment) Care giver support system in place?: Yes (comment) Current home services: Home PT, Home OT ([facility]) Criminal Activity/Legal Involvement Pertinent to Current Situation/Hospitalization: No - Comment as needed  Activities of Daily Living   ADL Screening  (condition at time of admission) Independently performs ADLs?: No  Permission Sought/Granted                  Emotional Assessment       Orientation: : Oriented to Self Alcohol / Substance Use: Not Applicable Psych Involvement: No (comment)  Admission diagnosis:  Acute encephalopathy [G93.40] Urinary tract infection without hematuria, site unspecified [N39.0] Patient Active Problem List   Diagnosis Date Noted   Acute encephalopathy 05/13/2024   Malnutrition of moderate degree 02/29/2024   Septic shock (HCC) 02/28/2024   Family hx of prostate cancer 02/23/2024   Urinary tract infection 01/28/2024   Recurrent UTI 01/27/2024   Chronic heart failure with preserved ejection fraction (HFpEF) (HCC) 01/27/2024   Acute metabolic encephalopathy 01/27/2024   History of orchiectomy 01/27/2024   Hypotension 01/27/2024   History of spinal surgery 01/27/2024   Moderate aortic stenosis 09/29/2023   Mild cognitive impairment 09/29/2023   Acute cystitis without hematuria 09/25/2023   Constipation 08/01/2023   Infrarenal abdominal aortic aneurysm (AAA) without rupture 01/03/2023   Personal history of other malignant neoplasm of skin 11/26/2022   Fall 11/19/2022   Chronic obstructive pulmonary disease (HCC) 11/10/2022   Congestive heart failure (HCC) 11/10/2022   Current moderate episode of major depressive disorder (HCC) 11/10/2022   History of pulmonary embolism 09/29/2022   Acute pulmonary embolism (HCC)  09/29/2022   Epididymo-orchitis 10/01/2019   Lethargy 10/01/2019   Hx of bacterial meningitis 09/19/2019   Hx of osteomyelitis 09/19/2019   Essential hypertension 12/11/2017   Degenerative disc disease, lumbar 12/11/2017   Right inguinal hernia 12/11/2017   Osteoarthritis of both glenohumeral joints 08/05/2017   Disequilibrium 09/29/2016   PD (perceptive deafness), asymmetrical 09/29/2016   Generalized muscle weakness 08/20/2016   Primary osteoarthritis of right hip 07/16/2016    Right lumbar radiculitis 06/22/2016   Greater trochanteric bursitis of left hip 06/18/2016   Greater trochanteric bursitis of right hip 06/18/2016   Choledocholithiasis 03/19/2016   Depression 12/30/2011   Obesity, unspecified 12/28/2011   Transfusion of blood product refused for religious reason 12/14/2011   OSA (obstructive sleep apnea) 09/08/2011   Hypothyroidism 10/13/2007   Hyperlipidemia 10/13/2007   Coronary atherosclerosis 10/13/2007   External hemorrhoids 10/13/2007   ESOPHAGEAL STRICTURE 10/13/2007   GERD 10/13/2007   Diaphragmatic hernia 10/13/2007   Diverticulosis of colon 10/13/2007   History of TIA (transient ischemic attack) 10/13/2007   History of colonic polyps 10/13/2007   PCP:  Jyl Railing, MD Pharmacy:   [facility] [address]), [place] - [address] [place]) [place] [address] Phone: [phone] Fax: [phone]     Social Drivers of Health (SDOH) Social History: SDOH Screenings   Food Insecurity: No Food Insecurity (04/06/2024)   Received from [facility]  Housing: Low Risk  (05/05/2024)   Received from [facility]  Transportation Needs: No Transportation Needs (04/06/2024)   Received from [facility]  Utilities: Not At Risk (04/01/2024)   Received from [facility]  Financial Resource Strain: Low Risk  (04/06/2024)   Received from [facility]  Physical Activity: Insufficiently Active (01/04/2024)   Received from [facility]  Social Connections: Moderately Isolated (02/29/2024)  Stress: No Stress Concern Present (01/04/2024)   Received from [facility]  Tobacco Use: Medium Risk (05/10/2024)   Received from [facility]  Health Literacy: Inadequate Health Literacy (01/04/2024)   Received from [facility]   SDOH Interventions:      Readmission Risk Interventions     No data to display

## 2024-05-14 NOTE — Evaluation (Signed)
 Physical Therapy Evaluation Patient Details Name: Bryan Howard. MRN: [id number] DOB: 28-Jan-1933 Today's Date: 05/14/2024  History of Present Illness  This is an 89 year old Caucasian male with a past medical history of recurrent UTIs, AAA, CAD, diverticulosis, esophageal stricture, GERD, hyperlipidemia, hyperthyroidism and hypothyroidism presenting to the ED via EMS after son noted him to be confused.   Clinical Impression  Patient alert, agreeable to PT/OT, oriented x1. Pt unable to provide PLOF, but per chart review has aides (pt did endorse this), and ambulatory intermittently with minA. Pt required modA for bed mobility, sit <> stand with RW and modAx2, attempted lateral side step with RW maxAx2 but unable. Squat pivot to recliner with maxAx2 (2+ for safety).  Overall the patient demonstrated deficits (see PT Problem List) that impede the patient's functional abilities, safety, and mobility and would benefit from skilled PT intervention.          If plan is discharge home, recommend the following: Two people to help with walking and/or transfers;A lot of help with bathing/dressing/bathroom;Assistance with cooking/housework;Assist for transportation;Help with stairs or ramp for entrance   Can travel by private vehicle   No    Equipment Recommendations None recommended by PT  Recommendations for Other Services       Functional Status Assessment Patient has had a recent decline in their functional status and demonstrates the ability to make significant improvements in function in a reasonable and predictable amount of time.     Precautions / Restrictions Precautions Precautions: Fall Recall of Precautions/Restrictions: Impaired Restrictions Weight Bearing Restrictions Per Provider Order: No      Mobility  Bed Mobility Overal bed mobility: Needs Assistance Bed Mobility: Supine to Sit     Supine to sit: Mod assist, +2 for safety/equipment, HOB elevated, Used rails           Transfers Overall transfer level: Needs assistance Equipment used: Rolling walker (2 wheels) Transfers: Sit to/from Stand, Bed to chair/wheelchair/BSC Sit to Stand: +2 physical assistance, +2 safety/equipment, Mod assist     Squat pivot transfers: Mod assist, +2 physical assistance     General transfer comment: squat pivot to recliner maxAx2 for safety, pt unable to laterally step despite maxA    Ambulation/Gait               General Gait Details: unable  Stairs            Wheelchair Mobility     Tilt Bed    Modified Rankin (Stroke Patients Only)       Balance Overall balance assessment: Needs assistance Sitting-balance support: Feet supported Sitting balance-Leahy Scale: Fair       Standing balance-Leahy Scale: Poor                               Pertinent Vitals/Pain Pain Assessment Pain Assessment: Faces    Home Living Family/patient expects to be discharged to:: Private residence Living Arrangements: Alone Available Help at Discharge: Personal care attendant;Available PRN/intermittently Type of Home: Apartment         Home Layout: One level Home Equipment: Agricultural Consultant (2 wheels);Shower seat;Grab bars - toilet;Grab bars - tub/shower;BSC/3in1      Prior Function Prior Level of Function : Independent/Modified Independent;Patient poor historian/Family not available             Mobility Comments: per chart review ambulatory with walker with aides, pt unable to confirm  Extremity/Trunk Assessment   Upper Extremity Assessment Upper Extremity Assessment: Generalized weakness    Lower Extremity Assessment Lower Extremity Assessment: Generalized weakness (more difficulty lifting RLE compared to LLE)       Communication   Communication Communication: Impaired Factors Affecting Communication: Hearing impaired    Cognition Arousal: Alert Behavior During Therapy: WFL for tasks assessed/performed                            PT - Cognition Comments: pt oriented to self only Following commands: Impaired Following commands impaired: Follows one step commands with increased time     Cueing       General Comments      Exercises     Assessment/Plan    PT Assessment Patient needs continued PT services  PT Problem List Decreased strength;Decreased coordination;Decreased range of motion;Decreased activity tolerance;Decreased knowledge of use of DME;Decreased balance;Decreased mobility;Decreased knowledge of precautions       PT Treatment Interventions DME instruction;Balance training;Neuromuscular re-education;Gait training;Stair training;Functional mobility training;Patient/family education;Therapeutic activities;Therapeutic exercise;Wheelchair mobility training    PT Goals (Current goals can be found in the Care Plan section)  Acute Rehab PT Goals PT Goal Formulation: Patient unable to participate in goal setting Time For Goal Achievement: 05/28/24 Potential to Achieve Goals: Fair    Frequency Min 2X/week     Co-evaluation PT/OT/SLP Co-Evaluation/Treatment: Yes Reason for Co-Treatment: For patient/therapist safety PT goals addressed during session: Mobility/safety with mobility OT goals addressed during session: ADL's and self-care       AM-PAC PT 6 Clicks Mobility  Outcome Measure Help needed turning from your back to your side while in a flat bed without using bedrails?: A Lot Help needed moving from lying on your back to sitting on the side of a flat bed without using bedrails?: A Lot Help needed moving to and from a bed to a chair (including a wheelchair)?: A Lot Help needed standing up from a chair using your arms (e.g., wheelchair or bedside chair)?: A Lot Help needed to walk in hospital room?: Total Help needed climbing 3-5 steps with a railing? : Total 6 Click Score: 10    End of Session   Activity Tolerance: Patient tolerated treatment  well Patient left: in chair;with call bell/phone within reach;with chair alarm set Nurse Communication: Mobility status PT Visit Diagnosis: Other abnormalities of gait and mobility (R26.89);Difficulty in walking, not elsewhere classified (R26.2);Muscle weakness (generalized) (M62.81)    Time: 8985-8968 PT Time Calculation (min) (ACUTE ONLY): 17 min   Charges:   PT Evaluation $PT Eval Low Complexity: 1 Low PT Treatments $Therapeutic Activity: 8-22 mins PT General Charges $$ ACUTE PT VISIT: 1 Visit         Doyal Shams PT, DPT 1:30 PM,05/14/24

## 2024-05-14 NOTE — Progress Notes (Signed)
 " Progress Note   Patient: Bryan Howard. FMW:[id number] DOB: [birth year] DOA: 05/13/2024     0 DOS: the patient was seen and examined on 05/14/2024   Brief hospital course: Bryan Howard. is a 89 y.o. year old male with medical history of HTN, HLD, hypothyroidism, and recurrent urinary tract infections presenting to the ED via EMS after son noted him to be confused.  He also had abnormal urine, he was diagnosed with a urinary tract infection.  Started on IV antibiotics. Per patient's son, patient had 5 episodes of UTI in the past 2 months.  He has been mostly in the hospital during the past 2 months.  He had a cystoscopy in [facility], found to have significant debris seen in the bladder.  He was washed out.  After that, he he did not have a UTI for a month.   Principal Problem:   Acute encephalopathy Active Problems:   Chronic heart failure with preserved ejection fraction (HFpEF) (HCC)   Hypothyroidism   Hyperlipidemia   History of TIA (transient ischemic attack)   OSA (obstructive sleep apnea)   Essential hypertension   Chronic obstructive pulmonary disease (HCC)   Assessment and Plan: Acute metabolic encephalopathy secondary to UTI. Urinary tract infection with recurrent UTI. Discussed with patient son, patient does not have dementia diagnosed in the past.  He had increased confusion over the last few days, he had a abnormal urine, were started on antibiotics for UTI.  Pending culture results. Patient had recurrent UTI from bladder debris, etiology unclear.  Son requested a consult from urology here.  Will obtain consult. Planning to treat for 10 days with antibiotics. Altered mental status appears to be secondary to infection, patient also had some insomnia.  Added Seroquel .  Chronic diastolic congestive heart failure. Recent echocardiogram performed 11/25, showed ejection fraction 60 to 65% with grade 2 diastolic dysfunction.  Normal valvular disease. Currently patient does not  have any volume overload.  No exacerbation.  Essential hypertension. Blood pressure stable, hold off blood pressure medicine.  COPD. No exacerbation.  Generalized weakness. Patient has progressive weakness over the last 2 months, worsening at this admission.  Obtain PT/OT.  May need nursing placement  Iron  deficient anemia. Reviewed prior labs, patient had mild iron  deficiency, start iron  supplement      Subjective:  Patient feels better today, but still has some confusion.  Physical Exam: Vitals:   05/14/24 0427 05/14/24 0429 05/14/24 0804 05/14/24 1144  BP: 107/65  133/67 (!) 153/77  Pulse: 95  92 95  Resp: 18  17 18   Temp: 99.2 F (37.3 C)  98.7 F (37.1 C) 97.9 F (36.6 C)  TempSrc: Oral     SpO2: 99%  93% 97%  Weight:  65.6 kg     General exam: Appears calm and comfortable  Respiratory system: Clear to auscultation. Respiratory effort normal. Cardiovascular system: S1 & S2 heard, RRR. No JVD, murmurs, rubs, gallops or clicks. No pedal edema. Gastrointestinal system: Abdomen is nondistended, soft and nontender. No organomegaly or masses felt. Normal bowel sounds heard. Central nervous system: Alert and oriented x2. No focal neurological deficits. Extremities: Symmetric 5 x 5 power. Skin: No rashes, lesions or ulcers Psychiatry: Judgement and insight appear normal. Mood & affect appropriate.    Data Reviewed:  Lab results reviewed.  Family Communication: Son updated the bedside.  Disposition: Status is: Inpatient Remains inpatient appropriate because: Severity of disease, IV treatment     Time spent: 35 minutes  Author: Murvin Mana, MD 05/14/2024 12:21 PM  For on call review [email or web address].    "

## 2024-05-14 NOTE — Hospital Course (Addendum)
 Bryan Howard. is a 89 y.o. year old male with medical history of HTN, HLD, hypothyroidism, and recurrent urinary tract infections presenting to the ED via EMS after son noted him to be confused.  He also had abnormal urine, he was diagnosed with a urinary tract infection.  Started on IV antibiotics. Per patient's son, patient had 5 episodes of UTI in the past 2 months.  He has been mostly in the hospital during the past 2 months.  He had a cystoscopy in [facility], found to have significant debris seen in the bladder.  He was washed out.  After that, he he did not have a UTI for a month. Discussed with Dr. Francisca, recommended referral back to [facility] urology after discharge.  No indication for cystoscopy while inpatient. Urine culture grew Pseudomonas, blood culture negative.  Antibiotics continued on meropenem .

## 2024-05-14 NOTE — Plan of Care (Signed)
  Problem: Clinical Measurements: Goal: Ability to maintain clinical measurements within normal limits will improve Outcome: Progressing   Problem: Elimination: Goal: Will not experience complications related to bowel motility Outcome: Progressing   Problem: Pain Managment: Goal: General experience of comfort will improve and/or be controlled Outcome: Progressing   Problem: Safety: Goal: Ability to remain free from injury will improve Outcome: Progressing

## 2024-05-14 NOTE — [place] FL2 (Signed)
 " [place]  MEDICAID FL2 LEVEL OF CARE FORM     IDENTIFICATION  Patient Name: Bryan Howard. Birthdate: [birth year] Sex: male Admission Date (Current Location): 05/13/2024  [facility] and [place] Number:  Chiropodist and Address:  [facility], [address] [place], [place] [address]      Provider Number: [id number]  Attending Physician Name and Address:  Laurita Pillion, MD  Relative Name and Phone Number:  Jarrad Mclees [phone]    Current Level of Care: Hospital Recommended Level of Care: Skilled Nursing Facility Prior Approval Number:    Date Approved/Denied:   PASRR Number: [id number]  Discharge Plan: SNF    Current Diagnoses: Patient Active Problem List   Diagnosis Date Noted   Acute encephalopathy 05/13/2024   Malnutrition of moderate degree 02/29/2024   Septic shock (HCC) 02/28/2024   Family hx of prostate cancer 02/23/2024   Urinary tract infection 01/28/2024   Recurrent UTI 01/27/2024   Chronic heart failure with preserved ejection fraction (HFpEF) (HCC) 01/27/2024   Acute metabolic encephalopathy 01/27/2024   History of orchiectomy 01/27/2024   Hypotension 01/27/2024   History of spinal surgery 01/27/2024   Moderate aortic stenosis 09/29/2023   Mild cognitive impairment 09/29/2023   Acute cystitis without hematuria 09/25/2023   Constipation 08/01/2023   Infrarenal abdominal aortic aneurysm (AAA) without rupture 01/03/2023   Personal history of other malignant neoplasm of skin 11/26/2022   Fall 11/19/2022   Chronic obstructive pulmonary disease (HCC) 11/10/2022   Congestive heart failure (HCC) 11/10/2022   Current moderate episode of major depressive disorder (HCC) 11/10/2022   History of pulmonary embolism 09/29/2022   Acute pulmonary embolism (HCC) 09/29/2022   Epididymo-orchitis 10/01/2019   Lethargy 10/01/2019   Hx of bacterial meningitis 09/19/2019   Hx of osteomyelitis 09/19/2019   Essential hypertension  12/11/2017   Degenerative disc disease, lumbar 12/11/2017   Right inguinal hernia 12/11/2017   Osteoarthritis of both glenohumeral joints 08/05/2017   Disequilibrium 09/29/2016   PD (perceptive deafness), asymmetrical 09/29/2016   Generalized muscle weakness 08/20/2016   Primary osteoarthritis of right hip 07/16/2016   Right lumbar radiculitis 06/22/2016   Greater trochanteric bursitis of left hip 06/18/2016   Greater trochanteric bursitis of right hip 06/18/2016   Choledocholithiasis 03/19/2016   Depression 12/30/2011   Obesity, unspecified 12/28/2011   Transfusion of blood product refused for religious reason 12/14/2011   OSA (obstructive sleep apnea) 09/08/2011   Hypothyroidism 10/13/2007   Hyperlipidemia 10/13/2007   Coronary atherosclerosis 10/13/2007   External hemorrhoids 10/13/2007   ESOPHAGEAL STRICTURE 10/13/2007   GERD 10/13/2007   Diaphragmatic hernia 10/13/2007   Diverticulosis of colon 10/13/2007   History of TIA (transient ischemic attack) 10/13/2007   History of colonic polyps 10/13/2007    Orientation RESPIRATION BLADDER Height & Weight     Self  Normal Incontinent Weight: 65.6 kg Height:     BEHAVIORAL SYMPTOMS/MOOD NEUROLOGICAL BOWEL NUTRITION STATUS      Incontinent Diet (Regular)  AMBULATORY STATUS COMMUNICATION OF NEEDS Skin   Extensive Assist Verbally PU Stage and Appropriate Care   PU Stage 2 Dressing:  (Foam lift dressing)                   Personal Care Assistance Level of Assistance  Bathing, Dressing Bathing Assistance: Maximum assistance   Dressing Assistance: Maximum assistance     Functional Limitations Info  Hearing   Hearing Info: Impaired      SPECIAL CARE FACTORS FREQUENCY  PT (By licensed  PT), OT (By licensed OT)     PT Frequency: 5x week OT Frequency: 5x week            Contractures Contractures Info: Not present    Additional Factors Info  Code Status, Allergies Code Status Info: DNR Allergies Info:  Captopril, Quinolones           Current Medications (05/14/2024):  This is the current hospital active medication list Current Facility-Administered Medications  Medication Dose Route Frequency Provider Last Rate Last Admin   acetaminophen  (TYLENOL ) tablet 650 mg  650 mg Oral Q6H PRN Fernand Prost, MD       Or   acetaminophen  (TYLENOL ) suppository 650 mg  650 mg Rectal Q6H PRN Fernand Prost, MD       atorvastatin  (LIPITOR) tablet 80 mg  80 mg Oral Daily Fernand Prost, MD   80 mg at 05/14/24 1059   cholecalciferol  (VITAMIN D3) tablet 1,000 Units  1,000 Units Oral Daily Fernand Prost, MD   1,000 Units at 05/14/24 1100   DULoxetine  (CYMBALTA ) DR capsule 60 mg  60 mg Oral Daily Khan, Ghalib, MD   60 mg at 05/14/24 1100   feeding supplement (ENSURE PLUS HIGH PROTEIN) liquid 237 mL  237 mL Oral BID BM Laurita Pillion, MD   237 mL at 05/14/24 1319   heparin  injection 5,000 Units  5,000 Units Subcutaneous Q8H Fernand Prost, MD   5,000 Units at 05/14/24 1319   iron  polysaccharides (NIFEREX) capsule 150 mg  150 mg Oral Daily Zhang, Dekui, MD   150 mg at 05/14/24 1319   levothyroxine  (SYNTHROID ) tablet 100 mcg  100 mcg Oral Q0600 Khan, Ghalib, MD   100 mcg at 05/14/24 9472   methenamine  (MANDELAMINE) tablet 1 g  1 g Oral BID WC Fernand Prost, MD   1 g at 05/14/24 1653   OLANZapine  (ZYPREXA ) injection 2.5 mg  2.5 mg Intramuscular Once PRN Khan, Ghalib, MD       ondansetron  (ZOFRAN ) tablet 4 mg  4 mg Oral Q6H PRN Fernand Prost, MD       Or   ondansetron  (ZOFRAN ) injection 4 mg  4 mg Intravenous Q6H PRN Fernand Prost, MD       QUEtiapine  (SEROQUEL ) tablet 25 mg  25 mg Oral QHS Laurita Pillion, MD       senna-docusate (Senokot-S) tablet 1 tablet  1 tablet Oral QHS PRN Fernand Prost, MD       sodium chloride  flush (NS) 0.9 % injection 3 mL  3 mL Intravenous Q12H Khan, Ghalib, MD   3 mL at 05/14/24 1101     Discharge Medications: Please see discharge summary for a list of discharge medications.  Relevant Imaging  Results:  Relevant Lab Results:   Additional Information SS# [id number]  Daved JONETTA Hamilton, RN     "

## 2024-05-14 NOTE — Evaluation (Signed)
 Occupational Therapy Evaluation Patient Details Name: Bryan Howard. MRN: [id number] DOB: 08-10-1932 Today's Date: 05/14/2024   History of Present Illness   This is an 89 year old Caucasian male with a past medical history of recurrent UTIs, AAA, CAD, diverticulosis, esophageal stricture, GERD, hyperlipidemia, hyperthyroidism and hypothyroidism presenting to the ED via EMS after son noted him to be confused.     Clinical Impressions Pt was seen for OT evaluation this date and co-tx with PT for safety. Prior to hospital admission, pt was living alone per chart. However, pt reports living with his son. Pt denies need for assist with bathing, dressing, and toileting at baseline, endorses using RW. Pt oriented to self and requires increased time to follow commands. Pt presents with deficits in strength, balance, and cognition limiting his ability to perform ADL management at baseline level. Pt currently requires MOD A +2 safety for bed mobility, MODA  +2 assist for STS and squat pivot to recliner. Pt requires MAX A for all aspects of bathing, dressing, and toileting. Pt does endorse having some baseline R shoulder ROM deficits, resulting in need for MOD A to comb hair. Set up for washing face from recliner. Pt would benefit from skilled OT services to address noted impairments and functional limitations (see below for any additional details) in order to maximize safety and independence while minimizing future risk of falls, injury, and readmission. Anticipate the need for follow up OT services upon acute hospital DC.    If plan is discharge home, recommend the following:   A lot of help with bathing/dressing/bathroom;A lot of help with walking and/or transfers;Assistance with cooking/housework;Assist for transportation;Help with stairs or ramp for entrance;Direct supervision/assist for medications management;Supervision due to cognitive status;Direct supervision/assist for financial management      Functional Status Assessment   Patient has had a recent decline in their functional status and demonstrates the ability to make significant improvements in function in a reasonable and predictable amount of time.     Equipment Recommendations   Other (comment) (defer)     Recommendations for Other Services         Precautions/Restrictions   Precautions Precautions: Fall Recall of Precautions/Restrictions: Impaired Restrictions Weight Bearing Restrictions Per Provider Order: No     Mobility Bed Mobility Overal bed mobility: Needs Assistance Bed Mobility: Supine to Sit     Supine to sit: Mod assist, +2 for safety/equipment, HOB elevated, Used rails     General bed mobility comments: vc for sequencing    Transfers Overall transfer level: Needs assistance Equipment used: Rolling walker (2 wheels) Transfers: Sit to/from Stand, Bed to chair/wheelchair/BSC Sit to Stand: +2 physical assistance, +2 safety/equipment, Mod assist   Squat pivot transfers: Mod assist, +2 physical assistance       General transfer comment: squat pivot to recliner maxAx2 for safety, pt unable to laterally step despite maxA      Balance Overall balance assessment: Needs assistance Sitting-balance support: Feet supported Sitting balance-Leahy Scale: Fair       Standing balance-Leahy Scale: Poor                             ADL either performed or assessed with clinical judgement   ADL Overall ADL's : Needs assistance/impaired     Grooming: Sitting;Moderate assistance;Set up;Wash/dry face;Brushing hair Grooming Details (indicate cue type and reason): MOD A for combing hair 2/2 hx of R shoulder issues per pt; set up for washing face; VC  for initiating             Lower Body Dressing: Sitting/lateral leans;Maximal assistance Lower Body Dressing Details (indicate cue type and reason): socks                     Vision         Perception          Praxis         Pertinent Vitals/Pain Pain Assessment Pain Assessment: Faces Faces Pain Scale: No hurt Pain Intervention(s): Monitored during session     Extremity/Trunk Assessment Upper Extremity Assessment Upper Extremity Assessment: Generalized weakness   Lower Extremity Assessment Lower Extremity Assessment: Generalized weakness (more difficulty lifting RLE compared to LLE)       Communication Communication Communication: Impaired Factors Affecting Communication: Hearing impaired   Cognition Arousal: Alert Behavior During Therapy: WFL for tasks assessed/performed Cognition: No family/caregiver present to determine baseline             OT - Cognition Comments: Oriented to self, follows simple commands with incr time, slow processing                 Following commands: Impaired Following commands impaired: Follows one step commands with increased time     Cueing  General Comments   Cueing Techniques: Verbal cues;Gestural cues;Tactile cues      Exercises     Shoulder Instructions      Home Living Family/patient expects to be discharged to:: Private residence Living Arrangements: Alone Available Help at Discharge: Personal care attendant;Available PRN/intermittently Type of Home: Apartment       Home Layout: One level     Bathroom Shower/Tub: Producer, Television/film/video: Standard Bathroom Accessibility: Yes   Home Equipment: Agricultural Consultant (2 wheels);Shower seat;Grab bars - toilet;Grab bars - tub/shower;BSC/3in1   Additional Comments: Per chart pt lives alone; pt during session reports living with son several times      Prior Functioning/Environment Prior Level of Function : Independent/Modified Independent;Patient poor historian/Family not available             Mobility Comments: per chart review ambulatory with walker with aides, pt unable to confirm ADLs Comments: Pt reports he is typically able to get to/from the bathroom  indep, denies need for assist for bathing/dressing. No family present to verify    OT Problem List: Decreased strength;Decreased cognition;Decreased safety awareness;Decreased activity tolerance;Impaired balance (sitting and/or standing);Decreased knowledge of use of DME or AE   OT Treatment/Interventions: Self-care/ADL training;Therapeutic exercise;Energy conservation;DME and/or AE instruction;Patient/family education;Cognitive remediation/compensation;Therapeutic activities;Balance training      OT Goals(Current goals can be found in the care plan section)   Acute Rehab OT Goals Patient Stated Goal: speak with my son OT Goal Formulation: With patient Time For Goal Achievement: 05/28/24 Potential to Achieve Goals: Fair ADL Goals Pt Will Perform Grooming: sitting;with set-up;with supervision Pt Will Transfer to Toilet: with mod assist;bedside commode;squat pivot transfer;stand pivot transfer (LRAD) Pt Will Perform Toileting - Clothing Manipulation and hygiene: sitting/lateral leans;sit to/from stand;with mod assist   OT Frequency:  Min 2X/week    Co-evaluation PT/OT/SLP Co-Evaluation/Treatment: Yes Reason for Co-Treatment: For patient/therapist safety PT goals addressed during session: Mobility/safety with mobility OT goals addressed during session: ADL's and self-care      AM-PAC OT 6 Clicks Daily Activity     Outcome Measure Help from another person eating meals?: None Help from another person taking care of personal grooming?: A Lot Help from another person toileting,  which includes using toliet, bedpan, or urinal?: A Lot Help from another person bathing (including washing, rinsing, drying)?: A Lot Help from another person to put on and taking off regular upper body clothing?: A Lot Help from another person to put on and taking off regular lower body clothing?: A Lot 6 Click Score: 14   End of Session Nurse Communication: Mobility status  Activity Tolerance: Patient  tolerated treatment well Patient left: in chair;with call bell/phone within reach;with chair alarm set  OT Visit Diagnosis: Other abnormalities of gait and mobility (R26.89);Muscle weakness (generalized) (M62.81);Other symptoms and signs involving cognitive function                Time: 8987-8966 OT Time Calculation (min): 21 min Charges:  OT General Charges $OT Visit: 1 Visit OT Evaluation $OT Eval Low Complexity: 1 Low  Warren SAUNDERS., MPH, MS, OTR/L ascom [phone] 05/14/24, 2:02 PM

## 2024-05-15 DIAGNOSIS — N390 Urinary tract infection, site not specified: Secondary | ICD-10-CM | POA: Diagnosis not present

## 2024-05-15 DIAGNOSIS — I5032 Chronic diastolic (congestive) heart failure: Secondary | ICD-10-CM | POA: Diagnosis not present

## 2024-05-15 DIAGNOSIS — G9340 Encephalopathy, unspecified: Secondary | ICD-10-CM | POA: Diagnosis not present

## 2024-05-15 DIAGNOSIS — L8990 Pressure ulcer of unspecified site, unspecified stage: Secondary | ICD-10-CM | POA: Insufficient documentation

## 2024-05-15 DIAGNOSIS — D696 Thrombocytopenia, unspecified: Secondary | ICD-10-CM | POA: Insufficient documentation

## 2024-05-15 LAB — CBC
HCT: 29.2 % — ABNORMAL LOW (ref 39.0–52.0)
Hemoglobin: 9.5 g/dL — ABNORMAL LOW (ref 13.0–17.0)
MCH: 33.3 pg (ref 26.0–34.0)
MCHC: 32.5 g/dL (ref 30.0–36.0)
MCV: 102.5 fL — ABNORMAL HIGH (ref 80.0–100.0)
Platelets: 145 10*3/uL — ABNORMAL LOW (ref 150–400)
RBC: 2.85 MIL/uL — ABNORMAL LOW (ref 4.22–5.81)
RDW: 15.2 % (ref 11.5–15.5)
WBC: 5.3 10*3/uL (ref 4.0–10.5)
nRBC: 0 % (ref 0.0–0.2)

## 2024-05-15 LAB — URINE CULTURE: Culture: 50000 — AB

## 2024-05-15 LAB — BASIC METABOLIC PANEL WITH GFR
Anion gap: 9 (ref 5–15)
BUN: 28 mg/dL — ABNORMAL HIGH (ref 8–23)
CO2: 27 mmol/L (ref 22–32)
Calcium: 9.7 mg/dL (ref 8.9–10.3)
Chloride: 103 mmol/L (ref 98–111)
Creatinine, Ser: 0.75 mg/dL (ref 0.61–1.24)
GFR, Estimated: >60 mL/min
Glucose, Bld: 94 mg/dL (ref 70–99)
Potassium: 3.5 mmol/L (ref 3.5–5.1)
Sodium: 138 mmol/L (ref 135–145)

## 2024-05-15 LAB — GLUCOSE, CAPILLARY: Glucose-Capillary: 95 mg/dL (ref 70–99)

## 2024-05-15 MED ORDER — SODIUM CHLORIDE 0.9 % IV SOLN
1.0000 g | Freq: Three times a day (TID) | INTRAVENOUS | Status: DC
  Administered 2024-05-15: 1 g via INTRAVENOUS
  Filled 2024-05-15 (×2): qty 20

## 2024-05-15 MED ORDER — SODIUM CHLORIDE 0.9 % IV SOLN
2.0000 g | Freq: Three times a day (TID) | INTRAVENOUS | Status: DC
  Administered 2024-05-15 – 2024-05-18 (×10): 2 g via INTRAVENOUS
  Filled 2024-05-15 (×12): qty 2

## 2024-05-15 MED ORDER — SODIUM CHLORIDE 0.9 % IV BOLUS
500.0000 mL | Freq: Once | INTRAVENOUS | Status: AC
  Administered 2024-05-15: 500 mL via INTRAVENOUS

## 2024-05-15 NOTE — Progress Notes (Signed)
 Occupational Therapy Treatment Patient Details Name: Bryan Howard. MRN: [id number] DOB: January 04, 1933 Today's Date: 05/15/2024   History of present illness This is an 89 year old Caucasian male with a past medical history of recurrent UTIs, AAA, CAD, diverticulosis, esophageal stricture, GERD, hyperlipidemia, hyperthyroidism and hypothyroidism presenting to the ED via EMS after son noted him to be confused.   OT comments  Upon entering the room, pt supine in bed and agreeable to OT intervention. Pt is confused and oriented to self only. Max A of 2 for bed mobility. Pt stands with mod A of 2 and use of RW. Pt found to be incontinent of bowel and bladder but unaware. Pt returned to supine and needing +2 assistance to roll in bed and for hygiene. Pt needing assistance to change hospital gown and linens. While providing assistance for hygiene, pt begins to urinate on himself in bed. Pt reports knowing that he is urinating but has no situational awareness this session. Pt remains supine with all needs within reach and RN notified of need for new external catheter.       If plan is discharge home, recommend the following:  A lot of help with bathing/dressing/bathroom;A lot of help with walking and/or transfers;Assistance with cooking/housework;Assist for transportation;Help with stairs or ramp for entrance;Direct supervision/assist for medications management;Supervision due to cognitive status;Direct supervision/assist for financial management   Equipment Recommendations  Other (comment) (defer)       Precautions / Restrictions Precautions Precautions: Fall Recall of Precautions/Restrictions: Impaired       Mobility Bed Mobility Overal bed mobility: Needs Assistance Bed Mobility: Rolling Rolling: Max assist, +2 for physical assistance              Transfers Overall transfer level: Needs assistance Equipment used: Rolling walker (2 wheels) Transfers: Sit to/from Stand Sit to Stand:  Mod assist, +2 physical assistance                 Balance Overall balance assessment: Needs assistance Sitting-balance support: Feet supported Sitting balance-Leahy Scale: Fair     Standing balance support: Reliant on assistive device for balance, Bilateral upper extremity supported, During functional activity Standing balance-Leahy Scale: Poor                             ADL either performed or assessed with clinical judgement   ADL Overall ADL's : Needs assistance/impaired                                       General ADL Comments: pt incontinent of BM and urine needing total A to change gown, for hygiene, and to change linen    Extremity/Trunk Assessment Upper Extremity Assessment Upper Extremity Assessment: Generalized weakness   Lower Extremity Assessment Lower Extremity Assessment: Generalized weakness        Vision Patient Visual Report: No change from baseline           Communication Communication Communication: Impaired Factors Affecting Communication: Hearing impaired   Cognition Arousal: Alert Behavior During Therapy: WFL for tasks assessed/performed Cognition: No family/caregiver present to determine baseline, Cognition impaired   Orientation impairments: Place, Time, Situation Awareness: Intellectual awareness impaired, Online awareness impaired     Executive functioning impairment (select all impairments): Initiation, Organization, Sequencing, Reasoning, Problem solving  Following commands: Impaired Following commands impaired: Follows one step commands with increased time      Cueing   Cueing Techniques: Verbal cues, Gestural cues, Tactile cues             Pertinent Vitals/ Pain       Pain Assessment Pain Assessment: Faces Faces Pain Scale: No hurt         Frequency  Min 2X/week        Progress Toward Goals  OT Goals(current goals can now be found in the care plan  section)  Progress towards OT goals: Progressing toward goals      AM-PAC OT 6 Clicks Daily Activity     Outcome Measure   Help from another person eating meals?: A Little Help from another person taking care of personal grooming?: A Lot Help from another person toileting, which includes using toliet, bedpan, or urinal?: Total Help from another person bathing (including washing, rinsing, drying)?: Total Help from another person to put on and taking off regular upper body clothing?: A Lot Help from another person to put on and taking off regular lower body clothing?: Total 6 Click Score: 10    End of Session Equipment Utilized During Treatment: Rolling walker (2 wheels)  OT Visit Diagnosis: Other abnormalities of gait and mobility (R26.89);Muscle weakness (generalized) (M62.81);Other symptoms and signs involving cognitive function   Activity Tolerance Patient limited by fatigue   Patient Left with call bell/phone within reach;in bed;with bed alarm set   Nurse Communication Other (comment) (needs external catheter)        Time: 8562-8498 OT Time Calculation (min): 24 min  Charges: OT General Charges $OT Visit: 1 Visit OT Treatments $Self Care/Home Management : 23-37 mins  Izetta Claude, MS, OTR/L , CBIS ascom [phone]  05/15/24, 3:42 PM

## 2024-05-15 NOTE — TOC Progression Note (Signed)
 Transition of Care (TOC) - Progression Note    Patient Details  Name: Bryan Howard. MRN: [id number] Date of Birth: 04-Oct-1932  Transition of [facility]) CM/SW Contact  Daved JONETTA Hamilton, RN Phone Number: 05/15/2024, 3:25 PM  Clinical Narrative:     Spoke with patient's son [name] regarding bed offer from [facility], also discussed patient's recent rehab discharge on 1/17 from a different facility in which patient stayed 32 days. Discussed with [name] that [facility] may require upfront payment of co-pays since insurance doesn't normally cover 100% of stay after day 20. [name] verbalized understanding and requested someone from [facility] contact him for estimate and upfront payment requirement, [name] provided verbal permission for this CM to share his name and contact information with [facility].   Selected [facility] in [place] and notified Tammy and Montie of acceptance. Also provided David's name and contact information for co-pay requirements.  Expected Discharge Plan: Skilled Nursing Facility Barriers to Discharge: Continued Medical Work up               Expected Discharge Plan and Services   Discharge Planning Services: CM Consult Post Acute Care Choice: Skilled Nursing Facility Living arrangements for the past 2 months: Apartment                                       Social Drivers of Health (SDOH) Interventions SDOH Screenings   Food Insecurity: Patient Unable To Answer (05/14/2024)  Housing: Low Risk  (05/05/2024)   Received from [facility]  Transportation Needs: No Transportation Needs (04/06/2024)   Received from [facility]  Utilities: Not At Risk (04/01/2024)   Received from [facility]  Financial Resource Strain: Low Risk  (04/06/2024)   [facility] from [facility]  Physical Activity: Insufficiently Active (01/04/2024)   Received from [facility]  Social  Connections: Moderately Isolated (02/29/2024)  Stress: No Stress Concern Present (01/04/2024)   Received from [facility]  Tobacco Use: Medium Risk (05/10/2024)   Received from [facility]  Health Literacy: Inadequate Health Literacy (01/04/2024)   Received from [facility]    Readmission Risk Interventions     No data to display

## 2024-05-15 NOTE — Progress Notes (Signed)
 " Progress Note   Patient: Bryan Howard. FMW:[id number] DOB: [birth year] DOA: 05/13/2024     1 DOS: the patient was seen and examined on 05/15/2024   Brief hospital course: Bryan Howard. is a 89 y.o. year old male with medical history of HTN, HLD, hypothyroidism, and recurrent urinary tract infections presenting to the ED via EMS after son noted him to be confused.  He also had abnormal urine, he was diagnosed with a urinary tract infection.  Started on IV antibiotics. Per patient's son, patient had 5 episodes of UTI in the past 2 months.  He has been mostly in the hospital during the past 2 months.  He had a cystoscopy in [facility], found to have significant debris seen in the bladder.  He was washed out.  After that, he he did not have a UTI for a month. Discussed with Dr. Francisca, recommended referral back to [facility] urology after discharge.  No indication for cystoscopy while inpatient. Urine culture grew Pseudomonas, blood culture negative.  Antibiotics continued on meropenem .   Principal Problem:   Acute encephalopathy Active Problems:   Chronic heart failure with preserved ejection fraction (HFpEF) (HCC)   Hypothyroidism   Hyperlipidemia   History of TIA (transient ischemic attack)   OSA (obstructive sleep apnea)   Essential hypertension   Chronic obstructive pulmonary disease (HCC)   Thrombocytopenia   Pressure injury of skin   Assessment and Plan: Acute metabolic encephalopathy secondary to UTI. Urinary tract infection with recurrent UTI. Discussed with patient son, patient does not have dementia diagnosed in the past.  He had increased confusion over the last few days, he had a abnormal urine, were started on antibiotics for UTI.   Patient had recurrent UTI from bladder debris, etiology unclear.  Son requested a consult from urology here.  I have a consult with Dr. Francisca, who has reviewed the cystoscopy report from [facility], last cystoscopy was clear from the brace.  He recommended  going back to [facility] after discharge. So far urine culture came back with Pseudomonas, blood culture negative.  Continue meropenem . Planning to treat for 10 days with antibiotics. Altered mental status appears to be secondary to infection, patient also had some insomnia.  Added Seroquel . Patient still has some confusion, question if patient has baseline dementia.    Chronic diastolic congestive heart failure. Recent echocardiogram performed 11/25, showed ejection fraction 60 to 65% with grade 2 diastolic dysfunction.  Normal valvular disease. Currently patient does not have any volume overload.  No exacerbation.   Essential hypertension. Blood pressure not elevated while holding blood pressure medicine.   COPD. No exacerbation.   Generalized weakness. Patient has progressive weakness over the last 2 months, worsening at this admission.  Seen by PT/OT, recommended nursing home placement.   Iron  deficient anemia. Reviewed prior labs, patient had mild iron  deficiency, start iron  supplement         Subjective:  Patient has some confusion, deny any shortness of breath.  Physical Exam: Vitals:   05/15/24 0431 05/15/24 0500 05/15/24 0633 05/15/24 0824  BP: (!) 96/57  112/73 125/82  Pulse: 75  68 88  Resp: (!) 24  (!) 24 19  Temp: 98 F (36.7 C)  97.9 F (36.6 C) 98.7 F (37.1 C)  TempSrc: Oral  Axillary   SpO2: 95%  95% 97%  Weight:  66.2 kg     General exam: Appears calm and comfortable  Respiratory system: Clear to auscultation. Respiratory effort normal. Cardiovascular system: S1 &  S2 heard, RRR. No JVD, murmurs, rubs, gallops or clicks. No pedal edema. Gastrointestinal system: Abdomen is nondistended, soft and nontender. No organomegaly or masses felt. Normal bowel sounds heard. Central nervous system: Alert and oriented x2. No focal neurological deficits. Extremities: Symmetric 5 x 5 power. Skin: No rashes, lesions or ulcers Psychiatry: Flat affect.   Data  Reviewed:  Lab results reviewed.  Family Communication: Son updated over the phone.  Disposition: Status is: Inpatient Remains inpatient appropriate because: Severity of disease, IV treatment.     Time spent: 35 minutes  Author: Murvin Mana, MD 05/15/2024 10:44 AM  For on call review [email or web address].    "

## 2024-05-15 NOTE — Progress Notes (Signed)
 Pharmacy Antibiotic Note  Gavyn Ybarra. is a 89 y.o. male admitted on 05/13/2024 with UTI, ESBL.   Pharmacy has been consulted for meropenem  dosing.  Plan: Meropenem  1 gm IV Q8H ordered to start on 1/27 @ 0530.   Weight: 65.6 kg (144 lb 10 oz)  Temp (24hrs), Avg:98.2 F (36.8 C), Min:97.9 F (36.6 C), Max:98.7 F (37.1 C)  Recent Labs  Lab 05/13/24 0913 05/13/24 0914 05/14/24 0557  WBC 13.3*  --  9.3  CREATININE 0.83  --  0.74  LATICACIDVEN  --  0.9  --     Estimated Creatinine Clearance: 54.5 mL/min (by C-G formula based on SCr of 0.74 mg/dL).    Allergies[1]  Antimicrobials this admission:   >>    >>   Dose adjustments this admission:   Microbiology results:  BCx:   UCx:    Sputum:    MRSA PCR:   Thank you for allowing pharmacy to be a part of this patients care.  Evia Goldsmith D 05/15/2024 5:13 AM     [1]  Allergies Allergen Reactions   Captopril Cough   Quinolones Other (See Comments)    AAA patient- avoid if possible

## 2024-05-15 NOTE — Progress Notes (Signed)
 MEWS Progress Note  Patient Details Name: Bryan Howard. MRN: [id number] DOB: 08-18-1932 Today's Date: 05/15/2024   MEWS Flowsheet Documentation:  Assess: MEWS Score Temp: 98 F (36.7 C) BP: (!) 96/57 MAP (mmHg): 69 Pulse Rate: 75 ECG Heart Rate: (!) 102 Resp: (!) 24 Level of Consciousness: Alert SpO2: 95 % O2 Device: Room Air Patient Activity (if Appropriate): In bed Assess: MEWS Score MEWS Temp: 0 MEWS Systolic: 1 MEWS Pulse: 0 MEWS RR: 1 MEWS LOC: 0 MEWS Score: 2 MEWS Score Color: Yellow Assess: SIRS CRITERIA SIRS Temperature : 0 SIRS Respirations : 1 SIRS Pulse: 0 SIRS WBC: 0 SIRS Score Sum : 1 Assess: if the MEWS score is Yellow or Red Were vital signs accurate and taken at a resting state?: Yes Does the patient meet 2 or more of the SIRS criteria?: No MEWS guidelines implemented : Yes, yellow Treat MEWS Interventions: Considered administering scheduled or prn medications/treatments as ordered Take Vital Signs Increase Vital Sign Frequency : Yellow: Q2hr x1, continue Q4hrs until patient remains green for 12hrs Escalate MEWS: Escalate: Yellow: Discuss with charge nurse and consider notifying provider and/or RRT Notify: Charge Nurse/RN Name of Charge Nurse/RN Notified: Ronal, RN      Hermine Feria Leonie JAYSON Fuller 05/15/2024, 4:35 AM

## 2024-05-15 NOTE — Plan of Care (Signed)
  Problem: Education: Goal: Knowledge of General Education information will improve Description: Including pain rating scale, medication(s)/side effects and non-pharmacologic comfort measures Outcome: Progressing   Problem: Clinical Measurements: Goal: Ability to maintain clinical measurements within normal limits will improve Outcome: Progressing   Problem: Activity: Goal: Risk for activity intolerance will decrease Outcome: Progressing   Problem: Coping: Goal: Level of anxiety will decrease Outcome: Progressing   Problem: Safety: Goal: Ability to remain free from injury will improve Outcome: Progressing   Problem: Skin Integrity: Goal: Risk for impaired skin integrity will decrease Outcome: Progressing   

## 2024-05-16 DIAGNOSIS — G9340 Encephalopathy, unspecified: Secondary | ICD-10-CM | POA: Diagnosis not present

## 2024-05-16 LAB — CBC
HCT: 29.3 % — ABNORMAL LOW (ref 39.0–52.0)
Hemoglobin: 9.5 g/dL — ABNORMAL LOW (ref 13.0–17.0)
MCH: 33.2 pg (ref 26.0–34.0)
MCHC: 32.4 g/dL (ref 30.0–36.0)
MCV: 102.4 fL — ABNORMAL HIGH (ref 80.0–100.0)
Platelets: 181 10*3/uL (ref 150–400)
RBC: 2.86 MIL/uL — ABNORMAL LOW (ref 4.22–5.81)
RDW: 15.1 % (ref 11.5–15.5)
WBC: 4.3 10*3/uL (ref 4.0–10.5)
nRBC: 0 % (ref 0.0–0.2)

## 2024-05-16 LAB — BASIC METABOLIC PANEL WITH GFR
Anion gap: 8 (ref 5–15)
BUN: 21 mg/dL (ref 8–23)
CO2: 27 mmol/L (ref 22–32)
Calcium: 9.1 mg/dL (ref 8.9–10.3)
Chloride: 103 mmol/L (ref 98–111)
Creatinine, Ser: 0.74 mg/dL (ref 0.61–1.24)
GFR, Estimated: >60 mL/min
Glucose, Bld: 103 mg/dL — ABNORMAL HIGH (ref 70–99)
Potassium: 4.2 mmol/L (ref 3.5–5.1)
Sodium: 138 mmol/L (ref 135–145)

## 2024-05-16 LAB — GLUCOSE, CAPILLARY: Glucose-Capillary: 89 mg/dL (ref 70–99)

## 2024-05-16 LAB — PHOSPHORUS: Phosphorus: 2.1 mg/dL — ABNORMAL LOW (ref 2.5–4.6)

## 2024-05-16 LAB — MAGNESIUM: Magnesium: 2 mg/dL (ref 1.7–2.4)

## 2024-05-16 MED ORDER — POTASSIUM PHOSPHATES 15 MMOLE/5ML IV SOLN
30.0000 mmol | Freq: Once | INTRAVENOUS | Status: AC
  Administered 2024-05-16: 30 mmol via INTRAVENOUS
  Filled 2024-05-16: qty 10

## 2024-05-16 MED ORDER — QUETIAPINE FUMARATE 25 MG PO TABS
12.5000 mg | ORAL_TABLET | Freq: Every day | ORAL | Status: DC
  Administered 2024-05-16 – 2024-05-18 (×3): 12.5 mg via ORAL
  Filled 2024-05-16 (×3): qty 1

## 2024-05-16 MED ORDER — DEXTROSE-SODIUM CHLORIDE 5-0.9 % IV SOLN: INTRAVENOUS | Status: AC

## 2024-05-16 NOTE — Progress Notes (Signed)
 Occupational Therapy Treatment Patient Details Name: Bryan Howard. MRN: [id number] DOB: [birth year] Today's Date: 05/16/2024   History of present illness This is an 89 year old Caucasian male with a past medical history of recurrent UTIs, AAA, CAD, diverticulosis, esophageal stricture, GERD, hyperlipidemia, hyperthyroidism and hypothyroidism presenting to the ED via EMS after son noted him to be confused.   OT comments  Upon entering the room, pt supine in bed attempting to eat breakfast but appears to have quite a bit of spillage on hospital gown. Pt is agreeable to co-treatment with OT and PT. Mod A of 2 to come to EOB and use of stedy to stand from bed with +2 assist. Pt assisted to recliner chair with use of stedy and pt stands in same manner from elevated stedy surface and then down into recliner chair. Call bell and all needed items within reach. Breakfast tray set up in front of him and pt able to self feed much better in chair.       If plan is discharge home, recommend the following:  A lot of help with bathing/dressing/bathroom;A lot of help with walking and/or transfers;Assistance with cooking/housework;Assist for transportation;Help with stairs or ramp for entrance;Direct supervision/assist for medications management;Supervision due to cognitive status;Direct supervision/assist for financial management   Equipment Recommendations  Other (comment) (defer)       Precautions / Restrictions Precautions Precautions: Fall Recall of Precautions/Restrictions: Impaired       Mobility Bed Mobility Overal bed mobility: Needs Assistance Bed Mobility: Supine to Sit     Supine to sit: Mod assist, +2 for safety/equipment, HOB elevated, Used rails          Transfers Overall transfer level: Needs assistance   Transfers: Sit to/from Stand             General transfer comment: mod A of 2 Transfer via Lift Equipment: Stedy   Balance Overall balance assessment: Needs  assistance Sitting-balance support: Feet supported Sitting balance-Leahy Scale: Fair     Standing balance support: Reliant on assistive device for balance, Bilateral upper extremity supported, During functional activity                               ADL either performed or assessed with clinical judgement   ADL Overall ADL's : Needs assistance/impaired Eating/Feeding: Set up;Sitting                                          Extremity/Trunk Assessment Upper Extremity Assessment Upper Extremity Assessment: Generalized weakness   Lower Extremity Assessment Lower Extremity Assessment: Generalized weakness        Vision Patient Visual Report: No change from baseline           Communication Communication Communication: Impaired Factors Affecting Communication: Hearing impaired   Cognition Arousal: Alert Behavior During Therapy: WFL for tasks assessed/performed Cognition: No family/caregiver present to determine baseline, Cognition impaired   Orientation impairments: Place, Time, Situation Awareness: Intellectual awareness impaired, Online awareness impaired     Executive functioning impairment (select all impairments): Initiation, Organization, Sequencing, Reasoning, Problem solving                   Following commands: Impaired Following commands impaired: Follows one step commands with increased time      Cueing   Cueing Techniques: Verbal cues, Gestural cues, Tactile  cues             Pertinent Vitals/ Pain       Pain Assessment Pain Assessment: Faces Faces Pain Scale: Hurts a little bit Pain Location: generalized Pain Descriptors / Indicators: Discomfort Pain Intervention(s): Monitored during session         Frequency  Min 2X/week        Progress Toward Goals  OT Goals(current goals can now be found in the care plan section)  Progress towards OT goals: Progressing toward goals         Co-evaluation     PT/OT/SLP Co-Evaluation/Treatment: Yes Reason for Co-Treatment: For patient/therapist safety PT goals addressed during session: Mobility/safety with mobility OT goals addressed during session: ADL's and self-care      AM-PAC OT 6 Clicks Daily Activity     Outcome Measure   Help from another person eating meals?: A Little Help from another person taking care of personal grooming?: A Lot Help from another person toileting, which includes using toliet, bedpan, or urinal?: Total Help from another person bathing (including washing, rinsing, drying)?: Total Help from another person to put on and taking off regular upper body clothing?: A Lot Help from another person to put on and taking off regular lower body clothing?: Total 6 Click Score: 10    End of Session Equipment Utilized During Treatment: Rolling walker (2 wheels)  OT Visit Diagnosis: Other abnormalities of gait and mobility (R26.89);Muscle weakness (generalized) (M62.81);Other symptoms and signs involving cognitive function   Activity Tolerance Patient limited by fatigue   Patient Left with call bell/phone within reach;in chair;with chair alarm set   Nurse Communication Mobility status (use of stedy to return back to bed when ready)        Time: 1047-1110 OT Time Calculation (min): 23 min  Charges: OT General Charges $OT Visit: 1 Visit OT Treatments $Self Care/Home Management : 8-22 mins  Izetta Claude, MS, OTR/L , CBIS ascom [phone]  05/16/24, 12:51 PM

## 2024-05-16 NOTE — TOC Progression Note (Signed)
 Transition of Care (TOC) - Progression Note    Patient Details  Name: Bryan Howard. MRN: [id number] Date of Birth: 1932/12/14  Transition of Care [facility]) CM/SW Contact  Grayce JAYSON Perfect, RN Phone Number: 05/16/2024, 1:37 PM  Clinical Narrative:     RNCM met with patient in his room.  He is agreeable for short term rehab. Son not present in room.  RNCM will continue to follow until discharge.  [facility] offered bed and contacting son regarding possible co-pays they may need up front.   Expected Discharge Plan: Skilled Nursing Facility Barriers to Discharge: Continued Medical Work up               Expected Discharge Plan and Services   Discharge Planning Services: CM Consult Post Acute Care Choice: Skilled Nursing Facility Living arrangements for the past 2 months: Apartment                                       Social Drivers of Health (SDOH) Interventions SDOH Screenings   Food Insecurity: Patient Unable To Answer (05/14/2024)  Housing: Low Risk  (05/05/2024)   Received from [facility]  Transportation Needs: No Transportation Needs (04/06/2024)   Received from [facility]  Utilities: Not At Risk (04/01/2024)   Received from [facility]  Financial Resource Strain: Low Risk  (04/06/2024)   Received from [facility]  Physical Activity: Insufficiently Active (01/04/2024)   Received from [facility]  Social Connections: Moderately Isolated (02/29/2024)  Stress: No Stress Concern Present (01/04/2024)   Received from [facility]  Tobacco Use: Medium Risk (05/10/2024)   Received from [facility]  Health Literacy: Inadequate Health Literacy (01/04/2024)   Received from [facility]    Readmission Risk Interventions     No data to display

## 2024-05-16 NOTE — Plan of Care (Signed)
   Problem: Clinical Measurements: Goal: Ability to maintain clinical measurements within normal limits will improve Outcome: Progressing

## 2024-05-16 NOTE — Progress Notes (Signed)
 " Progress Note   Patient: Bryan Howard. FMW:[id number] DOB: 06-28-1932 DOA: 05/13/2024     2 DOS: the patient was seen and examined on 05/16/2024   Brief hospital course: Bryan Howard. is a 89 y.o. year old male with medical history of HTN, HLD, hypothyroidism, and recurrent urinary tract infections presenting to the ED via EMS after son noted him to be confused.  He also had abnormal urine, he was diagnosed with a urinary tract infection.  Started on IV antibiotics. Per patient's son, patient had 5 episodes of UTI in the past 2 months.  He has been mostly in the hospital during the past 2 months.  He had a cystoscopy in [facility], found to have significant debris seen in the bladder.  He was washed out.  After that, he he did not have a UTI for a month. Discussed with Dr. Francisca, recommended referral back to [facility] urology after discharge.  No indication for cystoscopy while inpatient. Urine culture grew Pseudomonas, blood culture negative.  Antibiotics continued on meropenem .   Principal Problem:   Acute encephalopathy Active Problems:   Chronic heart failure with preserved ejection fraction (HFpEF) (HCC)   Hypothyroidism   Hyperlipidemia   History of TIA (transient ischemic attack)   OSA (obstructive sleep apnea)   Essential hypertension   Chronic obstructive pulmonary disease (HCC)   Thrombocytopenia   Pressure injury of skin   Assessment and Plan:  # Acute metabolic encephalopathy secondary to UTI. # Urinary tract infection with recurrent UTI. Discussed with patient son, patient does not have dementia diagnosed in the past.  He had increased confusion over the last few days, he had a abnormal urine, were started on antibiotics for UTI.   Patient had recurrent UTI from bladder debris, etiology unclear.  Son requested a consult from urology here.  I have a consult with Dr. Francisca, who has reviewed the cystoscopy report from [facility], last cystoscopy was clear from the brace.  He  recommended going back to [facility] after discharge. Urine culture growing Pseudomonas blood culture negative.  Continue meropenem . Planning to treat for 10 days with antibiotics. Altered mental status appears to be secondary to infection, patient also had some insomnia.  Added Seroquel . Patient still has some confusion, question if patient has baseline dementia.  1/28 patient was drowsy and dozing off, decrease Seroquel  12.5 mg p.o. nightly Patient's son is requesting neurology consult for second opinion for prophylactic antibiotics on discharge due to recurrent UTI   Chronic diastolic congestive heart failure. Recent echocardiogram performed 11/25, showed ejection fraction 60 to 65% with grade 2 diastolic dysfunction.  Normal valvular disease. Currently patient does not have any volume overload.  No exacerbation.   Essential hypertension. Blood pressure not elevated while holding blood pressure medicine.   COPD: No exacerbation.   Generalized weakness: Patient has progressive weakness over the last 2 months, worsening at this admission.  Seen by PT/OT, recommended nursing home placement.   Iron  deficient anemia:  Reviewed prior labs, patient had mild iron  deficiency, start iron  supplement   Hypophosphatemia, due to nutritional deficiency Phos repleted. Monitor electrolytes and replete as needed.    Active Pressure Injury/Wound(s)     None          Subjective:  No significant events overnight.  Patient was sleepy and dozing off.  Patient was lethargic in the morning but later more awake and alert during my exam, AO x 2 (place and person)     Physical Exam: Vitals:  05/16/24 0010 05/16/24 0428 05/16/24 0840 05/16/24 1257  BP: 128/86 134/74 120/62 135/75  Pulse: 89 72 63 71  Resp: 18 17 18 18   Temp: 98 F (36.7 C) (!) 97.5 F (36.4 C) (!) 97.4 F (36.3 C) 98 F (36.7 C)  TempSrc: Oral Oral    SpO2: 93% 94% 95% 97%  Weight:  67.7 kg     General exam: Appears calm  and comfortable  Respiratory system: Clear to auscultation. Respiratory effort normal. Cardiovascular system: S1 & S2 heard, RRR. No JVD, murmurs Gastrointestinal system: Abdomen is nondistended, soft and nontender. Normal bowel sounds present. Central nervous system: Alert and oriented x2. No focal neurological deficits. Extremities: No significant edema Skin: No rashes, lesions or ulcers Psychiatry: Flat affect.   Data Reviewed:  Lab results reviewed.  Family Communication: Son updated over the phone.  Disposition: Status is: Inpatient Remains inpatient appropriate because: Severity of disease, IV treatment.     Time spent: 35 minutes  Author: Elvan Sor, MD 05/16/2024 4:57 PM  For on call review [email or web address].    "

## 2024-05-17 DIAGNOSIS — G9340 Encephalopathy, unspecified: Secondary | ICD-10-CM | POA: Diagnosis not present

## 2024-05-17 LAB — BASIC METABOLIC PANEL WITH GFR
Anion gap: 8 (ref 5–15)
BUN: 19 mg/dL (ref 8–23)
CO2: 27 mmol/L (ref 22–32)
Calcium: 9.4 mg/dL (ref 8.9–10.3)
Chloride: 105 mmol/L (ref 98–111)
Creatinine, Ser: 0.83 mg/dL (ref 0.61–1.24)
GFR, Estimated: >60 mL/min
Glucose, Bld: 97 mg/dL (ref 70–99)
Potassium: 3.9 mmol/L (ref 3.5–5.1)
Sodium: 140 mmol/L (ref 135–145)

## 2024-05-17 LAB — CBC
HCT: 32 % — ABNORMAL LOW (ref 39.0–52.0)
Hemoglobin: 10.3 g/dL — ABNORMAL LOW (ref 13.0–17.0)
MCH: 33.6 pg (ref 26.0–34.0)
MCHC: 32.2 g/dL (ref 30.0–36.0)
MCV: 104.2 fL — ABNORMAL HIGH (ref 80.0–100.0)
Platelets: 172 10*3/uL (ref 150–400)
RBC: 3.07 MIL/uL — ABNORMAL LOW (ref 4.22–5.81)
RDW: 14.9 % (ref 11.5–15.5)
WBC: 4.8 10*3/uL (ref 4.0–10.5)
nRBC: 0 % (ref 0.0–0.2)

## 2024-05-17 LAB — PHOSPHORUS: Phosphorus: 2.9 mg/dL (ref 2.5–4.6)

## 2024-05-17 LAB — GLUCOSE, CAPILLARY: Glucose-Capillary: 105 mg/dL — ABNORMAL HIGH (ref 70–99)

## 2024-05-17 LAB — MAGNESIUM: Magnesium: 2.1 mg/dL (ref 1.7–2.4)

## 2024-05-17 NOTE — Progress Notes (Signed)
 Assist of 2 to stand in [place] transfer device from bed. Decreased active B shoulder flexion requiring assist to  reach for bar to self assist up  to standing. Once standing, pt able to stand upright in device, while transferred to bedside chair. Pt does not appear strong enough this date to perform side stepping with RW without potential fall or buckling. Will continue to progress. Initial recs for STR remain appropriate.   05/16/24 1300  PT Visit Information  Assistance Needed +2  Reason for Co-Treatment For patient/therapist safety  PT goals addressed during session Mobility/safety with mobility  OT goals addressed during session ADL's and self-care  History of Present Illness This is an 89 year old Caucasian male with a past medical history of recurrent UTIs, AAA, CAD, diverticulosis, esophageal stricture, GERD, hyperlipidemia, hyperthyroidism and hypothyroidism presenting to the ED via EMS after son noted him to be confused.  Precautions  Precautions Fall  Recall of Precautions/Restrictions Impaired  Restrictions  Weight Bearing Restrictions Per Provider Order No  Pain Assessment  Pain Assessment Faces  Faces Pain Scale 2  Pain Location generalized  Pain Descriptors / Indicators Discomfort  Pain Intervention(s) Limited activity within patient's tolerance  Cognition  Arousal Alert  Behavior During Therapy [facility] for tasks assessed/performed  Following Commands  Following commands Impaired  Following commands impaired Follows one step commands with increased time  Cueing  Cueing Techniques Verbal cues;Gestural cues;Tactile cues  Communication  Communication Impaired  Factors Affecting Communication Hearing impaired  Bed Mobility  Overal bed mobility Needs Assistance  Bed Mobility Supine to Sit  Supine to sit Mod assist;+2 for safety/equipment;HOB elevated;Used rails  General bed mobility comments vc for sequencing  Transfers  Overall transfer level Needs assistance  Equipment used  Ambulation equipment used  Transfers Sit to/from Stand  Sit to Stand Mod assist;+2 physical assistance  Transfer via Lift Equipment Stedy  General transfer comment mod A of 2  Ambulation/Gait  General Gait Details unable  Balance  Overall balance assessment Needs assistance  Sitting-balance support Feet supported  Sitting balance-Leahy Scale Fair  Standing balance support Reliant on assistive device for balance;Bilateral upper extremity supported;During functional activity  Standing balance-Leahy Scale Poor  PT - End of Session  Equipment Utilized During Treatment Gait belt  Activity Tolerance Patient tolerated treatment well  Patient left in chair;with call bell/phone within reach;with chair alarm set  Nurse Communication Mobility status   PT - Assessment/Plan  PT Visit Diagnosis Other abnormalities of gait and mobility (R26.89);Difficulty in walking, not elsewhere classified (R26.2);Muscle weakness (generalized) (M62.81)  PT Frequency (ACUTE ONLY) Min 2X/week  Follow Up Recommendations Skilled nursing-short term rehab (<3 hours/day)  Can patient physically be transported by private vehicle No  Patient can return home with the following Two people to help with walking and/or transfers;A lot of help with bathing/dressing/bathroom;Assistance with cooking/housework;Assist for transportation;Help with stairs or ramp for entrance  PT equipment None recommended by PT  AM-PAC PT 6 Clicks Mobility Outcome Measure (Version 2)  Help needed turning from your back to your side while in a flat bed without using bedrails? 2  Help needed moving from lying on your back to sitting on the side of a flat bed without using bedrails? 2  Help needed moving to and from a bed to a chair (including a wheelchair)? 2  Help needed standing up from a chair using your arms (e.g., wheelchair or bedside chair)? 2  Help needed to walk in hospital room? 1  Help needed climbing 3-5 steps with  a railing?  1  6 Click  Score 10  Consider Recommendation of Discharge To: CIR/SNF/LTACH  Progressive Mobility  What is the highest level of mobility based on the mobility assessment? Level 3 (Stands with assistance) - Balance while standing  and cannot march in place  Activity Pivoted/transferred from bed to chair;Stood with assistance  PT Time Calculation  PT Start Time (ACUTE ONLY) 1047  PT Stop Time (ACUTE ONLY) 1110  PT Time Calculation (min) (ACUTE ONLY) 23 min  PT General Charges  $$ ACUTE PT VISIT 1 Visit  PT Treatments  $Therapeutic Activity 23-37 mins  Darice Bohr, PTA

## 2024-05-17 NOTE — Progress Notes (Signed)
 " Progress Note   Patient: Bryan Howard. FMW:[id number] DOB: October 24, 1932 DOA: 05/13/2024     3 DOS: the patient was seen and examined on 05/17/2024   Brief hospital course: [name]. is a 89 y.o. year old male with medical history of HTN, HLD, hypothyroidism, and recurrent urinary tract infections presenting to the ED via EMS after son noted him to be confused.  He also had abnormal urine, he was diagnosed with a urinary tract infection.  Started on IV antibiotics. Per patient's son, patient had 5 episodes of UTI in the past 2 months.  He has been mostly in the hospital during the past 2 months.  He had a cystoscopy in [facility], found to have significant debris seen in the bladder.  He was washed out.  After that, he he did not have a UTI for a month. Discussed with Dr. Francisca, recommended referral back to [facility] urology after discharge.  No indication for cystoscopy while inpatient. Urine culture grew Pseudomonas, blood culture negative.  Antibiotics continued on meropenem .   Principal Problem:   Acute encephalopathy Active Problems:   Chronic heart failure with preserved ejection fraction (HFpEF) (HCC)   Hypothyroidism   Hyperlipidemia   History of TIA (transient ischemic attack)   OSA (obstructive sleep apnea)   Essential hypertension   Chronic obstructive pulmonary disease (HCC)   Thrombocytopenia   Pressure injury of skin   Assessment and Plan:  # Acute metabolic encephalopathy secondary to UTI. # Urinary tract infection with recurrent UTI. Discussed with patient son, patient does not have dementia diagnosed in the past.  He had increased confusion over the last few days, he had a abnormal urine, were started on antibiotics for UTI.   Patient had recurrent UTI from bladder debris, etiology unclear.  Son requested a consult from urology here.  I have a consult with Dr. Francisca, who has reviewed the cystoscopy report from [facility], last cystoscopy was clear from the brace.  He  recommended going back to [facility] after discharge. Urine culture growing Pseudomonas blood culture negative.  Continue meropenem . Planning to treat for 10 days with antibiotics. Altered mental status appears to be secondary to infection, patient also had some insomnia.  Added Seroquel . Patient still has some confusion, question if patient has baseline dementia.  1/28 patient was drowsy and dozing off, decrease Seroquel  12.5 mg p.o. nightly. Patient's son requested urology consult for second opinion for prophylactic antibiotics on discharge due to recurrent UTI 1/29 d/w Dr. Penne, recommended to follow-up as an outpatient and continue methenamine  for prophylaxis for now   Chronic diastolic congestive heart failure. Recent echocardiogram performed 11/25, showed ejection fraction 60 to 65% with grade 2 diastolic dysfunction.  Normal valvular disease. Currently patient does not have any volume overload.  No exacerbation.    Essential hypertension. Blood pressure not elevated while holding blood pressure medicine.   COPD: No exacerbation.   Generalized weakness: Patient has progressive weakness over the last 2 months, worsening at this admission.  Seen by PT/OT, recommended nursing home placement.   Iron  deficient anemia:  Reviewed prior labs, patient had mild iron  deficiency, start iron  supplement   Hypophosphatemia, due to nutritional deficiency Phos repleted.  Resolved Monitor electrolytes and replete as needed.    Active Pressure Injury/Wound(s)     None          Subjective:  No significant events overnight.  Patient was resting only, more awake and alert today.  Patient stated that he is feeling  well standing.  Denied any active issues.    Physical Exam: Vitals:   05/17/24 0457 05/17/24 0833 05/17/24 1255 05/17/24 1709  BP: 136/84 138/81 104/61 133/76  Pulse: 83 85 (!) 107 89  Resp: 17 16 20 18   Temp: 98.4 F (36.9 C) 98.3 F (36.8 C) 98.8 F (37.1 C) 98.5 F  (36.9 C)  TempSrc: Oral Oral    SpO2: 97% 98% 92% 94%  Weight:       General exam: Appears calm and comfortable  Respiratory system: Clear to auscultation. Respiratory effort normal. Cardiovascular system: S1 & S2 heard, RRR. No JVD, murmurs Gastrointestinal system: Abdomen is nondistended, soft and nontender. Normal bowel sounds present. Central nervous system: Alert and oriented x2. No focal neurological deficits. Extremities: No significant edema Skin: No rashes, lesions or ulcers Psychiatry: Flat affect.   Data Reviewed:  Lab results reviewed.  Family Communication: d/w his Son at bedside   Disposition: Status is: Inpatient Remains inpatient appropriate because: Severity of disease, IV treatment. Stable to discharge to SNF when bed will be available. Follow TOC for dispo plan     Time spent: 35 minutes  Author: Elvan Sor, MD 05/17/2024 6:51 PM  For on call review [email or web address]    "

## 2024-05-17 NOTE — Progress Notes (Signed)
 IV Ceftazidime  found infiltrated.  [name], [facility] in pharmacy notified with recommendation to maintain extremity elevated with PIV removal.  Dr Von notified.

## 2024-05-17 NOTE — Care Management Important Message (Signed)
 Important Message  Patient Details  Name: Bryan Howard. MRN: [id number] Date of Birth: 20-Nov-1932   Important Message Given:  Yes - Medicare IM     [name] 05/17/2024, 12:17 PM

## 2024-05-18 DIAGNOSIS — G9340 Encephalopathy, unspecified: Secondary | ICD-10-CM | POA: Diagnosis not present

## 2024-05-18 LAB — CULTURE, BLOOD (ROUTINE X 2)
Culture: NO GROWTH
Culture: NO GROWTH
Special Requests: ADEQUATE

## 2024-05-18 LAB — GLUCOSE, CAPILLARY
Glucose-Capillary: 136 mg/dL — ABNORMAL HIGH (ref 70–99)
Glucose-Capillary: 95 mg/dL (ref 70–99)

## 2024-05-18 MED ORDER — TOBRAMYCIN SULFATE 80 MG/2ML IJ SOLN
5.0000 mg/kg | Freq: Once | INTRAVENOUS | Status: AC
  Administered 2024-05-18: 340 mg via INTRAVENOUS
  Filled 2024-05-18: qty 8.5

## 2024-05-18 MED ORDER — POLYSACCHARIDE IRON COMPLEX 150 MG PO CAPS: 150.0000 mg | ORAL_CAPSULE | Freq: Every day | ORAL | Status: AC

## 2024-05-18 MED ORDER — QUETIAPINE FUMARATE 25 MG PO TABS: 12.5000 mg | ORAL_TABLET | Freq: Every day | ORAL | Status: AC

## 2024-05-18 MED ORDER — HYDROCODONE-ACETAMINOPHEN 7.5-325 MG PO TABS: 1.0000 | ORAL_TABLET | Freq: Two times a day (BID) | ORAL | 0 refills | Status: AC | PRN

## 2024-05-18 NOTE — Plan of Care (Signed)

## 2024-05-18 NOTE — Discharge Summary (Signed)
 [facility] Discharge Summary   Patient: Bryan Howard. FMW:[id number]  PCP: Jyl Railing, MD  Date of admission: 05/13/2024   Date of discharge:  05/18/2024     Discharge Diagnoses:  Principal Problem:   Acute encephalopathy Active Problems:   Chronic heart failure with preserved ejection fraction (HFpEF) (HCC)   Hypothyroidism   Hyperlipidemia   History of TIA (transient ischemic attack)   OSA (obstructive sleep apnea)   Essential hypertension   Chronic obstructive pulmonary disease (HCC)   Thrombocytopenia   Pressure injury of skin   Admitted From: Home Disposition:  SNF   Recommendations for Outpatient Follow-up:  PCP: Follow-up with PCP, need to be seen by an MD in 1 to 2 days. Follow up LABS/TEST:     Contact information for follow-up providers     Jyl Railing, MD Follow up.   Specialty: Family Medicine Why: hospital follow up Contact information: [address] [place] [address] [phone]              Contact information for after-discharge care     Destination     [facility] [place], [place]. [place]   Service: Skilled Nursing Contact information: [address] [place]  [phone]                    Diet recommendation: Regular diet  Activity: The patient is advised to gradually reintroduce usual activities, as tolerated  Discharge Condition: stable  Code Status: DNR-limited  History of present illness: As per the H and P dictated on admission.  Hospital Course:  From H&P Bryan Howard. is a 89 y.o. year old male with medical history of HTN, HLD, hypothyroidism, and recurrent urinary tract infections presenting to the ED via EMS after son noted him to be confused.  He also had abnormal urine, he was diagnosed with a urinary tract infection.  Started on IV antibiotics. Per patient's son, patient had 5 episodes of UTI in the past 2 months.  He has been mostly in the hospital  during the past 2 months.  He had a cystoscopy in [facility], found to have significant debris seen in the bladder.  He was washed out.  After that, he he did not have a UTI for a month. Discussed with Dr. Francisca, recommended referral back to [facility] urology after discharge.  No indication for cystoscopy while inpatient. Urine culture grew Pseudomonas, blood culture negative.  Antibiotics continued on meropenem .    Assessment and Plan:   # Acute metabolic encephalopathy secondary to UTI. # Urinary tract infection with recurrent UTI. Discussed with patient son, patient does not have dementia diagnosed in the past.  He had increased confusion over the last few days, he had a abnormal urine, were started on antibiotics for UTI.   Patient had recurrent UTI from bladder debris, etiology unclear.  Son requested a consult from urology here.  I have a consult with Dr. Francisca, who has reviewed the cystoscopy report from [facility], last cystoscopy was clear from the brace.  He recommended going back to [facility] after discharge. S/p cefepime  and meropenem , switched to ceftazidime  after urine culture Urine culture growing Pseudomonas. blood culture negative.  Altered mental status appears to be secondary to infection, patient also had some insomnia.  Added Seroquel . Patient still has some confusion, question if patient has baseline dementia.  1/28 patient was drowsy and dozing off, decrease Seroquel  12.5 mg p.o. nightly. Patient's son requested urology consult for second opinion  for prophylactic antibiotics on discharge due to recurrent UTI 1/29 d/w Dr. Penne, recommended to follow-up as an outpatient and continue methenamine  for prophylaxis for now. 1/30 ID consulted, recommended to give 1 dose of tobramycin  for extended few days coverage before discharge, no more need of antibiotics.  Follow-up with urology as an outpatient.    # Chronic diastolic congestive heart failure. Recent echocardiogram performed 11/25, showed  ejection fraction 60 to 65% with grade 2 diastolic dysfunction.  Normal valvular disease. Currently patient does not have any volume overload.  No exacerbation.   # Essential hypertension: Blood pressure stable without antihypertensive medications.  Continue to hold off antihypertensives for now, monitor BP and titrate medications accordingly  # COPD: No exacerbation. # Iron  deficient anemia: Continue oral iron  supplement with vitamin C.  Repeat iron  profile in between 3 to 6 months.  # Hypophosphatemia, due to nutritional deficiency Phos repleted.  Resolved   # Generalized weakness: Patient has progressive weakness over the last 2 months, worsening at this admission.  Seen by PT/OT, recommended nursing home placement.   Body mass index is 25.02 kg/m.  Nutrition Interventions:  Wound 05/13/24 0935 Pressure Injury Sacrum Stage 2 -  Partial thickness loss of dermis presenting as a shallow open injury with a red, pink wound bed without slough. (Active)     Pain control  - [place]  Controlled Substance Reporting System database was reviewed. - Norco 7.5-325 mg p.o. twice daily as needed, 10 tablets prescribed, prescription given as per SNF requirement.  - Patient was instructed, not to drive, operate heavy machinery, perform activities at heights, swimming or participation in water activities or provide baby sitting services while on Pain, Sleep and Anxiety Medications; until his outpatient Physician has advised to do so again.  - Also recommended to not to take more than prescribed Pain, Sleep and Anxiety Medications.  Patient was seen by physical therapy, who recommended Therapy, SNF placement, which was arranged. On the day of the discharge the patient's vitals were stable, and no other acute medical condition were reported by patient. the patient was felt safe to be discharge at SNF with Therapy.  Consultants: ID and discussed with urology Procedures: None  Discharge  Exam: General: Appear in no distress, Oral Mucosa Clear, moist. Cardiovascular: S1 and S2 Present, no Murmur, Respiratory: normal respiratory effort, Bilateral Air entry present and no Crackles, no wheezes Abdomen: Bowel Sound present, Soft and no tenderness. Extremities: no Pedal edema, no calf tenderness Neurology: alert and oriented to time, place, and person affect appropriate.  Filed Weights   05/16/24 0428 05/17/24 0409 05/18/24 0344  Weight: 67.7 kg 67.4 kg 68.2 kg   Vitals:   05/18/24 0813 05/18/24 1219  BP: 117/67 120/85  Pulse: 87 89  Resp: 18   Temp: 98.8 F (37.1 C) 98.3 F (36.8 C)  SpO2: 98% 94%    DISCHARGE MEDICATION: Allergies as of 05/18/2024       Reactions   Captopril Cough   Quinolones Other (See Comments)   AAA patient- avoid if possible        Medication List     PAUSE taking these medications    furosemide 40 MG tablet Wait to take this until your doctor or other care provider tells you to start again. Commonly known as: LASIX Take 40 mg by mouth.   losartan 50 MG tablet Wait to take this until your doctor or other care provider tells you to start again. Commonly known as: COZAAR Take 50 mg  by mouth daily.   phenazopyridine 100 MG tablet Wait to take this until your doctor or other care provider tells you to start again. Commonly known as: PYRIDIUM Take 100 mg by mouth 3 (three) times daily as needed.   senna 8.6 MG tablet Wait to take this until your doctor or other care provider tells you to start again. Commonly known as: SENOKOT Take 1 tablet by mouth 2 (two) times daily.       STOP taking these medications    linezolid 600 MG tablet Commonly known as: ZYVOX   miconazole 2 % cream Commonly known as: MICOTIN   naloxone 4 MG/0.1ML Liqd nasal spray kit Commonly known as: NARCAN       TAKE these medications    acetaminophen  650 MG CR tablet Commonly known as: TYLENOL  Take one tablet every morning and two tablets  nightly   Alpha-Lipoic Acid 200 MG Caps Take 200 mg by mouth daily.   ascorbic acid 500 MG tablet Commonly known as: VITAMIN C Take 500 mg by mouth 2 (two) times daily.   atorvastatin  80 MG tablet Commonly known as: LIPITOR Take 80 mg by mouth daily.   celecoxib 200 MG capsule Commonly known as: CELEBREX Take 200 mg by mouth 2 (two) times daily.   Cholecalciferol  25 MCG (1000 UT) tablet Take 1,000 Units by mouth daily.   Coenzyme Q10 100 MG capsule Take 200 mg by mouth.   [employer] VITAMIN B12 1000 MCG tablet Generic drug: cyanocobalamin  Take 1,000 mcg by mouth daily.   DULoxetine  60 MG capsule Commonly known as: CYMBALTA  Take 60 mg by mouth daily. What changed: Another medication with the same name was removed. Continue taking this medication, and follow the directions you see here.   folic acid  800 MCG tablet Commonly known as: FOLVITE  Take 800 mcg by mouth at bedtime.   gabapentin  300 MG capsule Commonly known as: NEURONTIN  Take 300 mg by mouth at bedtime.   HYDROcodone -acetaminophen  7.5-325 MG tablet Commonly known as: NORCO Take 1 tablet by mouth 2 (two) times daily as needed for moderate pain (pain score 4-6) or severe pain (pain score 7-10).   iron  polysaccharides 150 MG capsule Commonly known as: NIFEREX Take 1 capsule (150 mg total) by mouth daily. Start taking on: May 19, 2024   levothyroxine  112 MCG tablet Commonly known as: SYNTHROID  Take 112 mcg by mouth daily before breakfast. What changed: how much to take   methenamine  1 g tablet Commonly known as: HIPREX Take 1 g by mouth 2 (two) times daily with a meal.   omeprazole 20 MG capsule Commonly known as: PRILOSEC Take 20 mg by mouth 2 (two) times daily as needed.   polyethylene glycol 17 g packet Commonly known as: MIRALAX  / GLYCOLAX  Take 17 g by mouth daily as needed for mild constipation or moderate constipation.   QUEtiapine  25 MG tablet Commonly known as: SEROQUEL  Take 0.5 tablets  (12.5 mg total) by mouth daily after supper. Please hold off if patient is sleepy and drowsy   sennosides-docusate sodium  8.6-50 MG tablet Commonly known as: SENOKOT-S Take 1 tablet by mouth daily.       Allergies[1] Discharge Instructions     Call MD for:  difficulty breathing, headache or visual disturbances   Complete by: As directed    Call MD for:  extreme fatigue   Complete by: As directed    Call MD for:  persistant dizziness or light-headedness   Complete by: As directed    Call  MD for:  persistant nausea and vomiting   Complete by: As directed    Call MD for:  severe uncontrolled pain   Complete by: As directed    Call MD for:  temperature >100.4   Complete by: As directed    Increase activity slowly   Complete by: As directed    No wound care   Complete by: As directed        The results of significant diagnostics from this hospitalization (including imaging, microbiology, ancillary and laboratory) are listed below for reference.    Significant Diagnostic Studies: CT ABDOMEN PELVIS W CONTRAST Result Date: 05/13/2024 CLINICAL DATA:  Worsening abdominal pain.  Altered mental status. EXAM: CT ABDOMEN AND PELVIS WITH CONTRAST TECHNIQUE: Multidetector CT imaging of the abdomen and pelvis was performed using the standard protocol following bolus administration of intravenous contrast. RADIATION DOSE REDUCTION: This exam was performed according to the departmental dose-optimization program which includes automated exposure control, adjustment of the mA and/or kV according to patient size and/or use of iterative reconstruction technique. CONTRAST:  OMNIPAQUE  IOHEXOL  300 MG/ML  SOLN COMPARISON:  03/05/2024 FINDINGS: Lower Chest: No acute findings.  Stable bibasilar scarring. Hepatobiliary: No suspicious hepatic masses identified. Multiple gallstones are again seen, but without signs of cholecystitis or biliary dilatation. Mild pneumobilia is seen, consistent with prior  sphincterotomy. Pancreas: Diffuse parenchymal atrophy appears stable. No mass or inflammatory changes. Spleen: Within normal limits in size. Stable 2.7 cm benign-appearing cyst. Adrenals/Urinary Tract: No suspicious masses identified. A few small benign-appearing cysts are again noted (No followup imaging is recommended). A few small less than 5 mm bilateral renal calculi are seen. No evidence of ureteral calculi or hydronephrosis. Stable mild diffuse bladder wall thickening, which may be due to chronic bladder obstruction or cystitis. Stomach/Bowel: No evidence of obstruction, inflammatory process or abnormal fluid collections. Diverticulosis is seen mainly involving the descending and sigmoid colon, however there is no evidence of diverticulitis. Vascular/Lymphatic: No pathologically enlarged lymph nodes. No acute vascular findings. Prior stent graft repair of abdominal aortic aneurysm again seen. Native aneurysm sac measures 3 point 3 cm, without significant change. Reproductive:  No mass or other significant abnormality. Other:  None. Musculoskeletal: No suspicious bone lesions identified. Severe degenerative spondylosis is seen throughout the lower thoracic and the lumbar spine. IMPRESSION: No acute findings. Stable mild diffuse bladder wall thickening, which may be due to chronic bladder outlet obstruction or cystitis. Bilateral nephrolithiasis. No evidence of ureteral calculi or hydronephrosis. Cholelithiasis. No radiographic evidence of cholecystitis. Colonic diverticulosis, without radiographic evidence of diverticulitis. Stable prior stent graft repair of abdominal aortic aneurysm. Electronically Signed   By: Norleen DELENA Kil M.D.   On: 05/13/2024 11:28   CT Head Wo Contrast Result Date: 05/13/2024 EXAM: CT HEAD WITHOUT CONTRAST 05/13/2024 10:53:49 AM TECHNIQUE: CT of the head was performed without the administration of intravenous contrast. Automated exposure control, iterative reconstruction, and/or  weight based adjustment of the mA/kV was utilized to reduce the radiation dose to as low as reasonably achievable. COMPARISON: Head CT 01/27/2024. CLINICAL HISTORY: 89 year old male with 24 hours of altered mental status. FINDINGS: BRAIN AND VENTRICLES: No acute hemorrhage. No evidence of acute infarct. No hydrocephalus. No extra-axial collection. No mass effect or midline shift. Stable brain volume. Patchy and confluent periventricular white matter hypodensity. Gray-white differentiation is stable. 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 No suspicious intracranial vascular hyperdensity. Stable chronic small vessel disease. ORBITS: No gaze deviation. SINUSES: No acute abnormality. SOFT TISSUES AND SKULL: No  acute soft tissue abnormality. No skull fracture. Calcified atherosclerosis at the skull base. IMPRESSION: 1. No acute intracranial abnormality. 2. Stable chronic small vessel disease. Electronically signed by: Helayne Hurst MD 05/13/2024 11:04 AM EST RP Workstation: [id number]   DG Chest 1 View Result Date: 05/13/2024 CLINICAL DATA:  Shortness of breath. EXAM: CHEST  1 VIEW COMPARISON:  Chest CT dated 02/28/2024 FINDINGS: Shallow inspiration. No focal consolidation, pleural effusion, diffuse chronic antral coarsening and bronchitic changes. Stable cardiac silhouette. Atherosclerotic calcification of the aorta. No acute osseous pathology. IMPRESSION: 1. No acute cardiopulmonary process. 2. Chronic bronchitic changes. Electronically Signed   By: Vanetta Chou M.D.   On: 05/13/2024 09:24    Microbiology: Recent Results (from the past 240 hours)  Resp panel by RT-PCR (RSV, Flu A&B, Covid) Anterior Nasal Swab     Status: None   Collection Time: 05/13/24  9:13 AM   Specimen: Anterior Nasal Swab  Result Value Ref Range Status   SARS Coronavirus 2 by RT PCR NEGATIVE NEGATIVE Final    Comment: (NOTE) SARS-CoV-2 target nucleic acids are NOT DETECTED.  The SARS-CoV-2 RNA is generally  detectable in upper respiratory specimens during the acute phase of infection. The lowest concentration of SARS-CoV-2 viral copies this assay can detect is 138 copies/mL. A negative result does not preclude SARS-Cov-2 infection and should not be used as the sole basis for treatment or other patient management decisions. A negative result may occur with  improper specimen collection/handling, submission of specimen other than nasopharyngeal swab, presence of viral mutation(s) within the areas targeted by this assay, and inadequate number of viral copies(<138 copies/mL). A negative result must be combined with clinical observations, patient history, and epidemiological information. The expected result is Negative.  Fact Sheet for Patients:  bloggercourse.com  Fact Sheet for Healthcare Providers:  seriousbroker.it  This test is no t yet approved or cleared by the [place]  FDA and  has been authorized for detection and/or diagnosis of SARS-CoV-2 by FDA under an Emergency Use Authorization (EUA). This EUA will remain  in effect (meaning this test can be used) for the duration of the COVID-19 declaration under Section 564(b)(1) of the Act, 21 U.S.C.section 360bbb-3(b)(1), unless the authorization is terminated  or revoked sooner.       Influenza A by PCR NEGATIVE NEGATIVE Final   Influenza B by PCR NEGATIVE NEGATIVE Final    Comment: (NOTE) The Xpert Xpress SARS-CoV-2/FLU/RSV plus assay is intended as an aid in the diagnosis of influenza from Nasopharyngeal swab specimens and should not be used as a sole basis for treatment. Nasal washings and aspirates are unacceptable for Xpert Xpress SARS-CoV-2/FLU/RSV testing.  Fact Sheet for Patients: bloggercourse.com  Fact Sheet for Healthcare Providers: seriousbroker.it  This test is not yet approved or cleared by the [place]  FDA  and has been authorized for detection and/or diagnosis of SARS-CoV-2 by FDA under an Emergency Use Authorization (EUA). This EUA will remain in effect (meaning this test can be used) for the duration of the COVID-19 declaration under Section 564(b)(1) of the Act, 21 U.S.C. section 360bbb-3(b)(1), unless the authorization is terminated or revoked.     Resp Syncytial Virus by PCR NEGATIVE NEGATIVE Final    Comment: (NOTE) Fact Sheet for Patients: bloggercourse.com  Fact Sheet for Healthcare Providers: seriousbroker.it  This test is not yet approved or cleared by the [place]  FDA and has been authorized for detection and/or diagnosis of SARS-CoV-2 by FDA under an Emergency Use Authorization (EUA). This EUA will remain  in effect (meaning this test can be used) for the duration of the COVID-19 declaration under Section 564(b)(1) of the Act, 21 U.S.C. section 360bbb-3(b)(1), unless the authorization is terminated or revoked.  Performed at [facility], [address] [place], [place] [address]   Urine Culture     Status: Abnormal   Collection Time: 05/13/24 11:33 AM   Specimen: Urine, Clean Catch  Result Value Ref Range Status   Specimen Description   Final    URINE, CLEAN CATCH Performed at [facility], [address], [place], [place] [address]    Special Requests   Final    NONE Performed at [facility], [address] [place], [place] [address]    Culture 50,000 COLONIES/mL PSEUDOMONAS AERUGINOSA (A)  Final   Report Status 05/15/2024 FINAL  Final   Organism ID, Bacteria PSEUDOMONAS AERUGINOSA (A)  Final      Susceptibility   Pseudomonas aeruginosa - MIC*    MEROPENEM  <=0.25 SENSITIVE Sensitive     CIPROFLOXACIN 0.12 SENSITIVE Sensitive     IMIPENEM 2 SENSITIVE Sensitive     PIP/TAZO Value in next row Sensitive      8 SENSITIVEThis is a modified FDA-approved test that has been  validated and its performance characteristics determined by the reporting laboratory.  This laboratory is certified under the Clinical Laboratory Improvement Amendments CLIA as qualified to perform high complexity clinical laboratory testing.    CEFEPIME  Value in next row Sensitive      8 SENSITIVEThis is a modified FDA-approved test that has been validated and its performance characteristics determined by the reporting laboratory.  This laboratory is certified under the Clinical Laboratory Improvement Amendments CLIA as qualified to perform high complexity clinical laboratory testing.    CEFTAZIDIME /AVIBACTAM Value in next row Sensitive      8 SENSITIVEThis is a modified FDA-approved test that has been validated and its performance characteristics determined by the reporting laboratory.  This laboratory is certified under the Clinical Laboratory Improvement Amendments CLIA as qualified to perform high complexity clinical laboratory testing.    CEFTOLOZANE/TAZOBACTAM Value in next row Sensitive      8 SENSITIVEThis is a modified FDA-approved test that has been validated and its performance characteristics determined by the reporting laboratory.  This laboratory is certified under the Clinical Laboratory Improvement Amendments CLIA as qualified to perform high complexity clinical laboratory testing.    TOBRAMYCIN  Value in next row Sensitive      8 SENSITIVEThis is a modified FDA-approved test that has been validated and its performance characteristics determined by the reporting laboratory.  This laboratory is certified under the Clinical Laboratory Improvement Amendments CLIA as qualified to perform high complexity clinical laboratory testing.    CEFTAZIDIME  Value in next row Sensitive      8 SENSITIVEThis is a modified FDA-approved test that has been validated and its performance characteristics determined by the reporting laboratory.  This laboratory is certified under the Clinical Laboratory Improvement  Amendments CLIA as qualified to perform high complexity clinical laboratory testing.    * 50,000 COLONIES/mL PSEUDOMONAS AERUGINOSA  Blood culture (routine x 2)     Status: None   Collection Time: 05/13/24  7:57 PM   Specimen: BLOOD  Result Value Ref Range Status   Specimen Description BLOOD BLOOD RIGHT HAND  Final   Special Requests   Final    BOTTLES DRAWN AEROBIC AND ANAEROBIC Blood Culture adequate volume   Culture   Final    NO GROWTH 5 DAYS  Performed at [facility], [address] [place], [place] [address]    Report Status 05/18/2024 FINAL  Final  Blood culture (routine x 2)     Status: None   Collection Time: 05/13/24  7:57 PM   Specimen: BLOOD  Result Value Ref Range Status   Specimen Description BLOOD LFOA  Final   Special Requests   Final    BOTTLES DRAWN AEROBIC AND ANAEROBIC Blood Culture results may not be optimal due to an excessive volume of blood received in culture bottles   Culture   Final    NO GROWTH 5 DAYS Performed at [facility], [address] [place], [place] [address]    Report Status 05/18/2024 FINAL  Final     Labs: CBC: Recent Labs  Lab 05/13/24 0913 05/14/24 0557 05/15/24 0604 05/16/24 1027 05/17/24 0700  WBC 13.3* 9.3 5.3 4.3 4.8  NEUTROABS 11.3*  --   --   --   --   HGB 10.5* 9.6* 9.5* 9.5* 10.3*  HCT 32.4* 29.2* 29.2* 29.3* 32.0*  MCV 103.2* 100.0 102.5* 102.4* 104.2*  PLT 164 163 145* 181 172   Basic Metabolic Panel: Recent Labs  Lab 05/13/24 0913 05/13/24 1109 05/14/24 0557 05/15/24 0604 05/16/24 1027 05/17/24 0700  NA 138  --  138 138 138 140  K 4.1  --  3.8 3.5 4.2 3.9  CL 101  --  102 103 103 105  CO2 27  --  25 27 27 27   GLUCOSE 115*  --  101* 94 103* 97  BUN 25*  --  22 28* 21 19  CREATININE 0.83  --  0.74 0.75 0.74 0.83  CALCIUM  10.5*  --  9.8 9.7 9.1 9.4  MG  --  1.8  --   --  2.0 2.1  PHOS  --   --   --   --  2.1* 2.9   Liver Function Tests: Recent Labs  Lab 05/13/24 0913  AST 25   ALT 17  ALKPHOS 93  BILITOT 0.8  PROT 7.5  ALBUMIN 3.7   Recent Labs  Lab 05/13/24 0913  LIPASE 12   Recent Labs  Lab 05/13/24 0912  AMMONIA <13   Cardiac Enzymes: No results for input(s): CKTOTAL, CKMB, CKMBINDEX, TROPONINI in the last 168 hours. BNP (last 3 results) Recent Labs    01/27/24 2324  BNP 144.1*   CBG: Recent Labs  Lab 05/14/24 1146 05/15/24 0852 05/16/24 0842 05/17/24 0834 05/18/24 0813  GLUCAP 135* 95 89 105* 136*    Time spent: 35 minutes  Signed:  Elvan Sor  [facility] 05/18/2024 2:23 PM      [1]  Allergies Allergen Reactions   Captopril Cough   Quinolones Other (See Comments)    AAA patient- avoid if possible

## 2024-05-18 NOTE — Plan of Care (Signed)

## 2024-05-18 NOTE — TOC Transition Note (Signed)
 Transition of Care [facility]) - Discharge Note   Patient Details  Name: Bryan Howard. MRN: [id number] Date of Birth: 1932/06/29  Transition of Care [facility]) CM/SW Contact:  Nathanael CHRISTELLA Ring, RN Phone Number: 05/18/2024, 3:41 PM   Clinical Narrative:    Patient is discharging to [facility] today, going to room 804.  Bedside RN will call report to [phone].  CM has arranged [facility] for transport.  Patient is 11th on the list for transport.  Son, [name] called and notified of discharge.  He is going to go ahead and take his dad some belongings over to [facility] and then come see him at the hospital before he goes.   Final next level of care: Skilled Nursing Facility Barriers to Discharge: Barriers Resolved   Patient Goals and CMS Choice   CMS Medicare.gov Compare Post Acute Care list provided to:: Patient Represenative (must comment) Choice offered to / list presented to : Adult Children      Discharge Placement              Patient chooses bed at: [facility] [place] Patient to be transferred to facility by: [facility] Name of family member notified: [name] Patient and family notified of of transfer: 05/18/24  Discharge Plan and Services Additional resources added to the After Visit Summary for     Discharge Planning Services: CM Consult Post Acute Care Choice: Skilled Nursing Facility          DME Arranged: N/A         HH Arranged: NA HH Agency: NA        Social Drivers of Health (SDOH) Interventions SDOH Screenings   Food Insecurity: Patient Unable To Answer (05/14/2024)  Housing: Low Risk  (05/05/2024)   Received from [facility]  Transportation Needs: No Transportation Needs (04/06/2024)   Received from [facility]  Utilities: Not At Risk (04/01/2024)   Received from [facility]  Financial Resource Strain: Low Risk  (04/06/2024)   Received from [facility]  Physical Activity:  Insufficiently Active (01/04/2024)   Received from [facility]  Social Connections: Moderately Isolated (02/29/2024)  Stress: No Stress Concern Present (01/04/2024)   Received from [facility]  Tobacco Use: Medium Risk (05/10/2024)   Received from [facility]  Health Literacy: Inadequate Health Literacy (01/04/2024)   Received from [facility]     Readmission Risk Interventions     No data to display

## 2024-06-07 ENCOUNTER — Ambulatory Visit: Admitting: Podiatry
